# Patient Record
Sex: Male | Born: 1935
Health system: Southern US, Community
[De-identification: ages and names within clinical notes are randomized; demographics above are authoritative.]

## PROBLEM LIST (undated history)

## (undated) DIAGNOSIS — L821 Other seborrheic keratosis: Secondary | ICD-10-CM

## (undated) DIAGNOSIS — D649 Anemia, unspecified: Secondary | ICD-10-CM

## (undated) DIAGNOSIS — C801 Malignant (primary) neoplasm, unspecified: Secondary | ICD-10-CM

## (undated) DIAGNOSIS — D75839 Thrombocytosis, unspecified: Secondary | ICD-10-CM

## (undated) DIAGNOSIS — Z9289 Personal history of other medical treatment: Secondary | ICD-10-CM

## (undated) DIAGNOSIS — F341 Dysthymic disorder: Secondary | ICD-10-CM

## (undated) DIAGNOSIS — R930 Abnormal findings on diagnostic imaging of skull and head, not elsewhere classified: Secondary | ICD-10-CM

## (undated) DIAGNOSIS — D473 Essential (hemorrhagic) thrombocythemia: Secondary | ICD-10-CM

## (undated) DIAGNOSIS — I10 Essential (primary) hypertension: Secondary | ICD-10-CM

## (undated) DIAGNOSIS — T7840XA Allergy, unspecified, initial encounter: Secondary | ICD-10-CM

## (undated) DIAGNOSIS — H3530 Unspecified macular degeneration: Secondary | ICD-10-CM

## (undated) DIAGNOSIS — R519 Headache, unspecified: Secondary | ICD-10-CM

## (undated) HISTORY — PX: VARICOSE VEIN SURGERY: SHX832

## (undated) HISTORY — DX: Thrombocytosis, unspecified: D75.839

## (undated) HISTORY — DX: Anemia, unspecified: D64.9

## (undated) HISTORY — DX: Dysthymic disorder: F34.1

## (undated) HISTORY — PX: TONSILLECTOMY: SUR1361

## (undated) HISTORY — DX: Other seborrheic keratosis: L82.1

## (undated) HISTORY — DX: Abnormal findings on diagnostic imaging of skull and head, not elsewhere classified: R93.0

## (undated) HISTORY — DX: Essential (hemorrhagic) thrombocythemia: D47.3

## (undated) HISTORY — DX: Essential (primary) hypertension: I10

## (undated) HISTORY — DX: Personal history of other medical treatment: Z92.89

## (undated) HISTORY — DX: Allergy, unspecified, initial encounter: T78.40XA

## (undated) HISTORY — PX: EYE SURGERY: SHX253

## (undated) HISTORY — PX: HERNIA REPAIR: SHX51

---

## 1998-02-28 ENCOUNTER — Ambulatory Visit (HOSPITAL_BASED_OUTPATIENT_CLINIC_OR_DEPARTMENT_OTHER): Admission: RE | Admit: 1998-02-28 | Discharge: 1998-02-28 | Payer: Self-pay | Admitting: *Deleted

## 1998-03-05 ENCOUNTER — Ambulatory Visit (HOSPITAL_BASED_OUTPATIENT_CLINIC_OR_DEPARTMENT_OTHER): Admission: RE | Admit: 1998-03-05 | Discharge: 1998-03-05 | Payer: Self-pay | Admitting: *Deleted

## 2002-05-15 ENCOUNTER — Encounter (INDEPENDENT_AMBULATORY_CARE_PROVIDER_SITE_OTHER): Payer: Self-pay

## 2002-05-15 ENCOUNTER — Ambulatory Visit (HOSPITAL_COMMUNITY): Admission: RE | Admit: 2002-05-15 | Discharge: 2002-05-15 | Payer: Self-pay | Admitting: Oncology

## 2003-06-04 ENCOUNTER — Ambulatory Visit (HOSPITAL_COMMUNITY): Admission: RE | Admit: 2003-06-04 | Discharge: 2003-06-04 | Payer: Self-pay | Admitting: Plastic Surgery

## 2003-06-04 ENCOUNTER — Emergency Department (HOSPITAL_COMMUNITY): Admission: EM | Admit: 2003-06-04 | Discharge: 2003-06-04 | Payer: Self-pay | Admitting: Emergency Medicine

## 2003-06-04 ENCOUNTER — Encounter (INDEPENDENT_AMBULATORY_CARE_PROVIDER_SITE_OTHER): Payer: Self-pay | Admitting: Specialist

## 2003-06-04 ENCOUNTER — Ambulatory Visit (HOSPITAL_BASED_OUTPATIENT_CLINIC_OR_DEPARTMENT_OTHER): Admission: RE | Admit: 2003-06-04 | Discharge: 2003-06-04 | Payer: Self-pay | Admitting: Plastic Surgery

## 2004-04-27 ENCOUNTER — Ambulatory Visit: Payer: Self-pay | Admitting: Oncology

## 2004-07-27 ENCOUNTER — Ambulatory Visit: Payer: Self-pay | Admitting: Oncology

## 2004-10-26 ENCOUNTER — Ambulatory Visit: Payer: Self-pay | Admitting: Oncology

## 2005-01-25 ENCOUNTER — Ambulatory Visit: Payer: Self-pay | Admitting: Oncology

## 2005-04-26 ENCOUNTER — Ambulatory Visit: Payer: Self-pay | Admitting: Oncology

## 2005-07-26 ENCOUNTER — Ambulatory Visit: Payer: Self-pay | Admitting: Oncology

## 2005-10-22 ENCOUNTER — Ambulatory Visit: Payer: Self-pay | Admitting: Oncology

## 2005-10-26 LAB — CBC WITH DIFFERENTIAL/PLATELET
Basophils Absolute: 0 10*3/uL (ref 0.0–0.1)
Eosinophils Absolute: 0.1 10*3/uL (ref 0.0–0.5)
HGB: 11.8 g/dL — ABNORMAL LOW (ref 13.0–17.1)
MCV: 94.7 fL (ref 81.6–98.0)
MONO%: 9.3 % (ref 0.0–13.0)
NEUT#: 4.2 10*3/uL (ref 1.5–6.5)
RBC: 3.64 10*6/uL — ABNORMAL LOW (ref 4.20–5.71)
RDW: 17.5 % — ABNORMAL HIGH (ref 11.2–14.6)
WBC: 6.6 10*3/uL (ref 4.0–10.0)
lymph#: 1.7 10*3/uL (ref 0.9–3.3)

## 2006-01-17 ENCOUNTER — Ambulatory Visit: Payer: Self-pay | Admitting: Family Medicine

## 2006-01-20 ENCOUNTER — Ambulatory Visit: Payer: Self-pay | Admitting: Oncology

## 2006-01-25 LAB — CBC WITH DIFFERENTIAL/PLATELET
BASO%: 0.3 % (ref 0.0–2.0)
Basophils Absolute: 0 10*3/uL (ref 0.0–0.1)
EOS%: 1 % (ref 0.0–7.0)
HGB: 12.7 g/dL — ABNORMAL LOW (ref 13.0–17.1)
MCH: 32.7 pg (ref 28.0–33.4)
MCHC: 34.2 g/dL (ref 32.0–35.9)
MCV: 95.7 fL (ref 81.6–98.0)
MONO%: 5.4 % (ref 0.0–13.0)
RBC: 3.89 10*6/uL — ABNORMAL LOW (ref 4.20–5.71)
RDW: 17.7 % — ABNORMAL HIGH (ref 11.2–14.6)
lymph#: 1.9 10*3/uL (ref 0.9–3.3)

## 2006-02-17 ENCOUNTER — Ambulatory Visit: Payer: Self-pay | Admitting: Family Medicine

## 2006-02-26 DIAGNOSIS — Z9289 Personal history of other medical treatment: Secondary | ICD-10-CM

## 2006-02-26 HISTORY — DX: Personal history of other medical treatment: Z92.89

## 2006-04-20 ENCOUNTER — Ambulatory Visit: Payer: Self-pay | Admitting: Family Medicine

## 2006-04-22 ENCOUNTER — Ambulatory Visit: Payer: Self-pay | Admitting: Oncology

## 2006-04-26 LAB — CBC WITH DIFFERENTIAL/PLATELET
BASO%: 0.4 % (ref 0.0–2.0)
Basophils Absolute: 0 10*3/uL (ref 0.0–0.1)
EOS%: 1.5 % (ref 0.0–7.0)
HCT: 36 % — ABNORMAL LOW (ref 38.7–49.9)
HGB: 12.2 g/dL — ABNORMAL LOW (ref 13.0–17.1)
LYMPH%: 20.5 % (ref 14.0–48.0)
MCH: 32.6 pg (ref 28.0–33.4)
MCHC: 33.9 g/dL (ref 32.0–35.9)
MCV: 96.2 fL (ref 81.6–98.0)
MONO%: 8 % (ref 0.0–13.0)
NEUT%: 69.6 % (ref 40.0–75.0)
lymph#: 1.5 10*3/uL (ref 0.9–3.3)

## 2006-07-21 ENCOUNTER — Ambulatory Visit: Payer: Self-pay | Admitting: Oncology

## 2006-07-26 LAB — CBC WITH DIFFERENTIAL/PLATELET
Basophils Absolute: 0 10*3/uL (ref 0.0–0.1)
Eosinophils Absolute: 0.1 10*3/uL (ref 0.0–0.5)
HGB: 12.1 g/dL — ABNORMAL LOW (ref 13.0–17.1)
MCV: 94 fL (ref 81.6–98.0)
MONO#: 0.7 10*3/uL (ref 0.1–0.9)
NEUT#: 5.3 10*3/uL (ref 1.5–6.5)
Platelets: 210 10*3/uL (ref 145–400)
RBC: 3.66 10*6/uL — ABNORMAL LOW (ref 4.20–5.71)
RDW: 17.7 % — ABNORMAL HIGH (ref 11.2–14.6)
WBC: 7.8 10*3/uL (ref 4.0–10.0)

## 2006-07-28 ENCOUNTER — Emergency Department (HOSPITAL_COMMUNITY): Admission: EM | Admit: 2006-07-28 | Discharge: 2006-07-28 | Payer: Self-pay | Admitting: Family Medicine

## 2006-09-08 ENCOUNTER — Ambulatory Visit: Payer: Self-pay | Admitting: Family Medicine

## 2006-10-03 ENCOUNTER — Ambulatory Visit: Payer: Self-pay | Admitting: Family Medicine

## 2006-10-04 DIAGNOSIS — R930 Abnormal findings on diagnostic imaging of skull and head, not elsewhere classified: Secondary | ICD-10-CM

## 2006-10-04 HISTORY — DX: Abnormal findings on diagnostic imaging of skull and head, not elsewhere classified: R93.0

## 2006-10-21 ENCOUNTER — Ambulatory Visit: Payer: Self-pay | Admitting: Oncology

## 2006-10-25 LAB — CBC WITH DIFFERENTIAL/PLATELET
Basophils Absolute: 0 10*3/uL (ref 0.0–0.1)
Eosinophils Absolute: 0.1 10*3/uL (ref 0.0–0.5)
HCT: 33.7 % — ABNORMAL LOW (ref 38.7–49.9)
LYMPH%: 24.5 % (ref 14.0–48.0)
MCV: 95.1 fL (ref 81.6–98.0)
MONO#: 0.5 10*3/uL (ref 0.1–0.9)
MONO%: 7.3 % (ref 0.0–13.0)
NEUT#: 4.8 10*3/uL (ref 1.5–6.5)
NEUT%: 65.6 % (ref 40.0–75.0)
Platelets: 163 10*3/uL (ref 145–400)
WBC: 7.3 10*3/uL (ref 4.0–10.0)

## 2006-12-13 DIAGNOSIS — Z9289 Personal history of other medical treatment: Secondary | ICD-10-CM

## 2006-12-13 HISTORY — DX: Personal history of other medical treatment: Z92.89

## 2006-12-26 ENCOUNTER — Ambulatory Visit: Payer: Self-pay | Admitting: Family Medicine

## 2007-01-04 ENCOUNTER — Ambulatory Visit (HOSPITAL_COMMUNITY): Admission: RE | Admit: 2007-01-04 | Discharge: 2007-01-04 | Payer: Self-pay | Admitting: Family Medicine

## 2007-01-28 ENCOUNTER — Ambulatory Visit: Payer: Self-pay | Admitting: Oncology

## 2007-02-01 LAB — CBC WITH DIFFERENTIAL/PLATELET
BASO%: 0.6 % (ref 0.0–2.0)
EOS%: 2.7 % (ref 0.0–7.0)
HCT: 35.4 % — ABNORMAL LOW (ref 38.7–49.9)
LYMPH%: 24.4 % (ref 14.0–48.0)
MCH: 33.1 pg (ref 28.0–33.4)
MCHC: 35 g/dL (ref 32.0–35.9)
MCV: 94.4 fL (ref 81.6–98.0)
MONO#: 0.6 10*3/uL (ref 0.1–0.9)
MONO%: 8.7 % (ref 0.0–13.0)
NEUT%: 63.6 % (ref 40.0–75.0)
Platelets: 146 10*3/uL (ref 145–400)

## 2007-04-05 ENCOUNTER — Ambulatory Visit: Payer: Self-pay | Admitting: Family Medicine

## 2007-04-21 ENCOUNTER — Ambulatory Visit: Payer: Self-pay | Admitting: Oncology

## 2007-04-25 LAB — CBC WITH DIFFERENTIAL/PLATELET
BASO%: 0.4 % (ref 0.0–2.0)
HCT: 33.7 % — ABNORMAL LOW (ref 38.7–49.9)
MCHC: 34.8 g/dL (ref 32.0–35.9)
MONO#: 0.7 10*3/uL (ref 0.1–0.9)
NEUT%: 64.5 % (ref 40.0–75.0)
WBC: 7.3 10*3/uL (ref 4.0–10.0)
lymph#: 1.8 10*3/uL (ref 0.9–3.3)

## 2007-05-25 LAB — CBC WITH DIFFERENTIAL/PLATELET
Basophils Absolute: 0 10*3/uL (ref 0.0–0.1)
Eosinophils Absolute: 0.1 10*3/uL (ref 0.0–0.5)
HCT: 33 % — ABNORMAL LOW (ref 38.7–49.9)
HGB: 11.5 g/dL — ABNORMAL LOW (ref 13.0–17.1)
MONO#: 0.6 10*3/uL (ref 0.1–0.9)
NEUT#: 5 10*3/uL (ref 1.5–6.5)
NEUT%: 66.5 % (ref 40.0–75.0)
WBC: 7.5 10*3/uL (ref 4.0–10.0)
lymph#: 1.8 10*3/uL (ref 0.9–3.3)

## 2007-06-21 ENCOUNTER — Ambulatory Visit: Payer: Self-pay | Admitting: Oncology

## 2007-06-23 LAB — CBC WITH DIFFERENTIAL/PLATELET
Basophils Absolute: 0 10*3/uL (ref 0.0–0.1)
EOS%: 1.5 % (ref 0.0–7.0)
Eosinophils Absolute: 0.2 10*3/uL (ref 0.0–0.5)
HCT: 36.1 % — ABNORMAL LOW (ref 38.7–49.9)
HGB: 12.3 g/dL — ABNORMAL LOW (ref 13.0–17.1)
MCH: 32 pg (ref 28.0–33.4)
MCV: 94.2 fL (ref 81.6–98.0)
MONO%: 9.5 % (ref 0.0–13.0)
NEUT%: 68.5 % (ref 40.0–75.0)

## 2007-07-03 ENCOUNTER — Ambulatory Visit: Payer: Self-pay | Admitting: Family Medicine

## 2007-07-25 LAB — CBC WITH DIFFERENTIAL/PLATELET
Basophils Absolute: 0 10*3/uL (ref 0.0–0.1)
HCT: 34 % — ABNORMAL LOW (ref 38.7–49.9)
HGB: 11.7 g/dL — ABNORMAL LOW (ref 13.0–17.1)
LYMPH%: 20.5 % (ref 14.0–48.0)
MCHC: 34.5 g/dL (ref 32.0–35.9)
MONO#: 0.7 10*3/uL (ref 0.1–0.9)
NEUT%: 68.5 % (ref 40.0–75.0)
Platelets: 360 10*3/uL (ref 145–400)
WBC: 8.5 10*3/uL (ref 4.0–10.0)
lymph#: 1.7 10*3/uL (ref 0.9–3.3)

## 2007-08-23 ENCOUNTER — Ambulatory Visit: Payer: Self-pay | Admitting: Family Medicine

## 2007-10-20 ENCOUNTER — Ambulatory Visit: Payer: Self-pay | Admitting: Oncology

## 2007-10-24 LAB — CBC WITH DIFFERENTIAL/PLATELET
BASO%: 0.4 % (ref 0.0–2.0)
Eosinophils Absolute: 0.2 10*3/uL (ref 0.0–0.5)
HCT: 34.9 % — ABNORMAL LOW (ref 38.7–49.9)
LYMPH%: 13.2 % — ABNORMAL LOW (ref 14.0–48.0)
MCHC: 34.8 g/dL (ref 32.0–35.9)
MONO#: 0.9 10*3/uL (ref 0.1–0.9)
NEUT#: 9.5 10*3/uL — ABNORMAL HIGH (ref 1.5–6.5)
NEUT%: 77.3 % — ABNORMAL HIGH (ref 40.0–75.0)
Platelets: 421 10*3/uL — ABNORMAL HIGH (ref 145–400)
WBC: 12.3 10*3/uL — ABNORMAL HIGH (ref 4.0–10.0)
lymph#: 1.6 10*3/uL (ref 0.9–3.3)

## 2007-11-16 ENCOUNTER — Ambulatory Visit: Payer: Self-pay | Admitting: Family Medicine

## 2007-11-20 ENCOUNTER — Ambulatory Visit: Payer: Self-pay | Admitting: Family Medicine

## 2008-01-25 ENCOUNTER — Ambulatory Visit: Payer: Self-pay | Admitting: Oncology

## 2008-01-29 LAB — CBC WITH DIFFERENTIAL/PLATELET
Basophils Absolute: 0.1 10*3/uL (ref 0.0–0.1)
EOS%: 0.9 % (ref 0.0–7.0)
Eosinophils Absolute: 0.1 10*3/uL (ref 0.0–0.5)
HCT: 36.5 % — ABNORMAL LOW (ref 38.7–49.9)
HGB: 12.5 g/dL — ABNORMAL LOW (ref 13.0–17.1)
LYMPH%: 19.5 % (ref 14.0–48.0)
MCH: 32.5 pg (ref 28.0–33.4)
MCV: 95.3 fL (ref 81.6–98.0)
MONO%: 9.6 % (ref 0.0–13.0)
NEUT#: 5.8 10*3/uL (ref 1.5–6.5)
NEUT%: 69.4 % (ref 40.0–75.0)
Platelets: 342 10*3/uL (ref 145–400)

## 2008-01-29 LAB — MORPHOLOGY

## 2008-03-26 ENCOUNTER — Ambulatory Visit: Payer: Self-pay | Admitting: Family Medicine

## 2008-04-22 ENCOUNTER — Ambulatory Visit: Payer: Self-pay | Admitting: Oncology

## 2008-04-24 LAB — CBC WITH DIFFERENTIAL/PLATELET
BASO%: 0.7 % (ref 0.0–2.0)
EOS%: 1.8 % (ref 0.0–7.0)
MCH: 33.8 pg — ABNORMAL HIGH (ref 28.0–33.4)
MCHC: 34.8 g/dL (ref 32.0–35.9)
MCV: 97 fL (ref 81.6–98.0)
MONO%: 7.4 % (ref 0.0–13.0)
NEUT#: 5.9 10*3/uL (ref 1.5–6.5)
RBC: 3.79 10*6/uL — ABNORMAL LOW (ref 4.20–5.71)
RDW: 16.8 % — ABNORMAL HIGH (ref 11.2–14.6)

## 2008-04-24 LAB — MORPHOLOGY: PLT EST: ADEQUATE

## 2008-07-26 ENCOUNTER — Ambulatory Visit: Payer: Self-pay | Admitting: Oncology

## 2008-07-30 LAB — MORPHOLOGY: PLT EST: ADEQUATE

## 2008-07-30 LAB — CBC WITH DIFFERENTIAL/PLATELET
Basophils Absolute: 0.1 10*3/uL (ref 0.0–0.1)
EOS%: 1.6 % (ref 0.0–7.0)
HGB: 12.4 g/dL — ABNORMAL LOW (ref 13.0–17.1)
MCH: 33.5 pg — ABNORMAL HIGH (ref 28.0–33.4)
NEUT#: 6.7 10*3/uL — ABNORMAL HIGH (ref 1.5–6.5)
RDW: 17.1 % — ABNORMAL HIGH (ref 11.2–14.6)
lymph#: 1.7 10*3/uL (ref 0.9–3.3)

## 2008-10-21 ENCOUNTER — Ambulatory Visit: Payer: Self-pay | Admitting: Oncology

## 2008-10-23 LAB — CBC WITH DIFFERENTIAL/PLATELET
Basophils Absolute: 0.1 10*3/uL (ref 0.0–0.1)
Eosinophils Absolute: 0.2 10*3/uL (ref 0.0–0.5)
HCT: 36.2 % — ABNORMAL LOW (ref 38.4–49.9)
HGB: 12.2 g/dL — ABNORMAL LOW (ref 13.0–17.1)
LYMPH%: 21.4 % (ref 14.0–49.0)
MONO#: 0.7 10*3/uL (ref 0.1–0.9)
NEUT#: 5.8 10*3/uL (ref 1.5–6.5)
NEUT%: 67.7 % (ref 39.0–75.0)
Platelets: 403 10*3/uL — ABNORMAL HIGH (ref 140–400)
WBC: 8.6 10*3/uL (ref 4.0–10.3)

## 2008-10-23 LAB — MORPHOLOGY: PLT EST: INCREASED

## 2009-01-17 ENCOUNTER — Ambulatory Visit: Payer: Self-pay | Admitting: Oncology

## 2009-01-21 LAB — CBC WITH DIFFERENTIAL/PLATELET
BASO%: 0.5 % (ref 0.0–2.0)
EOS%: 1.8 % (ref 0.0–7.0)
HCT: 35.9 % — ABNORMAL LOW (ref 38.4–49.9)
LYMPH%: 19.4 % (ref 14.0–49.0)
MCH: 33.3 pg (ref 27.2–33.4)
MCHC: 34.2 g/dL (ref 32.0–36.0)
NEUT%: 71.7 % (ref 39.0–75.0)
Platelets: 408 10*3/uL — ABNORMAL HIGH (ref 140–400)

## 2009-01-21 LAB — MORPHOLOGY: PLT EST: ADEQUATE

## 2009-03-04 ENCOUNTER — Ambulatory Visit: Payer: Self-pay | Admitting: Family Medicine

## 2009-04-11 ENCOUNTER — Ambulatory Visit: Payer: Self-pay | Admitting: Oncology

## 2009-04-21 LAB — CBC WITH DIFFERENTIAL/PLATELET
BASO%: 0.6 % (ref 0.0–2.0)
EOS%: 1.1 % (ref 0.0–7.0)
HGB: 12.3 g/dL — ABNORMAL LOW (ref 13.0–17.1)
MCH: 33 pg (ref 27.2–33.4)
MCHC: 33.9 g/dL (ref 32.0–36.0)
RDW: 17.4 % — ABNORMAL HIGH (ref 11.0–14.6)
lymph#: 1.6 10*3/uL (ref 0.9–3.3)

## 2009-04-21 LAB — MORPHOLOGY

## 2009-04-24 ENCOUNTER — Ambulatory Visit: Payer: Self-pay | Admitting: Family Medicine

## 2009-07-08 ENCOUNTER — Ambulatory Visit: Payer: Self-pay | Admitting: Oncology

## 2009-07-08 LAB — MORPHOLOGY: PLT EST: ADEQUATE

## 2009-07-08 LAB — CBC WITH DIFFERENTIAL/PLATELET
Basophils Absolute: 0 10*3/uL (ref 0.0–0.1)
Eosinophils Absolute: 0.1 10*3/uL (ref 0.0–0.5)
HCT: 36.6 % — ABNORMAL LOW (ref 38.4–49.9)
HGB: 12.6 g/dL — ABNORMAL LOW (ref 13.0–17.1)
MCV: 98.6 fL — ABNORMAL HIGH (ref 79.3–98.0)
MONO%: 7.3 % (ref 0.0–14.0)
NEUT#: 6.8 10*3/uL — ABNORMAL HIGH (ref 1.5–6.5)
NEUT%: 72.8 % (ref 39.0–75.0)
Platelets: 471 10*3/uL — ABNORMAL HIGH (ref 140–400)
RDW: 17.3 % — ABNORMAL HIGH (ref 11.0–14.6)

## 2009-08-12 ENCOUNTER — Ambulatory Visit: Payer: Self-pay | Admitting: Oncology

## 2009-08-12 LAB — CBC WITH DIFFERENTIAL/PLATELET
BASO%: 0.5 % (ref 0.0–2.0)
Basophils Absolute: 0 10*3/uL (ref 0.0–0.1)
EOS%: 1 % (ref 0.0–7.0)
HCT: 36.2 % — ABNORMAL LOW (ref 38.4–49.9)
LYMPH%: 19.3 % (ref 14.0–49.0)
MCH: 33.4 pg (ref 27.2–33.4)
MCHC: 34 g/dL (ref 32.0–36.0)
MCV: 98.1 fL — ABNORMAL HIGH (ref 79.3–98.0)
NEUT%: 70.5 % (ref 39.0–75.0)
Platelets: 457 10*3/uL — ABNORMAL HIGH (ref 140–400)

## 2009-09-19 ENCOUNTER — Ambulatory Visit: Payer: Self-pay | Admitting: Oncology

## 2009-10-21 ENCOUNTER — Ambulatory Visit: Payer: Self-pay | Admitting: Family Medicine

## 2009-11-14 ENCOUNTER — Ambulatory Visit: Payer: Self-pay | Admitting: Oncology

## 2009-11-17 LAB — CBC WITH DIFFERENTIAL/PLATELET
Eosinophils Absolute: 0.1 10*3/uL (ref 0.0–0.5)
LYMPH%: 21.7 % (ref 14.0–49.0)
MONO#: 0.5 10*3/uL (ref 0.1–0.9)
NEUT#: 5.6 10*3/uL (ref 1.5–6.5)
Platelets: 448 10*3/uL — ABNORMAL HIGH (ref 140–400)
RBC: 3.55 10*6/uL — ABNORMAL LOW (ref 4.20–5.82)
RDW: 17.8 % — ABNORMAL HIGH (ref 11.0–14.6)
WBC: 7.9 10*3/uL (ref 4.0–10.3)
lymph#: 1.7 10*3/uL (ref 0.9–3.3)

## 2009-11-17 LAB — MORPHOLOGY

## 2010-03-12 ENCOUNTER — Ambulatory Visit: Payer: Self-pay | Admitting: Family Medicine

## 2010-03-12 ENCOUNTER — Ambulatory Visit (HOSPITAL_BASED_OUTPATIENT_CLINIC_OR_DEPARTMENT_OTHER): Payer: Medicare Other | Admitting: Oncology

## 2010-03-16 LAB — CBC & DIFF AND RETIC
EOS%: 1.5 % (ref 0.0–7.0)
Eosinophils Absolute: 0.2 10*3/uL (ref 0.0–0.5)
MCH: 31.4 pg (ref 27.2–33.4)
MCV: 97.7 fL (ref 79.3–98.0)
MONO%: 11.1 % (ref 0.0–14.0)
NEUT#: 7 10*3/uL — ABNORMAL HIGH (ref 1.5–6.5)
RBC: 3.5 10*6/uL — ABNORMAL LOW (ref 4.20–5.82)
RDW: 17.2 % — ABNORMAL HIGH (ref 11.0–14.6)
Retic %: 1.63 % — ABNORMAL HIGH (ref 0.50–1.60)
Retic Ct Abs: 57.05 10*3/uL (ref 24.10–77.50)

## 2010-04-24 ENCOUNTER — Ambulatory Visit: Payer: Self-pay | Admitting: Family Medicine

## 2010-07-20 ENCOUNTER — Encounter: Payer: Medicare Other | Admitting: Oncology

## 2010-07-20 DIAGNOSIS — D473 Essential (hemorrhagic) thrombocythemia: Secondary | ICD-10-CM

## 2010-07-20 LAB — MORPHOLOGY: PLT EST: INCREASED

## 2010-07-20 LAB — CBC WITH DIFFERENTIAL/PLATELET
Basophils Absolute: 0 10*3/uL (ref 0.0–0.1)
EOS%: 1.5 % (ref 0.0–7.0)
Eosinophils Absolute: 0.2 10*3/uL (ref 0.0–0.5)
HGB: 11.2 g/dL — ABNORMAL LOW (ref 13.0–17.1)
NEUT#: 7.8 10*3/uL — ABNORMAL HIGH (ref 1.5–6.5)
RDW: 18.6 % — ABNORMAL HIGH (ref 11.0–14.6)
lymph#: 2 10*3/uL (ref 0.9–3.3)

## 2010-10-21 ENCOUNTER — Ambulatory Visit (INDEPENDENT_AMBULATORY_CARE_PROVIDER_SITE_OTHER): Payer: Medicare Other | Admitting: Medical

## 2010-10-21 ENCOUNTER — Encounter: Payer: Self-pay | Admitting: Medical

## 2010-10-21 VITALS — BP 150/82 | HR 80 | Ht 76.0 in | Wt 198.0 lb

## 2010-10-21 DIAGNOSIS — F341 Dysthymic disorder: Secondary | ICD-10-CM

## 2010-10-21 DIAGNOSIS — I10 Essential (primary) hypertension: Secondary | ICD-10-CM

## 2010-10-21 DIAGNOSIS — H9190 Unspecified hearing loss, unspecified ear: Secondary | ICD-10-CM

## 2010-10-21 DIAGNOSIS — H6120 Impacted cerumen, unspecified ear: Secondary | ICD-10-CM

## 2010-10-21 DIAGNOSIS — M25559 Pain in unspecified hip: Secondary | ICD-10-CM

## 2010-10-21 DIAGNOSIS — M25551 Pain in right hip: Secondary | ICD-10-CM

## 2010-10-21 MED ORDER — FLUOXETINE HCL 40 MG PO CAPS: 40.0000 mg | ORAL_CAPSULE | Freq: Every day | ORAL | Status: DC

## 2010-10-21 NOTE — Patient Instructions (Addendum)
Hearing decreased - mild.  If this worsens, may need to consider hearing specialist.  We removed your ear wax today.  Continue your medications as you have been doing.    I started you back on Fluoxetine/Prozac today.  However, I want you to take 1/2 tablet at bedtime for 1 week, then 1 tablet at bedtime every day.  If you need to stop this medication for whatever reason, call or come in so we can taper you off this medication.    Hip pain - try taking Tylenol Arthritis 650 mg Over the Counter, twice daily or up to 3 times daily for hip pain/arthritis.  I want to see you back in 3 weeks to make sure the medication is helping and to recheck in general.

## 2010-10-21 NOTE — Progress Notes (Signed)
Subjective:    Patient ID: Nicholas [name], male    DOB: 10-17-1927, 77 y.o.   MRN: [id number]  HPI Here today, he notes, at the request of his family.   He has been having some hearing decreased, thinks its related to wax buildup.  He has had to have wax removed in the past.  Worse on the left.  He also notes that family wants him to go back on Prozac.  He has been on this for years, but stopped taking it 82mo ago.  He says he didn't really notice any difference, and at times it made him feel funny.  He had been taking it in the morning.   His daughter, [name], who works in the front office here, notes that he seemed to do well on Prozac.  He is agreeable to restarting this for mood.  He sees hematology regularly, is compliant with all his other medications.    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  He also notes chronic ongoing hip pain on both sides, worse in the evenings and with activity, thinks its arthritis.  Has been seen here before with xrays for this compliant.   He also notes ongoing chronic sinus headaches for years.  Has hx/o seeing ENT without success.  Had prior head imaging a few years back.   No other c/o.    Review of Systems  Constitutional: Negative for fever, chills, activity change, appetite change, fatigue and unexpected weight change.  HENT: Positive for hearing loss and sinus pressure. Negative for ear pain, nosebleeds, congestion, sore throat, rhinorrhea, sneezing, trouble swallowing, neck pain, dental problem, tinnitus and ear discharge.   Eyes: Negative for pain, discharge, itching and visual disturbance.  Respiratory: Negative for cough, shortness of breath and wheezing.   Cardiovascular: Negative for chest pain, palpitations and leg swelling.  Gastrointestinal: Negative for nausea, vomiting, abdominal pain, diarrhea and constipation.  Genitourinary: Negative for dysuria.  Musculoskeletal: Positive for arthralgias. Negative for myalgias, back pain,  joint swelling and gait problem.  Skin: Negative for rash.  Neurological: Positive for headaches. Negative for dizziness, seizures, weakness and numbness.  Hematological: Does not bruise/bleed easily.  Psychiatric/Behavioral: Negative for behavioral problems, confusion, dysphoric mood, decreased concentration and agitation.       Objective:   Physical Exam  Constitutional: He is oriented to person, place, and time. He appears well-developed and well-nourished. No distress.  HENT:  Head: Normocephalic.  Nose: No mucosal edema or rhinorrhea. Right sinus exhibits no maxillary sinus tenderness and no frontal sinus tenderness. Left sinus exhibits no maxillary sinus tenderness and no frontal sinus tenderness.  Mouth/Throat: Oropharynx is clear and moist and mucous membranes are normal. No posterior oropharyngeal erythema.       Mild to moderate cerumen in ear canals, worse on the left  Eyes: Conjunctivae and EOM are normal. Pupils are equal, round, and reactive to light.  Neck: Normal range of motion. No JVD present. No thyromegaly present.  Cardiovascular: Normal rate, regular rhythm, normal heart sounds and intact distal pulses.   No murmur heard. Pulmonary/Chest: Effort normal and breath sounds normal. He has no wheezes. He has no rales.  Abdominal: Soft. There is no tenderness.  Lymphadenopathy:    He has no cervical adenopathy.  Neurological: He is alert and oriented to person, place, and time.  Skin: Skin is warm and dry.  Psychiatric: He has a normal mood and affect. His behavior is normal. Judgment and thought content normal.  Assessment & Plan:   Encounter Diagnoses  Name Primary?  . Hearing decreased Yes  . Impacted cerumen   . Essential hypertension, benign   . Dysthymia   . Hip pain, bilateral     Hearing decreased - mild.  If this worsens, may need to consider hearing specialist.  We removed your ear wax today.  Continue your medications as you have been  doing.    I started you back on Fluoxetine/Prozac today.  However, I want you to take 1/2 tablet at bedtime for 1 week, then 1 tablet at bedtime every day.  If you need to stop this medication for whatever reason, call or come in so we can taper you off this medication.    Hip pain - try taking Tylenol Arthritis 650 mg Over the Counter, twice daily or up to 3 times daily for hip pain/arthritis.  I want to see you back in 3 weeks to make sure the medication is helping and to recheck in general.

## 2010-10-30 NOTE — Op Note (Signed)
Nicholas [name], Nicholas [name]                          ACCOUNT NO.:  [id number]   MEDICAL RECORD NO.:  [id number]                   PATIENT TYPE:  AMB   LOCATION:  [facility]                                  FACILITY:  [facility]   PHYSICIAN:  Aretha Parrot., M.D.         DATE OF BIRTH:  31-May-1928   DATE OF PROCEDURE:  06/04/2003  DATE OF DISCHARGE:                                 OPERATIVE REPORT   PREOPERATIVE DIAGNOSIS:  Recurrent squamous cell carcinoma, left forearm  dorsal middle aspect.   POSTOPERATIVE DIAGNOSIS:  Recurrent squamous cell carcinoma, left forearm  dorsal middle aspect.   OPERATION:  Closure of 5 cm in length defect following wide local excision  of 2 cm lesion.   SURGEON:  Erline Hau, M.D.   ANESTHESIA:  Xylocaine 2% with 1:100,000 epinephrine.   FINDINGS:  The patient had approximately 2 cm in diameter lesion of his left  forearm which had been biopsied on two occasions and it was recurrent  clinically.  The above surgical procedure was carried out and the wound  closed in layers.   DESCRIPTION OF PROCEDURE:  The patient was brought to the operating room,  marked off the planned elliptical excision of the lesion trying to obtain  adequate margins.  He was prepped with Betadine and draped sterilely.  He  was anesthetized with Xylocaine 2% with 1:100,000 epinephrine.  Following  the onset of anesthesia, the excisional full thickness biopsy was performed.  The lesion was removed with underlying subcutaneous tissues.  Bleeding was  controlled with the eye cautery unit.  At this point, the wound was closed  with interrupted subcutaneous 4-0 Monocryl followed by running subcuticular  5-0 Monocryl.  Steri-Strips, 4 by 8s, and Ace bandage were then applied.  The patient tolerated the procedure well and was able to be discharged from  the operating room to be followed by me for outpatient suture removal in  approximately one week or before if any problems.                                          Aretha Parrot., M.D.    HH/MEDQ  D:  06/04/2003  T:  06/05/2003  Job:  [id number]

## 2010-11-06 ENCOUNTER — Encounter: Payer: Self-pay | Admitting: Medical

## 2010-11-10 ENCOUNTER — Ambulatory Visit (INDEPENDENT_AMBULATORY_CARE_PROVIDER_SITE_OTHER): Payer: Medicare Other | Admitting: Medical

## 2010-11-10 ENCOUNTER — Encounter: Payer: Self-pay | Admitting: Medical

## 2010-11-10 VITALS — BP 152/74 | HR 88 | Temp 97.7°F | Ht 75.0 in | Wt 197.0 lb

## 2010-11-10 DIAGNOSIS — F329 Major depressive disorder, single episode, unspecified: Secondary | ICD-10-CM

## 2010-11-10 DIAGNOSIS — J309 Allergic rhinitis, unspecified: Secondary | ICD-10-CM

## 2010-11-10 DIAGNOSIS — R5383 Other fatigue: Secondary | ICD-10-CM

## 2010-11-10 DIAGNOSIS — M25551 Pain in right hip: Secondary | ICD-10-CM

## 2010-11-10 DIAGNOSIS — M25559 Pain in unspecified hip: Secondary | ICD-10-CM

## 2010-11-10 DIAGNOSIS — M25552 Pain in left hip: Secondary | ICD-10-CM

## 2010-11-10 DIAGNOSIS — R5381 Other malaise: Secondary | ICD-10-CM

## 2010-11-10 NOTE — Patient Instructions (Signed)
For sinus and allergies - either take plain Mucinex (not Mucinex D) or OTC Zyrtec.    You go tomorrow for labs through Dr. Jana Hakim.  Please ask them to send a copy of their notes and labs to [place].  Otherwise, continue your regular medications as usual.

## 2010-11-10 NOTE — Progress Notes (Signed)
Subjective:    Patient ID: Nicholas [name], male    DOB: [birth year], 77 y.o.   MRN: [id number]  HPI Here today for recheck from visit on 10/21/2010.  At that time we started him back on Prozac which he is taking. He didn't really notice any change good or bad, but is not having any side effects. He denies depressed mood, irritability.  He does however complain of more fatigue in the last month, worse in the afternoon or late evening. Of note, his wife's niece is in the hospital currently for detox. She is 75 years old and apparently having some major complications, possibly close to death per patient. This is creating some excess stress for him and his family.   He has been using Tylenol arthritis for hip pain. He doesn't have to use this more than twice a day, mostly just once a day. This has helped. He notes that usually he doesn't have hip pain unless he's been doing something active for several hours, and usually the pain is worse in the evenings. He denies pain in his hip daily. He does back tomorrow to his oncologist for labs with follow up visit the first week in June. He is using over-the-counter Mucinex D for sinus issues.  Past Medical History  Diagnosis Date  . Anxiety   . Dysthymia   . Hypertension   . Allergy   . Chronic sinusitis   . Essential thrombocytosis     sees hematology    Review of Systems  Constitutional: Negative for fever, chills, activity change, appetite change, fatigue and unexpected weight change.  HENT: Positive for hearing loss and sinus pressure. Negative for ear pain, nosebleeds, congestion, sore throat, rhinorrhea, sneezing, trouble swallowing, neck pain, dental problem, tinnitus and ear discharge.   Eyes: Negative for pain, discharge, itching and visual disturbance.  Respiratory: Negative for cough, shortness of breath and wheezing.   Cardiovascular: Negative for chest pain, palpitations and leg swelling.  Gastrointestinal: Negative for nausea, vomiting,  abdominal pain, diarrhea and constipation.  Genitourinary: Negative for dysuria.  Musculoskeletal: Positive for arthralgias. Negative for myalgias, back pain, joint swelling and gait problem.  Skin: Negative for rash.  Neurological: Positive for headaches. Negative for dizziness, seizures, weakness and numbness.  Hematological: Does not bruise/bleed easily.  Psychiatric/Behavioral: Negative for behavioral problems, confusion, dysphoric mood, decreased concentration and agitation.       Objective:   Physical Exam  Constitutional: He is oriented to person, place, and time. He appears well-developed and well-nourished. No distress.  HENT:  Head: Normocephalic.  Nose: No mucosal edema or rhinorrhea. No sinus tenderness.  Mouth/Throat: Oropharynx is clear and moist and mucous membranes are normal. No posterior oropharyngeal erythema   Neck: Supple non tender no lymphadenopathy  Cardiovascular: Normal rate, regular rhythm, normal heart sounds and intact distal pulses.   No murmur heard. Pulmonary/Chest: Effort normal and breath sounds normal. He has no wheezes. He has no rales.  Musculoskeletal: Mild tenderness with bilateral hip range of motion which is mildly decreased, otherwise non tender lower extremities. Neurological: He is alert and oriented to person, place, and time.  Skin: Skin is warm and dry.  Psychiatric: He has a normal mood and affect. His behavior is normal. Judgment and thought content normal.      Assessment & Plan:   Encounter Diagnoses  Name Primary?  . Depression Yes  . Allergic rhinitis   . Hip pain, bilateral   . Fatigue    Depression-Continue Prozac, 40 mg  daily, call if any changes in mood or irritability.  Allergic rhinitis-advise he stop Mucinex D as this is likely raising his blood pressure. Instead he can use plain Mucinex or over-the-counter Zyrtec.  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 Using, arthritis as needed. No worse since  last visit.  Fatigue-advise he followup this week as planned for lab work with his oncologist. Asked him to get copies of labs and office note to us.-looking back with his last labs, he was a little bit anemic. If he continues to feel worse fatigue on Prozac we may need to change this.

## 2010-11-11 ENCOUNTER — Encounter (HOSPITAL_BASED_OUTPATIENT_CLINIC_OR_DEPARTMENT_OTHER): Payer: Medicare Other | Admitting: Oncology

## 2010-11-11 ENCOUNTER — Other Ambulatory Visit: Payer: Self-pay | Admitting: Oncology

## 2010-11-11 DIAGNOSIS — D473 Essential (hemorrhagic) thrombocythemia: Secondary | ICD-10-CM

## 2010-11-11 LAB — CBC & DIFF AND RETIC
Basophils Absolute: 0.2 10*3/uL — ABNORMAL HIGH (ref 0.0–0.1)
Eosinophils Absolute: 0.1 10*3/uL (ref 0.0–0.5)
HGB: 11.2 g/dL — ABNORMAL LOW (ref 13.0–17.1)
LYMPH%: 18.5 % (ref 14.0–49.0)
MCH: 30.7 pg (ref 27.2–33.4)
MCV: 95.9 fL (ref 79.3–98.0)
MONO%: 8.9 % (ref 0.0–14.0)
NEUT#: 8.5 10*3/uL — ABNORMAL HIGH (ref 1.5–6.5)
Platelets: 429 10*3/uL — ABNORMAL HIGH (ref 140–400)
RBC: 3.65 10*6/uL — ABNORMAL LOW (ref 4.20–5.82)

## 2010-11-24 ENCOUNTER — Encounter (HOSPITAL_BASED_OUTPATIENT_CLINIC_OR_DEPARTMENT_OTHER): Payer: Medicare Other | Admitting: Oncology

## 2010-11-24 DIAGNOSIS — D473 Essential (hemorrhagic) thrombocythemia: Secondary | ICD-10-CM

## 2010-12-17 ENCOUNTER — Ambulatory Visit: Payer: Medicare Other | Admitting: Medical

## 2011-02-19 ENCOUNTER — Ambulatory Visit (INDEPENDENT_AMBULATORY_CARE_PROVIDER_SITE_OTHER): Payer: Medicare Other | Admitting: Medical

## 2011-02-19 ENCOUNTER — Encounter: Payer: Self-pay | Admitting: Medical

## 2011-02-19 VITALS — BP 120/72 | HR 72 | Ht 75.0 in | Wt 197.0 lb

## 2011-02-19 DIAGNOSIS — S0591XA Unspecified injury of right eye and orbit, initial encounter: Secondary | ICD-10-CM

## 2011-02-19 DIAGNOSIS — S0510XA Contusion of eyeball and orbital tissues, unspecified eye, initial encounter: Secondary | ICD-10-CM

## 2011-02-19 DIAGNOSIS — H1130 Conjunctival hemorrhage, unspecified eye: Secondary | ICD-10-CM

## 2011-02-19 MED ORDER — OFLOXACIN 0.3 % OP SOLN: 1.0000 [drp] | Freq: Four times a day (QID) | OPHTHALMIC | Status: AC

## 2011-02-19 NOTE — Patient Instructions (Signed)
You can use a cool moist rag on your eye for comfort and drainage.  Stop the OTC drops you are using.  If needed, you can use OTC wetting drops for lubrication.  The redness/blood in your eye will resolve on its own over the next week or so.  If you start to have goupy, pus drainage, then begin eye drop antibiotic.  If not, then don't get the antibiotic eye drop filled.

## 2011-02-19 NOTE — Progress Notes (Signed)
Subjective:    Nicholas [name] is a 77 y.o. male who presents for evaluation of discharge, erythema and tearing in the right eye. He has noticed the above symptoms for 1 day. 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 Patient denies pain, photophobia, visual field deficit and purulent drainage.  The following portions of the patient's history were reviewed and updated as appropriate: allergies, current medications, past family history, past medical history, past social history, past surgical history and problem list.  Review of Systems Gen: no fever, chills, sweats HEENT: no st, ear pain, headache, sinus pressure GI: no nv Neuro: no numbness, tingling   Objective:    BP 120/72  Pulse 72  Ht 6\' 3"  (1.905 m)  Wt 197 lb (89.359 kg)  BMI 24.62 kg/m2      General: alert, cooperative and no distress  Eyes:  positive findings: conjunctiva: 2+ subconjunctival hemorrhage on the right, clear discharge, bilat floaters+, otherwise eyes normal exam, PERRLA, EOMi, fundi WNL  Vision: Uncorrected:            L  20/30            R 20/30  Fluorescein:  no uptake seen     Assessment:     Encounter Diagnoses  Name Primary?  . Subconjunctival hemorrhage Yes  . Trauma to eye, right      Plan:   Eye visual acuity screen 20/30 bilat, fluorescein stain without ulceration or abrasion.   Discussed nature of subconjunctival hemorrhage.  Advised the following:  Patient Instructions  You can use a cool moist rag on your eye for comfort and drainage.  Stop the OTC drops you are using.  If needed, you can use OTC wetting drops for lubrication.  The redness/blood in your eye will resolve on its own over the next week or so.  If you start to have goupy, pus drainage, then begin eye drop antibiotic.  If not, then don't get the antibiotic eye drop filled.

## 2011-03-15 ENCOUNTER — Other Ambulatory Visit: Payer: Self-pay | Admitting: Family Medicine

## 2011-03-15 NOTE — Telephone Encounter (Signed)
Left message for pt Dr.lalonde would like for him to make a med check appt

## 2011-03-15 NOTE — Telephone Encounter (Signed)
Is this ok?

## 2011-03-15 NOTE — Telephone Encounter (Signed)
He needs a med check appointment. 

## 2011-03-29 ENCOUNTER — Encounter: Payer: Self-pay | Admitting: Medical

## 2011-03-29 ENCOUNTER — Ambulatory Visit (INDEPENDENT_AMBULATORY_CARE_PROVIDER_SITE_OTHER): Payer: Medicare Other | Admitting: Medical

## 2011-03-29 VITALS — BP 142/80 | HR 62 | Temp 97.9°F | Resp 18 | Ht 74.0 in | Wt 195.0 lb

## 2011-03-29 DIAGNOSIS — Z021 Encounter for pre-employment examination: Secondary | ICD-10-CM

## 2011-03-29 DIAGNOSIS — E781 Pure hyperglyceridemia: Secondary | ICD-10-CM

## 2011-03-29 DIAGNOSIS — D473 Essential (hemorrhagic) thrombocythemia: Secondary | ICD-10-CM

## 2011-03-29 DIAGNOSIS — I10 Essential (primary) hypertension: Secondary | ICD-10-CM

## 2011-03-29 DIAGNOSIS — Z23 Encounter for immunization: Secondary | ICD-10-CM

## 2011-03-29 LAB — POCT URINALYSIS DIPSTICK
Bilirubin, UA: NEGATIVE
Glucose, UA: NEGATIVE
Nitrite, UA: NEGATIVE

## 2011-03-29 NOTE — Progress Notes (Addendum)
Subjective:   HPI  Nicholas [name] is a 77 y.o. 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    In general he is feeling fine, no particularly c/o.  He sees dermatology in December for routine f/u.  He has hx/o skin biopsies including hx/o melanoma of upper back.    He checks BP at home and is normally in the 120/70 range.  He denies any recent chest pain, DOE, palpitations.  He notes no prior heart problems.   He notes that his mood has been good.  Does well on Prozac.  No recent changes in mood or depression symptoms.   He goes for repeat labs for hematology in November.  Sees hematology for hx/o thrombocytosis.    Reviewed their medical, surgical, family, social, medication, and allergy history and updated chart as appropriate.  Past Medical History  Diagnosis Date  . Anxiety   . Dysthymia   . Hypertension   . Allergy   . Chronic sinusitis   . Essential thrombocytosis     sees hematology    Past Surgical History  Procedure Date  . Hernia repair     right inguinal repair  . Varicose vein surgery     right lower leg  . Tonsillectomy     childhood    Family History  Problem Relation Age of Onset  . Heart disease Father   . Stroke Father   . Cancer Neg Hx   . COPD Neg Hx   . Diabetes Neg Hx     History   Social History  . Marital Status: Married    Spouse Name: N/A    Number of Children: N/A  . Years of Education: N/A   Occupational History  .  [employer]    mowing yards, drives activity school bus   Social History Main Topics  . Smoking status: Former Research scientist (life sciences)  . Smokeless tobacco: Never Used  . Alcohol Use: No  . Drug Use: No  . Sexually Active: Not on file   Other Topics Concern  . Not on file   Social History Narrative   Lives at home with wife and dog.  Wife is [name].  Does some gardening.   Exercise with walking.    Current  Outpatient Prescriptions on File Prior to Visit  Medication Sig Dispense Refill  . anagrelide (AGRYLIN) 0.5 MG capsule Take 0.5 mg by mouth daily.        [name] aspirin 81 MG tablet Take 81 mg by mouth daily.        [name] FLUoxetine (PROZAC) 40 MG capsule Take 1 capsule (40 mg total) by mouth daily. Take 1/2 tablet QHS x 1wk, then 1 tablet QHS  30 capsule  11  . terazosin (HYTRIN) 2 MG capsule TAKE ONE CAPSULE BY MOUTH EVERY DAY  30 capsule  1    Allergies  Allergen Reactions  . Erythromycin      Review of Systems Constitutional: denies fever, chills, sweats, unexpected weight change, anorexia, fatigue Allergy: negative; denies recent sneezing, itching, congestion Dermatology: denies changing moles, rash, lumps, new worrisome lesions ENT: +occasional left ear tinnitus, gradual hearing loss over time ; no runny nose, ear pain, sore throat, hoarseness, sinus pain, teeth pain, epistaxis Cardiology: denies chest pain, palpitations, edema, orthopnea, paroxysmal nocturnal dyspnea Respiratory: denies cough, shortness of breath, dyspnea on exertion, wheezing, hemoptysis Gastroenterology: denies  abdominal pain, nausea, vomiting, diarrhea, constipation, blood in stool, changes in bowel movement, dysphagia Hematology: denies bleeding or bruising problems Musculoskeletal: denies arthralgias, myalgias, joint swelling, back pain, neck pain, cramping, gait changes Ophthalmology: denies vision changes, eye redness, itching, discharge Urology: denies dysuria, difficulty urinating, hematuria, urinary frequency, urgency, incontinence Neurology: no headache, weakness, tingling, numbness, speech abnormality, memory loss, falls, dizziness Psychology: denies depressed mood, agitation, sleep problems     Objective:   Physical Exam  Filed Vitals:   03/29/11 0821  BP: 142/80  Pulse: 62  Temp: 97.9 F (36.6 C)  Resp: 18    General appearance: alert, no distress, WD/WN, white male Skin: upper back with round  large surgical scar, scattered stuck on appearing brown, raised lesions throughout back, c/w seborrheic keratosis.  Bilat forearms with several crusted raised, papular lesions c/w actinic keratosis.   HEENT: normocephalic, conjunctiva/corneas normal, sclerae anicteric, PERRLA, EOMi, nares patent, no discharge or erythema, pharynx normal Oral cavity: MMM, tongue normal, teeth in good repair Neck: supple, no lymphadenopathy, no thyromegaly, no masses, normal ROM, no bruits Chest: non tender, normal shape and expansion Heart: RRR, normal S1, S2, no murmurs Lungs: CTA bilaterally, no wheezes, rhonchi, or rales Abdomen: +bs, left inguinal surgical scar, soft, non tender, non distended, no masses, no hepatomegaly, no splenomegaly, no bruits Back: non tender, normal ROM, no scoliosis Musculoskeletal: upper extremities non tender, no obvious deformity, normal ROM throughout, lower extremities non tender, no obvious deformity, normal ROM throughout Extremities: no edema, no cyanosis, no clubbing Pulses: 2+ symmetric, upper and lower extremities, normal cap refill Neurological: alert, oriented x 3, CN2-12 intact, strength normal upper extremities and lower extremities, sensation normal throughout, DTRs 2+ throughout, no cerebellar signs, gait normal Psychiatric: normal affect, behavior normal, pleasant  GU: normal male external genitalia, no mass, no hernia Rectal: anus normal, prostate somewhat enlarged, typical for age, no nodules, occult negative stool   Assessment :    Encounter Diagnoses  Name Primary?  . Essential hypertension, benign Yes  . High triglycerides   . Need for prophylactic vaccination and inoculation against influenza   . Pre-employment examination   . Thrombocytosis      Plan:    Physical exam - discussed healthy lifestyle, diet, exercise, preventative care, vaccinations, and addressed their concerns.  Labs today.   Advised he see dentist soon for routine care.  He is not  sure if he has ever had colonoscopy.  I spoke to him and daughter [name] about pros /cons of testing at this age.  He is asymptomatic.  They will discuss and let me know if they want to pursue this.    Hypertension - controlled on current medication.   Repeat BP today looks fine.  EKG today with axis 0, otherwise unremarkable EKG.  No prior EKG to compare.  High triglycerides - labs today.  Flu vaccine and VIS given today.  He will check insurer about shingles vaccine.  Per prior chart notes from 03/2003, he had pneumococcal vaccine.    Pre-employment exam - certificate granted for 1 year.  Forms completed.  Vision and hearing screen today.  Offered recheck with ENT for hearing loss/changes.  He does pass though on current guidelines.   Thrombocytosis - reviewed last hematology note.  He is due for labs in November, f/u next year in office.

## 2011-03-29 NOTE — Patient Instructions (Signed)
Call your insurance company to check on coverage/copay for shingles vaccine.  See if you can find out where your last colonoscopy was, so we can get a copy of it.  See your dentist soon for routine care.    See your dermatologist as planned.   Follow up for labs per your hematologist in late November.  Preventative Care for Adults, Male       REGULAR HEALTH EXAMS:  A routine yearly physical is a good way to check in with your primary care provider about your health and preventive screening. It is also an opportunity to share updates about your health and any concerns you have, and receive a thorough all-over exam.   Most health insurance companies pay for at least some preventative services.  Check with your health plan for specific coverages.  WHAT PREVENTATIVE SERVICES DO MEN NEED?  Adult men should have their weight and blood pressure checked regularly.   Men age 22 and older should have their cholesterol levels checked regularly.  Beginning at age 28 and continuing to age 48, men should be screened for colorectal cancer.  Certain people should may need continued testing until age 68.  Other cancer screening may include exams for testicular and prostate cancer.  Updating vaccinations is part of preventative care.  Vaccinations help protect against diseases such as the flu.  Lab tests are generally done as part of preventative care to screen for anemia and blood disorders, to screen for problems with the kidneys and liver, to screen for bladder problems, to check blood sugar, and to check your cholesterol level.  Preventative services generally include counseling about diet, exercise, avoiding tobacco, drugs, excessive alcohol consumption, and sexually transmitted infections.    GENERAL RECOMMENDATIONS FOR GOOD HEALTH:  Healthy diet:  Eat a variety of foods, including fruit, vegetables, animal or vegetable protein, such as meat, fish, chicken, and eggs, or beans, lentils,  tofu, and grains, such as rice.  Drink plenty of water daily.  Decrease saturated fat in the diet, avoid lots of red meat, processed foods, sweets, fast foods, and fried foods.  Exercise:  Aerobic exercise helps maintain good heart health. At least 30-40 minutes of moderate-intensity exercise is recommended. For example, a brisk walk that increases your heart rate and breathing. This should be done on most days of the week.   Find a type of exercise or a variety of exercises that you enjoy so that it becomes a part of your daily life.  Examples are running, walking, swimming, water aerobics, and biking.  For motivation and support, explore group exercise such as aerobic class, spin class, Zumba, Yoga,or  martial arts, etc.    Set exercise goals for yourself, such as a certain weight goal, walk or run in a race such as a 5k walk/run.  Speak to your primary care provider about exercise goals.  Disease prevention:  If you smoke or chew tobacco, find out from your caregiver how to quit. It can literally save your life, no matter how long you have been a tobacco user. If you do not use tobacco, never begin.   Maintain a healthy diet and normal weight. Increased weight leads to problems with blood pressure and diabetes.   The Body Mass Index or BMI is a way of measuring how much of your body is fat. Having a BMI above 27 increases the risk of heart disease, diabetes, hypertension, stroke and other problems related to obesity. Your caregiver can help determine your BMI and  based on it develop an exercise and dietary program to help you achieve or maintain this important measurement at a healthful level.  High blood pressure causes heart and blood vessel problems.  Persistent high blood pressure should be treated with medicine if weight loss and exercise do not work.   Fat and cholesterol leaves deposits in your arteries that can block them. This causes heart disease and vessel disease elsewhere in  your body.  If your cholesterol is found to be high, or if you have heart disease or certain other medical conditions, then you may need to have your cholesterol monitored frequently and be treated with medication.   Ask if you should have a stress test if your history suggests this. A stress test is a test done on a treadmill that looks for heart disease. This test can find disease prior to there being a problem.  Avoid drinking alcohol in excess (more than two drinks per day).  Avoid use of street drugs. Do not share needles with anyone. Ask for professional help if you need assistance or instructions on stopping the use of alcohol, cigarettes, and/or drugs.  Brush your teeth twice a day with fluoride toothpaste, and floss once a day. Good oral hygiene prevents tooth decay and gum disease. The problems can be painful, unattractive, and can cause other health problems. Visit your dentist for a routine oral and dental check up and preventive care every 6-12 months.   Look at your skin regularly.  Use a mirror to look at your back. Notify your caregivers of changes in moles, especially if there are changes in shapes, colors, a size larger than a pencil eraser, an irregular border, or development of new moles.  Safety:  Use seatbelts 100% of the time, whether driving or as a passenger.  Use safety devices such as hearing protection if you work in environments with loud noise or significant background noise.  Use safety glasses when doing any work that could send debris in to the eyes.  Use a helmet if you ride a bike or motorcycle.  Use appropriate safety gear for contact sports.  Talk to your caregiver about gun safety.  Use sunscreen with a SPF (or skin protection factor) of 15 or greater.  Lighter skinned people are at a greater risk of skin cancer. Don't forget to also wear sunglasses in order to protect your eyes from too much damaging sunlight. Damaging sunlight can accelerate cataract formation.     Practice safe sex. Use condoms. Condoms are used for birth control and to help reduce the spread of sexually transmitted infections (or STIs).  Some of the STIs are gonorrhea (the clap), chlamydia, syphilis, trichomonas, herpes, HPV (human papilloma virus) and HIV (human immunodeficiency virus) which causes AIDS. The herpes, HIV and HPV are viral illnesses that have no cure. These can result in disability, cancer and death.   Keep carbon monoxide and smoke detectors in your home functioning at all times. Change the batteries every 6 months or use a model that plugs into the wall.   Vaccinations:  Stay up to date with your tetanus shots and other required immunizations. You should have a booster for tetanus every 10 years. Be sure to get your flu shot every year, since 5%-20% of the [place] population comes down with the flu. The flu vaccine changes each year, so being vaccinated once is not enough. Get your shot in the fall, before the flu season peaks.   Other vaccines to consider:  Pneumococcal vaccine to protect against certain types of pneumonia.  This is normally recommended for adults age 12 or older.  However, adults younger than 75 years old with certain underlying conditions such as diabetes, heart or lung disease should also receive the vaccine.  Shingles vaccine to protect against Varicella Zoster if you are older than age 56, or younger than 75 years old with certain underlying illness.  Hepatitis A vaccine to protect against a form of infection of the liver by a virus acquired from food.  Hepatitis B vaccine to protect against a form of infection of the liver by a virus acquired from blood or body fluids, particularly if you work in health care.  If you plan to travel internationally, check with your local health department for specific vaccination recommendations.  Cancer Screening:  Most routine colon cancer screening begins at the age of 61. On a yearly basis, doctors may  provide special easy to use take-home tests to check for hidden blood in the stool. Sigmoidoscopy or colonoscopy can detect the earliest forms of colon cancer and is life saving. These tests use a small camera at the end of a tube to directly examine the colon. Speak to your caregiver about this at age 8, when routine screening begins (and is repeated every 5 years unless early forms of pre-cancerous polyps or small growths are found).   At the age of 67 men usually start screening for prostate cancer every year. Screening may begin at a younger age for those with higher risk. Those at higher risk include African-Americans or having a family history of prostate cancer. There are two types of tests for prostate cancer:   Prostate-specific antigen (PSA) testing. Recent studies raise questions about prostate cancer using PSA and you should discuss this with your caregiver.   Digital rectal exam (in which your doctor's lubricated and gloved finger feels for enlargement of the prostate through the anus).   Screening for testicular cancer.  Do a monthly exam of your testicles. Gently roll each testicle between your thumb and fingers, feeling for any abnormal lumps. The best time to do this is after a hot shower or bath when the tissues are looser. Notify your caregivers of any lumps, tenderness or changes in size or shape immediately.

## 2011-03-30 ENCOUNTER — Encounter: Payer: Self-pay | Admitting: Medical

## 2011-03-30 DIAGNOSIS — D473 Essential (hemorrhagic) thrombocythemia: Secondary | ICD-10-CM | POA: Insufficient documentation

## 2011-03-30 LAB — COMPREHENSIVE METABOLIC PANEL
AST: 18 U/L (ref 0–37)
Alkaline Phosphatase: 63 U/L (ref 39–117)
BUN: 19 mg/dL (ref 6–23)
Calcium: 9.4 mg/dL (ref 8.4–10.5)
Chloride: 107 mEq/L (ref 96–112)
Creat: 1.4 mg/dL — ABNORMAL HIGH (ref 0.50–1.35)
Glucose, Bld: 83 mg/dL (ref 70–99)

## 2011-03-30 LAB — LIPID PANEL
HDL: 30 mg/dL — ABNORMAL LOW (ref >39)
Total CHOL/HDL Ratio: 4.7 Ratio

## 2011-03-30 LAB — TSH: TSH: 2.929 u[IU]/mL (ref 0.350–4.500)

## 2011-05-15 ENCOUNTER — Other Ambulatory Visit: Payer: Self-pay | Admitting: Family Medicine

## 2011-05-26 ENCOUNTER — Other Ambulatory Visit: Payer: Self-pay | Admitting: Oncology

## 2011-05-26 ENCOUNTER — Other Ambulatory Visit (HOSPITAL_BASED_OUTPATIENT_CLINIC_OR_DEPARTMENT_OTHER): Payer: Medicare Other | Admitting: Lab

## 2011-05-26 DIAGNOSIS — D473 Essential (hemorrhagic) thrombocythemia: Secondary | ICD-10-CM

## 2011-05-26 LAB — CBC WITH DIFFERENTIAL/PLATELET
BASO%: 0 % (ref 0.0–2.0)
EOS%: 1.1 % (ref 0.0–7.0)
HCT: 31.9 % — ABNORMAL LOW (ref 38.4–49.9)
LYMPH%: 15.5 % (ref 14.0–49.0)
MCH: 32.2 pg (ref 27.2–33.4)
MCHC: 33.3 g/dL (ref 32.0–36.0)
NEUT%: 73.1 % (ref 39.0–75.0)
Platelets: 515 10*3/uL — ABNORMAL HIGH (ref 140–400)
lymph#: 1.9 10*3/uL (ref 0.9–3.3)

## 2011-07-13 ENCOUNTER — Other Ambulatory Visit: Payer: Self-pay | Admitting: Family Medicine

## 2011-07-27 ENCOUNTER — Other Ambulatory Visit: Payer: Self-pay | Admitting: *Deleted

## 2011-07-27 ENCOUNTER — Other Ambulatory Visit: Payer: Self-pay | Admitting: Oncology

## 2011-07-27 ENCOUNTER — Other Ambulatory Visit: Payer: Medicare Other | Admitting: Lab

## 2011-07-27 DIAGNOSIS — D473 Essential (hemorrhagic) thrombocythemia: Secondary | ICD-10-CM

## 2011-08-03 ENCOUNTER — Ambulatory Visit: Payer: Medicare Other | Admitting: Oncology

## 2011-08-13 ENCOUNTER — Telehealth: Payer: Self-pay | Admitting: Oncology

## 2011-08-13 NOTE — Telephone Encounter (Signed)
S/w the pt' wife and she is aware of the appt time change on 09/06/2011 from 12:noon to 9:45am

## 2011-08-23 ENCOUNTER — Ambulatory Visit (INDEPENDENT_AMBULATORY_CARE_PROVIDER_SITE_OTHER): Payer: Medicare Other | Admitting: Family Medicine

## 2011-08-23 ENCOUNTER — Ambulatory Visit: Payer: Medicare Other | Admitting: Family Medicine

## 2011-08-23 DIAGNOSIS — IMO0002 Reserved for concepts with insufficient information to code with codable children: Secondary | ICD-10-CM | POA: Diagnosis not present

## 2011-08-23 DIAGNOSIS — Z23 Encounter for immunization: Secondary | ICD-10-CM | POA: Diagnosis not present

## 2011-08-23 DIAGNOSIS — L02419 Cutaneous abscess of limb, unspecified: Secondary | ICD-10-CM | POA: Diagnosis not present

## 2011-08-23 DIAGNOSIS — L03115 Cellulitis of right lower limb: Secondary | ICD-10-CM

## 2011-08-23 DIAGNOSIS — L03119 Cellulitis of unspecified part of limb: Secondary | ICD-10-CM

## 2011-08-23 DIAGNOSIS — Z0000 Encounter for general adult medical examination without abnormal findings: Secondary | ICD-10-CM

## 2011-08-23 MED ORDER — DOXYCYCLINE HYCLATE 100 MG PO TABS: 100.0000 mg | ORAL_TABLET | Freq: Two times a day (BID) | ORAL | Status: AC

## 2011-08-23 NOTE — Progress Notes (Signed)
  Subjective:    Patient ID: Nicholas [name], male    DOB: 11-01-1927, 78 y.o.   MRN: [id number]  HPI He injured his right leg 4 days ago while at the [facility]. Apparently he tripped over a chair sustaining an abrasion to the right shin area. His been slowly getting more red and tender.   Review of Systems     Objective:   Physical Exam  Exam of the right shin shows a healing abrasion with surrounding erythema and swelling. It is warm and slightly tender to touch.      Assessment & Plan:   1. Cellulitis of right leg    2. Abrasion of right hip or leg    3. Preventative health care  Tdap vaccine greater than or equal to 7yo IM   I will place him on doxycycline. He is to keep his leg elevated as much as possible. 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 He is to return here on Friday for a recheck.

## 2011-08-23 NOTE — Patient Instructions (Signed)
Keep your leg elevated. You can get up and go to the bathroom or eat otherwise I want it up. You can take Tylenol or Advil for the pain

## 2011-08-26 ENCOUNTER — Encounter: Payer: Self-pay | Admitting: Medical

## 2011-08-26 ENCOUNTER — Ambulatory Visit (INDEPENDENT_AMBULATORY_CARE_PROVIDER_SITE_OTHER): Payer: Medicare Other | Admitting: Medical

## 2011-08-26 VITALS — BP 132/70 | HR 90 | Temp 98.0°F | Wt 187.0 lb

## 2011-08-26 DIAGNOSIS — L02419 Cutaneous abscess of limb, unspecified: Secondary | ICD-10-CM

## 2011-08-26 DIAGNOSIS — L03119 Cellulitis of unspecified part of limb: Secondary | ICD-10-CM | POA: Diagnosis not present

## 2011-08-26 DIAGNOSIS — S81009A Unspecified open wound, unspecified knee, initial encounter: Secondary | ICD-10-CM

## 2011-08-26 DIAGNOSIS — S81801A Unspecified open wound, right lower leg, initial encounter: Secondary | ICD-10-CM

## 2011-08-26 DIAGNOSIS — S91009A Unspecified open wound, unspecified ankle, initial encounter: Secondary | ICD-10-CM | POA: Diagnosis not present

## 2011-08-26 NOTE — Patient Instructions (Signed)
1) wet to dry dressings - put on a gauze over the wound that is wet with saline.   Then wrap dressing around the gauze to hold in on the leg.   2) leave this on wet until it dries.  Then you can put on another wet dressing.  Continue this process of wet to dry dressing 3-4 times daily.  3) you can use a warm compresses on the leg 3 times daily as well.  4) at night time, continue Neosporin and dressing/bandage as you have been doing.  5) continue the doxycycline antibiotic  6) continue elevating the leg throughout the day.   Get up occasionally to move the circulation in the leg.    7) clean the area with soap and water when you bathe.  8) lets see you back on Monday.  If worse swelling, redness, pain or fever, call or return right away.  My cell number is [phone].

## 2011-08-26 NOTE — Progress Notes (Signed)
  Subjective:    Patient ID: Nicholas [name], male    DOB: 04-12-1928, 78 y.o.   MRN: [id number]  HPI Here today for recheck on right leg injury.  Saw Dr. Redmond School for this earlier in the week, put on Doxycycline antibiotic, complete rest with leg elevation.   He is compliant with treatment plan, using some neosporin topically as well.  No other aggravating or relieving factors.    Review of Systems Gen: no fever, chills Neuro: no numbness or tingling    Objective:   Physical Exam  Gen: wd, wn, nad Exam of the right shin shows a healing abrasion with surrounding erythema and swelling. It is warm and slightly tender to touch.  No significant change worse or better than 2 days ago. Neuro: normal sensation Pulses: 1+ pedal pulses      Assessment & Plan:   Encounter Diagnoses  Name Primary?  . Cellulitis of leg Yes  . Wound of right leg    Advised continued rest, elevation, Doxycycline, begin wet to dry dressings during the day, neosporin and bandage at night.  Discussed signs of worsening infection that would prompt recheck.  Otherwise, return for recheck Monday.

## 2011-08-30 ENCOUNTER — Other Ambulatory Visit (HOSPITAL_BASED_OUTPATIENT_CLINIC_OR_DEPARTMENT_OTHER): Payer: Medicare Other

## 2011-08-30 ENCOUNTER — Encounter: Payer: Self-pay | Admitting: Medical

## 2011-08-30 ENCOUNTER — Ambulatory Visit (INDEPENDENT_AMBULATORY_CARE_PROVIDER_SITE_OTHER): Payer: Medicare Other | Admitting: Medical

## 2011-08-30 VITALS — BP 148/70 | HR 96 | Temp 98.3°F | Resp 16 | Wt 188.0 lb

## 2011-08-30 DIAGNOSIS — D473 Essential (hemorrhagic) thrombocythemia: Secondary | ICD-10-CM | POA: Diagnosis not present

## 2011-08-30 DIAGNOSIS — L03119 Cellulitis of unspecified part of limb: Secondary | ICD-10-CM

## 2011-08-30 DIAGNOSIS — S81801A Unspecified open wound, right lower leg, initial encounter: Secondary | ICD-10-CM

## 2011-08-30 DIAGNOSIS — S81009A Unspecified open wound, unspecified knee, initial encounter: Secondary | ICD-10-CM | POA: Diagnosis not present

## 2011-08-30 DIAGNOSIS — S91009A Unspecified open wound, unspecified ankle, initial encounter: Secondary | ICD-10-CM | POA: Diagnosis not present

## 2011-08-30 DIAGNOSIS — L02419 Cutaneous abscess of limb, unspecified: Secondary | ICD-10-CM

## 2011-08-30 LAB — CBC WITH DIFFERENTIAL/PLATELET
BASO%: 0.8 % (ref 0.0–2.0)
HCT: 31.8 % — ABNORMAL LOW (ref 38.4–49.9)
MCHC: 33.5 g/dL (ref 32.0–36.0)
MONO#: 1 10*3/uL — ABNORMAL HIGH (ref 0.1–0.9)
NEUT%: 74.1 % (ref 39.0–75.0)
WBC: 14.2 10*3/uL — ABNORMAL HIGH (ref 4.0–10.3)
lymph#: 2.4 10*3/uL (ref 0.9–3.3)

## 2011-08-30 NOTE — Progress Notes (Signed)
  Subjective:    Patient ID: Nicholas [name], male    DOB: 1928/03/03, 77 y.o.   MRN: [id number]  HPI Here today for recheck on right leg injury.  Over the weekend he continued to elevated the leg, take the antibiotic, and using the wet to dry dressings.   Feels like the leg is improving.  No other aggravating or relieving factors.    Review of Systems Gen: no fever, chills Neuro: no numbness or tingling    Objective:   Physical Exam  Gen: wd, wn, NAD Exam of the right shin shows a healing abrasion with surrounding erythema and swelling. There is no warmth today, nontender.  The swelling is much improved today.   Neuro: normal sensation Pulses: 1+ pedal pulses      Assessment & Plan:   Encounter Diagnoses  Name Primary?  . Cellulitis of leg Yes  . Wound of right leg    Advised continued rest and elevation another 2 days, out of work 2 days.  Continue Doxycycline, neosporin and bandage 3 times daily.  Discussed signs of worsening infection that would prompt recheck.  Otherwise, as long as he continues to improve, can go back to work Wednesday.  He is improving. Discussed plan with patient and daughter.

## 2011-08-30 NOTE — Patient Instructions (Signed)
1) continue elevating the leg most of the day for the next 2 days.   Out of work Monday and Tuesday.  You can go back Wednesday provided the leg isn't worse, but I would still have him elevated the leg when he is home until it is completley resolved.  2) finish the Doxycycline antibiotic twice daily  3) continue using the Neosporin, nonstick bandage and keeping it clean and covered.  It will take another 5-7 days for the scab to completley heal, but its moving in the right direction.  4) have [name] look at the wound Thursday to let me know how it is doing.   Call or return if any concerns or questions.

## 2011-09-06 ENCOUNTER — Telehealth: Payer: Self-pay | Admitting: Oncology

## 2011-09-06 ENCOUNTER — Encounter: Payer: Self-pay | Admitting: Physician Assistant

## 2011-09-06 ENCOUNTER — Ambulatory Visit (HOSPITAL_BASED_OUTPATIENT_CLINIC_OR_DEPARTMENT_OTHER): Payer: Medicare Other | Admitting: Physician Assistant

## 2011-09-06 VITALS — BP 149/56 | HR 94 | Temp 98.3°F | Ht 74.0 in | Wt 187.1 lb

## 2011-09-06 DIAGNOSIS — D473 Essential (hemorrhagic) thrombocythemia: Secondary | ICD-10-CM | POA: Diagnosis not present

## 2011-09-06 NOTE — Progress Notes (Signed)
ID: Nicholas [name]   DOB: Jan 09, 1928  MR#: [id number]  [id number]  HISTORY OF PRESENT ILLNESS: Patient was initially diagnosed with essential thrombocytosis in December of 2003. He has been followed regularly with labs since that time, to disease process controlled with anagrelide 0.5 mg Nicholas aspirin 81 mg daily.   INTERVAL HISTORY: Nicholas [name] returns today for followup of his essential thrombocytosis. Interval history is remarkable only for recent leg injury leading to cellulitis which was treated with doxycycline. He tells me the wound "bled pretty good" but he has had no additional [name], Nicholas had no evidence of blood clots. The wound is now healing well, almost resolved. He continues to drive an activity bus for the [employer] Nicholas prefers that his appointments be in the summer.  REVIEW OF SYSTEMS: Otherwise, Nicholas [name] is feeling well. He has had no recent illnesses Nicholas denies fevers, chills, or night sweats. He tells me his energy level is good "for a 78 year old" . He's had no skin changes Nicholas no signs of abnormal [name]. No increased shortness of breath Nicholas no chest pain. He has some occasional knee pains associated with arthritis. He feels a little forgetful at times.  A detailed review of systems is otherwise noncontributory.   PAST MEDICAL HISTORY: Past Medical History  Diagnosis Date  . Anxiety   . Dysthymia   . Hypertension   . Allergy   . Chronic sinusitis   . Essential thrombocytosis     sees hematology    PAST SURGICAL HISTORY: Past Surgical History  Procedure Date  . Hernia repair     right inguinal repair  . Varicose vein surgery     right lower leg  . Tonsillectomy     childhood    FAMILY HISTORY Family History  Problem Relation Age of Onset  . Heart disease Father   . Stroke Father   . Cancer Neg Hx   . COPD Neg Hx   . Diabetes Neg Hx     SOCIAL HISTORY:    ADVANCED DIRECTIVES:  HEALTH MAINTENANCE: History  Substance Use  Topics  . Smoking status: Former Smoker    Quit date: 06/14/1958  . Smokeless tobacco: Never Used  . Alcohol Use: No     Colonoscopy:  PAP:  Bone density:  Lipid panel:  Allergies  Allergen Reactions  . Erythromycin     Current Outpatient Prescriptions  Medication Sig Dispense Refill  . anagrelide (AGRYLIN) 0.5 MG capsule Take 0.5 mg by mouth daily.        [name] aspirin 81 MG tablet Take 81 mg by mouth daily.        [name] FLUoxetine (PROZAC) 40 MG capsule Take 1 capsule (40 mg total) by mouth daily. Take 1/2 tablet QHS x 1wk, then 1 tablet QHS  30 capsule  11  . terazosin (HYTRIN) 2 MG capsule TAKE ONE CAPSULE BY MOUTH EVERY DAY  30 capsule  1    OBJECTIVE: Filed Vitals:   09/06/11 0950  BP: 149/56  Pulse: 94  Temp: 98.3 F (36.8 C)     Body mass index is 24.02 kg/(m^2).    ECOG FS: 0 Physical Exam: HEENT:  Sclerae anicteric, conjunctivae pink.  Oropharynx clear.  No mucositis or candidiasis.   Nodes:  No cervical, supraclavicular, or axillary lymphadenopathy palpated.  Lungs:  Clear to auscultation bilaterally.  No crackles, rhonchi, or wheezes.   Heart:  Regular rate Nicholas rhythm.   Abdomen:  Soft, nontender.  Positive bowel sounds.  No organomegaly or masses palpated.   Musculoskeletal:  No focal spinal tenderness to palpation.  Extremities:  Benign.  No peripheral edema or cyanosis.   Skin:  Benign.   Neuro:  Nonfocal.    LAB RESULTS: Lab Results  Component Value Date   WBC 14.2* 08/30/2011   NEUTROABS 10.5* 08/30/2011   HGB 10.7* 08/30/2011   HCT 31.8* 08/30/2011   MCV 97.4 08/30/2011   PLT 574* 08/30/2011      Chemistry      Component Value Date/Time   NA 141 03/29/2011 0907   K 4.6 03/29/2011 0907   CL 107 03/29/2011 0907   CO2 24 03/29/2011 0907   BUN 19 03/29/2011 0907   CREATININE 1.40* 03/29/2011 0907      Component Value Date/Time   CALCIUM 9.4 03/29/2011 0907   ALKPHOS 63 03/29/2011 0907   AST 18 03/29/2011 0907   ALT 14 03/29/2011 0907   BILITOT  0.5 03/29/2011 0907       STUDIES: No results found.  ASSESSMENT: 78 year old [place] man with a history of essential thrombocytosis initially diagnosed December 2003, controlled on anagrelide 0.5 mg daily Nicholas aspirin 81 mg daily.   PLAN: Clinically, Nicholas [name] continues to do well, Nicholas will continue on his current dosage of anagrelide Nicholas baby aspirin. We will continue to check his counts on a every 3 month basis. He'll return to see Dr. Jana Hakim in one year (he has requested a 78 June appointment).  Of course if there is ever a significant change in his circulating white cells or drop in hemoglobin, we will likely proceed to bone marrow biopsy to rule out progression of his myeloproliferative syndrome. Nicholas [name] voices understanding Nicholas agreement with this plan Nicholas will call any changes or problems.   Daniqua Campoy    09/06/2011

## 2011-09-06 NOTE — Telephone Encounter (Signed)
gve the pt his June-feb 2014 appt calendar . Could not schedule any more appts further than feb 2014 due to the templates were not built in the system yet. The pt is aware to stop by the scheduling desk in feb 2014 to pick up the rest of his 2014 appts for labs and the md appt.

## 2011-09-07 ENCOUNTER — Telehealth: Payer: Self-pay | Admitting: Internal Medicine

## 2011-09-07 MED ORDER — TERAZOSIN HCL 2 MG PO CAPS: 2.0000 mg | ORAL_CAPSULE | Freq: Every day | ORAL | Status: DC

## 2011-09-07 NOTE — Telephone Encounter (Signed)
90 days sent in per pt request

## 2011-10-21 ENCOUNTER — Telehealth: Payer: Self-pay | Admitting: Medical

## 2011-10-22 ENCOUNTER — Other Ambulatory Visit: Payer: Self-pay | Admitting: Medical

## 2011-10-22 DIAGNOSIS — F341 Dysthymic disorder: Secondary | ICD-10-CM

## 2011-10-22 MED ORDER — FLUOXETINE HCL 40 MG PO CAPS: 40.0000 mg | ORAL_CAPSULE | Freq: Every day | ORAL | Status: DC

## 2011-10-22 NOTE — Telephone Encounter (Signed)
rx sent

## 2011-11-29 ENCOUNTER — Other Ambulatory Visit: Payer: Medicare Other | Admitting: Lab

## 2011-12-06 ENCOUNTER — Other Ambulatory Visit: Payer: Self-pay | Admitting: Oncology

## 2011-12-06 ENCOUNTER — Other Ambulatory Visit: Payer: Medicare Other | Admitting: Lab

## 2011-12-06 DIAGNOSIS — D473 Essential (hemorrhagic) thrombocythemia: Secondary | ICD-10-CM

## 2011-12-06 LAB — CBC WITH DIFFERENTIAL/PLATELET
Basophils Absolute: 0.1 10*3/uL (ref 0.0–0.1)
EOS%: 0.8 % (ref 0.0–7.0)
Eosinophils Absolute: 0.1 10*3/uL (ref 0.0–0.5)
HGB: 10.4 g/dL — ABNORMAL LOW (ref 13.0–17.1)
LYMPH%: 15.1 % (ref 14.0–49.0)
MCH: 31.1 pg (ref 27.2–33.4)
MCV: 95.8 fL (ref 79.3–98.0)
MONO%: 11.1 % (ref 0.0–14.0)
NEUT#: 10 10*3/uL — ABNORMAL HIGH (ref 1.5–6.5)
NEUT%: 72 % (ref 39.0–75.0)
Platelets: 442 10*3/uL — ABNORMAL HIGH (ref 140–400)
RDW: 19.8 % — ABNORMAL HIGH (ref 11.0–14.6)

## 2011-12-21 ENCOUNTER — Other Ambulatory Visit: Payer: Self-pay | Admitting: *Deleted

## 2011-12-21 MED ORDER — ANAGRELIDE HCL 0.5 MG PO CAPS: 0.5000 mg | ORAL_CAPSULE | Freq: Every day | ORAL | Status: DC

## 2012-02-17 ENCOUNTER — Other Ambulatory Visit: Payer: Self-pay | Admitting: Medical

## 2012-02-17 ENCOUNTER — Telehealth: Payer: Self-pay | Admitting: Medical

## 2012-02-17 DIAGNOSIS — F341 Dysthymic disorder: Secondary | ICD-10-CM

## 2012-02-17 MED ORDER — FLUOXETINE HCL 40 MG PO CAPS: 40.0000 mg | ORAL_CAPSULE | Freq: Every day | ORAL | Status: DC

## 2012-02-17 NOTE — Telephone Encounter (Signed)
RX refill for Prozac

## 2012-02-21 ENCOUNTER — Other Ambulatory Visit (HOSPITAL_BASED_OUTPATIENT_CLINIC_OR_DEPARTMENT_OTHER): Payer: Medicare Other

## 2012-02-21 DIAGNOSIS — D473 Essential (hemorrhagic) thrombocythemia: Secondary | ICD-10-CM | POA: Diagnosis not present

## 2012-02-21 LAB — CBC WITH DIFFERENTIAL/PLATELET
EOS%: 0 % (ref 0.0–7.0)
Eosinophils Absolute: 0 10*3/uL (ref 0.0–0.5)
LYMPH%: 19.2 % (ref 14.0–49.0)
MCH: 31.6 pg (ref 27.2–33.4)
MCHC: 32.4 g/dL (ref 32.0–36.0)
MCV: 97.5 fL (ref 79.3–98.0)
MONO%: 12.7 % (ref 0.0–14.0)
NEUT#: 7.9 10*3/uL — ABNORMAL HIGH (ref 1.5–6.5)
Platelets: 408 10*3/uL — ABNORMAL HIGH (ref 140–400)
RBC: 3.23 10*6/uL — ABNORMAL LOW (ref 4.20–5.82)
RDW: 19 % — ABNORMAL HIGH (ref 11.0–14.6)
nRBC: 1 % — ABNORMAL HIGH (ref 0–0)

## 2012-02-21 LAB — TECHNOLOGIST REVIEW

## 2012-02-22 NOTE — Telephone Encounter (Signed)
02/22/2012 °

## 2012-03-14 ENCOUNTER — Other Ambulatory Visit: Payer: Medicare Other

## 2012-03-14 DIAGNOSIS — Z23 Encounter for immunization: Secondary | ICD-10-CM

## 2012-03-14 MED ORDER — INFLUENZA VIRUS VACC SPLIT PF IM SUSP: 0.5000 mL | Freq: Once | INTRAMUSCULAR | Status: DC

## 2012-03-17 ENCOUNTER — Telehealth: Payer: Self-pay | Admitting: Medical

## 2012-03-17 ENCOUNTER — Other Ambulatory Visit: Payer: Self-pay | Admitting: Medical

## 2012-03-17 MED ORDER — TERAZOSIN HCL 2 MG PO CAPS: 2.0000 mg | ORAL_CAPSULE | Freq: Every day | ORAL | Status: DC

## 2012-03-17 NOTE — Telephone Encounter (Signed)
PT WAS CALLED AND MESSAGE WAS LEFT THAT MED CHECK APPT IS NEEDED.

## 2012-03-17 NOTE — Telephone Encounter (Signed)
Consider med check/routine f/u appt

## 2012-03-24 ENCOUNTER — Encounter: Payer: Self-pay | Admitting: Medical

## 2012-03-24 ENCOUNTER — Ambulatory Visit (INDEPENDENT_AMBULATORY_CARE_PROVIDER_SITE_OTHER): Payer: Medicare Other | Admitting: Medical

## 2012-03-24 VITALS — BP 138/60 | HR 80 | Temp 98.2°F | Resp 16 | Wt 194.0 lb

## 2012-03-24 DIAGNOSIS — I10 Essential (primary) hypertension: Secondary | ICD-10-CM | POA: Diagnosis not present

## 2012-03-24 DIAGNOSIS — J309 Allergic rhinitis, unspecified: Secondary | ICD-10-CM

## 2012-03-24 DIAGNOSIS — F419 Anxiety disorder, unspecified: Secondary | ICD-10-CM

## 2012-03-24 DIAGNOSIS — D473 Essential (hemorrhagic) thrombocythemia: Secondary | ICD-10-CM

## 2012-03-24 DIAGNOSIS — F411 Generalized anxiety disorder: Secondary | ICD-10-CM | POA: Diagnosis not present

## 2012-03-24 DIAGNOSIS — Z23 Encounter for immunization: Secondary | ICD-10-CM | POA: Diagnosis not present

## 2012-03-24 NOTE — Patient Instructions (Signed)
Continue your routine medications for blood cells, depression and blood pressure.    I would rather you switch to Zyrtec 10mg , 1 tablet nightly to help with congestion and allergies.  Lets try this instead of mucinex every day.   Reserve the [employer] for worse congestion as needed.  We will check labs today.  We gave you the pneumonia vaccine today.

## 2012-03-24 NOTE — Progress Notes (Signed)
  Subjective:   HPI  Nicholas [name]. is a 78 y.o. male who presents for routine f/u.  Compliant with medications for blood pressure and anxiety.  Doing well.  No real c/o.  He does use Mucinex daily for head congestion.  Mood has been good, no new problems.  Skin has been good and he finally got over the leg cellulitis he had a few months ago.  He just recently got his flu shot.  No other c/o.  The following portions of the patient's history were reviewed and updated as appropriate: allergies, current medications, past family history, past medical history, past social history, past surgical history and problem list.  Past Medical History  Diagnosis Date  . Anxiety   . Dysthymia   . Hypertension   . Allergy   . Chronic sinusitis   . Essential thrombocytosis     sees hematology    Allergies  Allergen Reactions  . Erythromycin      Review of Systems ROS reviewed and was negative other than noted in HPI or above.    Objective:   Physical Exam  General appearance: alert, no distress, WD/WN HEENT: normocephalic, sclerae anicteric, TMs pearly, nares patent, no discharge or erythema, pharynx normal Oral cavity: MMM, no lesions Neck: supple, no lymphadenopathy, no thyromegaly, no masses Heart: RRR, normal S1, S2, no murmurs Lungs: CTA bilaterally, no wheezes, rhonchi, or rales Pulses: 2+ symmetric, upper and lower extremities, normal cap refill   Assessment and Plan :     Encounter Diagnoses  Name Primary?  . Essential hypertension, benign Yes  . Need for pneumococcal vaccination   . Allergic rhinitis   . Anxiety   . Essential thrombocytosis    HTN - c/t same medication, labs today  Pneumococcal vaccine, VIS and counseling given  Allergic rhinitis- advise he begin Zyrtec daily, use Mucinex prn, not daily  Anxiety - doing well on Prozac, c/t same medication  Essential thrombocytosis - followed by Hematology.  F/u pending labs

## 2012-03-25 LAB — COMPREHENSIVE METABOLIC PANEL
AST: 17 U/L (ref 0–37)
Albumin: 4.2 g/dL (ref 3.5–5.2)
Alkaline Phosphatase: 57 U/L (ref 39–117)
Chloride: 107 mEq/L (ref 96–112)
Glucose, Bld: 109 mg/dL — ABNORMAL HIGH (ref 70–99)
Potassium: 4.5 mEq/L (ref 3.5–5.3)
Sodium: 143 mEq/L (ref 135–145)
Total Protein: 6.7 g/dL (ref 6.0–8.3)

## 2012-03-25 LAB — LIPID PANEL: LDL Cholesterol: 78 mg/dL (ref 0–99)

## 2012-03-27 NOTE — Progress Notes (Signed)
Pt was informed of lab results. I was informed that pt was NOT fasting for these labs. Pt will recheck in 3 months and will be fasting at that time.

## 2012-05-15 ENCOUNTER — Other Ambulatory Visit (HOSPITAL_BASED_OUTPATIENT_CLINIC_OR_DEPARTMENT_OTHER): Payer: Medicare Other | Admitting: Lab

## 2012-05-15 DIAGNOSIS — D473 Essential (hemorrhagic) thrombocythemia: Secondary | ICD-10-CM

## 2012-05-15 LAB — CBC WITH DIFFERENTIAL/PLATELET
Basophils Absolute: 0.3 10*3/uL — ABNORMAL HIGH (ref 0.0–0.1)
Eosinophils Absolute: 0.3 10*3/uL (ref 0.0–0.5)
HCT: 31.4 % — ABNORMAL LOW (ref 38.4–49.9)
HGB: 9.9 g/dL — ABNORMAL LOW (ref 13.0–17.1)
MONO#: 2.1 10*3/uL — ABNORMAL HIGH (ref 0.1–0.9)
NEUT%: 60.1 % (ref 39.0–75.0)
WBC: 13.3 10*3/uL — ABNORMAL HIGH (ref 4.0–10.3)
lymph#: 2.6 10*3/uL (ref 0.9–3.3)

## 2012-05-19 ENCOUNTER — Telehealth: Payer: Self-pay | Admitting: Family Medicine

## 2012-05-19 ENCOUNTER — Other Ambulatory Visit: Payer: Self-pay | Admitting: Medical

## 2012-05-19 MED ORDER — FLUOXETINE HCL 40 MG PO CAPS: 40.0000 mg | ORAL_CAPSULE | Freq: Every day | ORAL | Status: DC

## 2012-05-25 DIAGNOSIS — Z85820 Personal history of malignant melanoma of skin: Secondary | ICD-10-CM | POA: Diagnosis not present

## 2012-05-25 DIAGNOSIS — Z85828 Personal history of other malignant neoplasm of skin: Secondary | ICD-10-CM | POA: Diagnosis not present

## 2012-05-25 DIAGNOSIS — D485 Neoplasm of uncertain behavior of skin: Secondary | ICD-10-CM | POA: Diagnosis not present

## 2012-05-25 DIAGNOSIS — L821 Other seborrheic keratosis: Secondary | ICD-10-CM | POA: Diagnosis not present

## 2012-05-25 NOTE — Telephone Encounter (Signed)
Done

## 2012-06-19 ENCOUNTER — Telehealth: Payer: Self-pay | Admitting: Medical

## 2012-06-20 ENCOUNTER — Other Ambulatory Visit: Payer: Self-pay | Admitting: Medical

## 2012-06-20 MED ORDER — FLUOXETINE HCL 40 MG PO CAPS: 40.0000 mg | ORAL_CAPSULE | Freq: Every day | ORAL | Status: DC

## 2012-06-21 NOTE — Telephone Encounter (Signed)
Done

## 2012-08-02 ENCOUNTER — Ambulatory Visit (INDEPENDENT_AMBULATORY_CARE_PROVIDER_SITE_OTHER): Payer: Medicare Other | Admitting: Medical

## 2012-08-02 ENCOUNTER — Ambulatory Visit
Admission: RE | Admit: 2012-08-02 | Discharge: 2012-08-02 | Disposition: A | Discharge status: Home / Self Care | Payer: Medicare Other | Source: Ambulatory Visit | Attending: Medical | Admitting: Medical

## 2012-08-02 ENCOUNTER — Encounter: Payer: Self-pay | Admitting: Medical

## 2012-08-02 VITALS — BP 128/74 | HR 80 | Temp 98.3°F | Resp 18 | Wt 189.0 lb

## 2012-08-02 DIAGNOSIS — L609 Nail disorder, unspecified: Secondary | ICD-10-CM | POA: Diagnosis not present

## 2012-08-02 DIAGNOSIS — M79672 Pain in left foot: Secondary | ICD-10-CM

## 2012-08-02 DIAGNOSIS — M79609 Pain in unspecified limb: Secondary | ICD-10-CM

## 2012-08-02 DIAGNOSIS — B353 Tinea pedis: Secondary | ICD-10-CM

## 2012-08-02 DIAGNOSIS — L0291 Cutaneous abscess, unspecified: Secondary | ICD-10-CM | POA: Diagnosis not present

## 2012-08-02 DIAGNOSIS — L0390 Cellulitis, unspecified: Secondary | ICD-10-CM

## 2012-08-02 DIAGNOSIS — M19079 Primary osteoarthritis, unspecified ankle and foot: Secondary | ICD-10-CM | POA: Diagnosis not present

## 2012-08-02 DIAGNOSIS — L608 Other nail disorders: Secondary | ICD-10-CM

## 2012-08-02 MED ORDER — DOXYCYCLINE HYCLATE 100 MG PO TABS: 100.0000 mg | ORAL_TABLET | Freq: Two times a day (BID) | ORAL | Status: DC

## 2012-08-02 MED ORDER — TERBINAFINE HCL 1 % EX CREA: TOPICAL_CREAM | Freq: Two times a day (BID) | CUTANEOUS | Status: DC

## 2012-08-02 NOTE — Patient Instructions (Signed)
Begin Doxycycline antibiotic, twice daily for 10 days.    Begin Lamisil OTC cream on feet and in between toes daily at bedtime for 2-3 weeks  Elevate the leg.    Use Aleve once to twice daily for a few days for pain and inflammation  Go for xray of foot  Call or return if worse or not improving.  Otherwise recheck Monday.

## 2012-08-02 NOTE — Progress Notes (Signed)
Subjective Here for foot pain, left foot around ankle and heel and side of foot last several days.  Feels very sore to touch.  Denies fall, trauma, injury, wound.  He does have hx/o "poor circulation" , and uses cream prescribed by dermatology daily on the legs.   Has hx/o cellulitis right leg last years, took weeks to resolve.  Has hx/o venous stasis disease.  Currently using foot powder on the leg.  Leg feels a little warm.  Wonders if this is gout.  No hx/o gout.   Past Medical History  Diagnosis Date  . Anxiety   . Dysthymia   . Hypertension   . Allergy   . Chronic sinusitis   . Essential thrombocytosis     sees hematology   ROS Gen; no fever, chills, sweats, weight loss GI: no NVD Neuro no numbness, tingling, weakness  Objective: Gen: wd, wn, nad, somewhat of foul odor to leg in general, mild Ext: no edema, clubbing or cyanosis, moderate LE varicosities present bilat 1+ pedal pulses, normal cap refill MSK: tenderness over left lateral foot around calcaneous, inferior to ankle, along 5th metacarpal base, and along lateral malleolus, mild pain with ankle ROM, ROM slightly reduced Skin: flaky, somewhat odorous skin of bilat feet and between toes concerning for tinea pedis, mild pink/red coloration left lateral foot, warmth along foot and ankle left compared to normal temp right foot.  Flaky dry skin of both lower legs.  Thickened toenails throughout.   Assessment: Encounter Diagnoses  Name Primary?  [name] Foot pain, left Yes  . Cellulitis   . Tinea pedis   . Toenail deformity     Plan:  This may represent early cellulitis from break in skin or tinea, less likely gout.  No hx/o gout either.   Go for xray today.   Begin Lamisil OTC, discussed proper use, begin doxycycline, elevate leg, use lubriderm on leg in general for moisturizing daily, recheck Monday in 4 days, sooner prn, particularly if swelling, worse warmth /redness, fever.  Consider podiatry for toenail care/foot care once  this is resolving.

## 2012-08-07 ENCOUNTER — Ambulatory Visit (INDEPENDENT_AMBULATORY_CARE_PROVIDER_SITE_OTHER): Payer: Medicare Other | Admitting: Medical

## 2012-08-07 ENCOUNTER — Encounter: Payer: Self-pay | Admitting: Medical

## 2012-08-07 ENCOUNTER — Ambulatory Visit: Payer: Medicare Other | Admitting: Medical

## 2012-08-07 ENCOUNTER — Other Ambulatory Visit: Payer: Medicare Other | Admitting: Lab

## 2012-08-07 VITALS — BP 112/70 | HR 80 | Temp 98.1°F | Resp 14 | Wt 192.0 lb

## 2012-08-07 DIAGNOSIS — L0889 Other specified local infections of the skin and subcutaneous tissue: Secondary | ICD-10-CM | POA: Diagnosis not present

## 2012-08-07 DIAGNOSIS — L080 Pyoderma: Secondary | ICD-10-CM

## 2012-08-07 DIAGNOSIS — L0291 Cutaneous abscess, unspecified: Secondary | ICD-10-CM | POA: Diagnosis not present

## 2012-08-07 DIAGNOSIS — L0390 Cellulitis, unspecified: Secondary | ICD-10-CM

## 2012-08-07 MED ORDER — MUPIROCIN 2 % EX OINT: TOPICAL_OINTMENT | Freq: Three times a day (TID) | CUTANEOUS | Status: DC

## 2012-08-07 NOTE — Patient Instructions (Signed)
You have a skin infection of the left lateral foot.  This is ecthyma vs cellulitis.   Finish oral antibiotic Doxycycline twice daily.  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  Scrub the wound gently twice daily  Then apply the Mupirocin ointment to the ulcer 3 times daily once you have blotted it dry

## 2012-08-07 NOTE — Progress Notes (Signed)
Subjective Here for recheck on left foot pain.  I saw him 08/02/12 for left foot pain, left foot around ankle and heel and side of foot.  Redness and swelling better, but still painful.  At last visit he was noting soreness to touch.  Denies fall, trauma, injury, wound.  He does have hx/o "poor circulation" , and uses cream prescribed by dermatology daily on the legs.   Has hx/o cellulitis right leg last years, took weeks to resolve.  Has hx/o venous stasis disease.    Past Medical History  Diagnosis Date  . Anxiety   . Dysthymia   . Hypertension   . Allergy   . Chronic sinusitis   . Essential thrombocytosis     sees hematology   ROS Gen; no fever, chills, sweats, weight loss GI: no NVD Neuro no numbness, tingling, weakness  Objective: Gen: wd, wn, nad, somewhat of foul odor to leg in general, mild Ext: no edema, clubbing or cyanosis, moderate LE varicosities present bilat 1+ pedal pulses, normal cap refill MSK: tenderness over left lateral foot around calcaneous, inferior to ankle, along 5th metacarpal base, and along lateral malleolus, mild pain with ankle ROM, ROM slightly reduced Skin: flaky, somewhat odorous skin of bilat feet and between toes concerning for tinea pedis, less erythema of left lateral foot today, but there is a 1 cm diameter slightly depressed ulceration now at the most tender area inferior to malleolous suggestive of ecthyma, flaky dry skin of both lower legs.  Thickened toenails throughout.   Assessment: Encounter Diagnoses  Name Primary?  . Ecthyma Yes  . Cellulitis     Plan:  C/t doxycycline, begin TID soaps and soap and water scrubs, begin TID mupirocin ointment to wound, c/t Lamisil OTC, c/t leg elevation, use lubriderm on leg in general for moisturizing daily, recheck 1wk.

## 2012-08-16 ENCOUNTER — Ambulatory Visit (INDEPENDENT_AMBULATORY_CARE_PROVIDER_SITE_OTHER): Payer: Medicare Other | Admitting: Medical

## 2012-08-16 ENCOUNTER — Encounter: Payer: Self-pay | Admitting: Medical

## 2012-08-16 VITALS — BP 110/60 | HR 100 | Temp 98.1°F | Resp 16 | Wt 194.0 lb

## 2012-08-16 DIAGNOSIS — Z5189 Encounter for other specified aftercare: Secondary | ICD-10-CM

## 2012-08-16 DIAGNOSIS — L0889 Other specified local infections of the skin and subcutaneous tissue: Secondary | ICD-10-CM

## 2012-08-16 DIAGNOSIS — L0390 Cellulitis, unspecified: Secondary | ICD-10-CM

## 2012-08-16 DIAGNOSIS — L080 Pyoderma: Secondary | ICD-10-CM

## 2012-08-16 DIAGNOSIS — L0291 Cutaneous abscess, unspecified: Secondary | ICD-10-CM

## 2012-08-16 NOTE — Patient Instructions (Signed)
Soak with salt water for 20 minutes 3 time daily.  Elevated the leg for 20 minutes 2-3 times daily.  Continue cleaning the wound with wash cloth to remove superficial debris  You can continue the ointment and gauze when you are wearing shoes. Consider a donut pad OTC for padding/protection.  Swing by and let me see it again in 1 week.

## 2012-08-16 NOTE — Progress Notes (Signed)
Subjective Here for recheck on left foot pain.  He has finished Doxycyline, using elevation, soaks, topical antibiotic.  Redness and swelling better,slightly painful now.    Past Medical History  Diagnosis Date  . Anxiety   . Dysthymia   . Hypertension   . Allergy   . Chronic sinusitis   . Essential thrombocytosis     sees hematology   ROS Gen; no fever, chills, sweats, weight loss GI: no NVD Neuro no numbness, tingling, weakness  Objective: Gen: wd, wn, nad Ext: no edema, clubbing or cyanosis, moderate LE varicosities present bilat 1+ pedal pulses, normal cap refill MSK: slight tenderness over left lateral foot around calcaneous, inferior to ankle, along 5th metacarpal base, and along lateral malleolus, mild pain with ankle ROM, ROM slightly reduced Skin:no erythema of left lateral foot today, 1 cm diameter slightly depressed ulceration, flaky dry skin of both lower legs.  Thickened toenails throughout.   Assessment: Encounter Diagnoses  Name Primary?  . Ecthyma Yes  . Cellulitis   . Visit for wound check     Plan:  Much improved, still some ulceration and eschar present.   Patient Instructions  Soak with salt water for 20 minutes 3 time daily.  Elevated the leg for 20 minutes 2-3 times daily.  Continue cleaning the wound with wash cloth to remove superficial debris  You can continue the ointment and gauze when you are wearing shoes. Consider a donut pad OTC for padding/protection.  Swing by and let me see it again in 1 week.

## 2012-08-22 ENCOUNTER — Encounter: Payer: Self-pay | Admitting: Medical

## 2012-08-22 ENCOUNTER — Telehealth: Payer: Self-pay | Admitting: Internal Medicine

## 2012-08-22 ENCOUNTER — Ambulatory Visit (INDEPENDENT_AMBULATORY_CARE_PROVIDER_SITE_OTHER): Payer: Medicare Other | Admitting: Medical

## 2012-08-22 ENCOUNTER — Ambulatory Visit: Payer: Medicare Other | Admitting: Medical

## 2012-08-22 VITALS — BP 126/78 | HR 92 | Temp 98.1°F | Resp 16 | Wt 186.0 lb

## 2012-08-22 DIAGNOSIS — Z5189 Encounter for other specified aftercare: Secondary | ICD-10-CM | POA: Diagnosis not present

## 2012-08-22 DIAGNOSIS — L608 Other nail disorders: Secondary | ICD-10-CM

## 2012-08-22 DIAGNOSIS — L609 Nail disorder, unspecified: Secondary | ICD-10-CM

## 2012-08-22 DIAGNOSIS — B353 Tinea pedis: Secondary | ICD-10-CM | POA: Diagnosis not present

## 2012-08-22 DIAGNOSIS — R0989 Other specified symptoms and signs involving the circulatory and respiratory systems: Secondary | ICD-10-CM

## 2012-08-22 DIAGNOSIS — L97529 Non-pressure chronic ulcer of other part of left foot with unspecified severity: Secondary | ICD-10-CM

## 2012-08-22 NOTE — Telephone Encounter (Signed)
Pt is scheduled for ABI test on Tuesday March 18th,2014 at 10am at admitting at [place] [facility]. Phone # is [phone]. No pre-cert is needed with him having medicare

## 2012-08-22 NOTE — Telephone Encounter (Signed)
Message copied by Florestine Avers on Tue Aug 22, 2012 11:41 AM ------      Message from: Carlena Hurl      Created: Tue Aug 22, 2012  8:23 AM       pls set him up for ABIs with vascular or the hospital.  I want to screen for peripheral vascular disease in the legs.  ABI - ankle brachial index.            Dx: foot wound, decreased lower extremity pulses ------
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
## 2012-08-22 NOTE — Telephone Encounter (Signed)
[phone] 

## 2012-08-23 ENCOUNTER — Ambulatory Visit: Payer: Medicare Other | Admitting: Medical

## 2012-08-29 ENCOUNTER — Ambulatory Visit (HOSPITAL_COMMUNITY)
Admission: RE | Admit: 2012-08-29 | Discharge: 2012-08-29 | Disposition: A | Discharge status: Home / Self Care | Payer: Medicare Other | Source: Ambulatory Visit | Attending: Medical | Admitting: Medical

## 2012-08-29 DIAGNOSIS — L97309 Non-pressure chronic ulcer of unspecified ankle with unspecified severity: Secondary | ICD-10-CM | POA: Diagnosis not present

## 2012-08-29 DIAGNOSIS — L97509 Non-pressure chronic ulcer of other part of unspecified foot with unspecified severity: Secondary | ICD-10-CM | POA: Diagnosis not present

## 2012-08-29 DIAGNOSIS — R0989 Other specified symptoms and signs involving the circulatory and respiratory systems: Secondary | ICD-10-CM | POA: Diagnosis not present

## 2012-08-29 DIAGNOSIS — L97529 Non-pressure chronic ulcer of other part of left foot with unspecified severity: Secondary | ICD-10-CM

## 2012-08-29 NOTE — Progress Notes (Signed)
VASCULAR LAB PRELIMINARY  ARTERIAL  ABI completed:  Bilateral ABIs within normal limits.    RIGHT    LEFT    PRESSURE WAVEFORM  PRESSURE WAVEFORM  BRACHIAL 160 Triphasic  BRACHIAL 150 Triphasic   DP   DP    AT 202 Triphasic  AT 193 Triphasic   PT 201 Biphasic  PT 191 Triphasic   PER   PER 187 Triphasic   GREAT TOE  NA GREAT TOE  NA    RIGHT LEFT  ABI >1 >1     Reis Pienta, RVT 08/29/2012, 12:29 PM

## 2012-09-11 ENCOUNTER — Other Ambulatory Visit: Payer: Self-pay | Admitting: Medical

## 2012-09-11 ENCOUNTER — Telehealth: Payer: Self-pay | Admitting: Medical

## 2012-09-11 ENCOUNTER — Ambulatory Visit: Payer: Medicare Other | Admitting: Oncology

## 2012-09-11 MED ORDER — TERAZOSIN HCL 2 MG PO CAPS: 2.0000 mg | ORAL_CAPSULE | Freq: Every day | ORAL | Status: DC

## 2012-09-11 MED ORDER — MUPIROCIN 2 % EX OINT: TOPICAL_OINTMENT | Freq: Three times a day (TID) | CUTANEOUS | Status: DC

## 2012-09-11 NOTE — Telephone Encounter (Signed)
PT CALLED REQUESTING REFILLS. HE NEEDS TERAZOSIN 2% AND HE WOULD ALSO LIKE MUPIROCIN CREAM. HE STATES THAT LEG IS MUCH BETTER BUT HE WOULD LIKE TO CONTINUE ON IT. [facility] S[facility] TO [facility] [address]

## 2012-09-15 ENCOUNTER — Telehealth: Payer: Self-pay | Admitting: Internal Medicine

## 2012-09-18 ENCOUNTER — Other Ambulatory Visit: Payer: Self-pay | Admitting: Medical

## 2012-09-18 MED ORDER — FLUOXETINE HCL 40 MG PO CAPS: 40.0000 mg | ORAL_CAPSULE | Freq: Every day | ORAL | Status: DC

## 2012-09-18 NOTE — Telephone Encounter (Signed)
Another request come in again for fluozetine 40mg  #90

## 2012-09-19 NOTE — Telephone Encounter (Signed)
done

## 2012-11-02 ENCOUNTER — Telehealth: Payer: Self-pay | Admitting: *Deleted

## 2012-11-02 NOTE — Telephone Encounter (Signed)
sw pt gv appt for 12/05/12 @1pm  for labs and ov. Pt is aware...td

## 2012-12-05 ENCOUNTER — Ambulatory Visit (HOSPITAL_BASED_OUTPATIENT_CLINIC_OR_DEPARTMENT_OTHER): Payer: Medicare Other | Admitting: Oncology

## 2012-12-05 ENCOUNTER — Telehealth: Payer: Self-pay | Admitting: *Deleted

## 2012-12-05 ENCOUNTER — Other Ambulatory Visit (HOSPITAL_BASED_OUTPATIENT_CLINIC_OR_DEPARTMENT_OTHER): Payer: Medicare Other | Admitting: Lab

## 2012-12-05 VITALS — BP 146/62 | HR 76 | Temp 98.3°F | Resp 20 | Ht 74.0 in | Wt 190.6 lb

## 2012-12-05 DIAGNOSIS — D473 Essential (hemorrhagic) thrombocythemia: Secondary | ICD-10-CM

## 2012-12-05 LAB — CBC WITH DIFFERENTIAL/PLATELET
Basophils Absolute: 0.3 10*3/uL — ABNORMAL HIGH (ref 0.0–0.1)
EOS%: 1.2 % (ref 0.0–7.0)
Eosinophils Absolute: 0.2 10*3/uL (ref 0.0–0.5)
HCT: 33.3 % — ABNORMAL LOW (ref 38.4–49.9)
HGB: 10.5 g/dL — ABNORMAL LOW (ref 13.0–17.1)
MCH: 31.3 pg (ref 27.2–33.4)
MCV: 99.4 fL — ABNORMAL HIGH (ref 79.3–98.0)
MONO%: 11.7 % (ref 0.0–14.0)
NEUT%: 65 % (ref 39.0–75.0)

## 2012-12-05 MED ORDER — ANAGRELIDE HCL 0.5 MG PO CAPS: 0.5000 mg | ORAL_CAPSULE | Freq: Every day | ORAL | Status: DC

## 2012-12-05 NOTE — Telephone Encounter (Signed)
sw pt due to he by passed me made him aware that he is scheduled for labs every 12 weeks. He is aware that i will place a letter/avs in the mail for him. Dr. Lora Paula stated that it was ok that he didn't get labs after his ov due to he had them before the ov...td

## 2012-12-05 NOTE — Progress Notes (Signed)
ID: Nicholas [name]   DOB: Feb 08, 1928  MR#: [id number]  [id number]  PCP: Nicholas Haste, MD GYN: SU:  OTHER MD:   HISTORY OF PRESENT ILLNESS: Patient was initially diagnosed with essential thrombocytosis in December of 2003. He has been followed regularly with labs since that time, with his disease process controlled with anagrelide 0.5 mg and aspirin 81 mg daily.   INTERVAL HISTORY: Nicholas [name] returns today for followup of his essential thrombocytosis. Interval history is unremarkable. 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 He is tolerating the anagrelide with no side effects. He has some bruising partly from senile purpura partly from aspirin, but not worse than before  REVIEW OF SYSTEMS: He has mild sinus problems and we discussed is taking loratadine as needed for that. He has a minimal dry cough, no fever, no phlegm, no pleurisy. He has some arthritis pains here in there are no worse than before and rare headaches, which he controls with Tylenol. Otherwise a detailed review of systems was noncontributory.  PAST MEDICAL HISTORY: Past Medical History  Diagnosis Date  . Anxiety   . Dysthymia   . Hypertension   . Allergy   . Chronic sinusitis   . H/O echocardiogram 01/04/2007    LV function normal, 55-60% EF, mildly increased thickenss of LV wall  . Abnormal CT scan, sinus 10/04/06    pansinusitis, mildly deviated septum  . History of MRI of brain and brain stem 02/26/06    sinusitis, nonspecific deep white matter changes normal for age  . Essential thrombocytosis     sees hematology; Dr. Jana [name]    PAST SURGICAL HISTORY: Past Surgical History  Procedure Laterality Date  . Hernia repair      right inguinal repair  . Varicose vein surgery      right lower leg  . Tonsillectomy      childhood    FAMILY HISTORY Family History  Problem Relation Age of Onset  . Heart disease Father   . Stroke Father   . Cancer Neg Hx   . COPD Neg Hx   . Diabetes Neg Hx      SOCIAL HISTORY: The patient lives with his wife, who suffers from diabetes and emphysema.   ADVANCED DIRECTIVES:  HEALTH MAINTENANCE: History  Substance Use Topics  . Smoking status: Former Smoker    Quit date: 06/14/1958  . Smokeless tobacco: Never Used  . Alcohol Use: No     Colonoscopy:  PSA:  Bone density:  Lipid panel:  Allergies  Allergen Reactions  . Erythromycin     Current Outpatient Prescriptions  Medication Sig Dispense Refill  . anagrelide (AGRYLIN) 0.5 MG capsule Take 1 capsule (0.5 mg total) by mouth daily.  90 capsule  12  . aspirin 81 MG tablet Take 81 mg by mouth daily.        [name] FLUoxetine (PROZAC) 40 MG capsule Take 1 capsule (40 mg total) by mouth daily. 1 tablet QHS  30 capsule  2  . ibuprofen (ADVIL,MOTRIN) 200 MG tablet Take 200 mg by mouth every 6 (six) hours as needed.      . mupirocin ointment (BACTROBAN) 2 % Apply topically 3 (three) times daily.  22 g  0  . sodium chloride (OCEAN) 0.65 % nasal spray Place 1 spray into the nose as needed.      . terazosin (HYTRIN) 2 MG capsule Take 1 capsule (2 mg total) by mouth at bedtime.  90 capsule  1  . terbinafine (LAMISIL  AT) 1 % cream Apply topically 2 (two) times daily.  30 g  0   No current facility-administered medications for this visit.   Facility-Administered Medications Ordered in Other Visits  Medication Dose Route Frequency Provider Last Rate Last Dose  . influenza  inactive virus vaccine (FLUZONE/FLUARIX) injection 0.5 mL  0.5 mL Intramuscular Once Nicholas Lung, MD        OBJECTIVE: Elderly white male in no acute distress Filed Vitals:   12/05/12 1218  BP: 146/62  Pulse: 76  Temp: 98.3 F (36.8 C)  Resp: 20     Body mass index is 24.46 kg/(m^2).    ECOG FS: 1  Sclerae unicteric Oropharynx clear No cervical or supraclavicular adenopathy Lungs no rales or rhonchi Heart regular rate and rhythm Abd benign MSK no focal spinal tenderness, no peripheral edema Neuro:  nonfocal Skin: Bruises particularly or the dorsum of the hands. No evidence of cellulitis or skin breakdown  LAB RESULTS: Lab Results  Component Value Date   WBC 12.6* 12/05/2012   NEUTROABS 8.2* 12/05/2012   HGB 10.5* 12/05/2012   HCT 33.3* 12/05/2012   MCV 99.4* 12/05/2012   PLT 412* 12/05/2012      Chemistry      Component Value Date/Time   NA 143 03/24/2012 1029   K 4.5 03/24/2012 1029   CL 107 03/24/2012 1029   CO2 25 03/24/2012 1029   BUN 16 03/24/2012 1029   CREATININE 1.29 03/24/2012 1029      Component Value Date/Time   CALCIUM 9.1 03/24/2012 1029   ALKPHOS 57 03/24/2012 1029   AST 17 03/24/2012 1029   ALT 12 03/24/2012 1029   BILITOT 0.5 03/24/2012 1029       STUDIES: No results found.   ASSESSMENT: 79 y.o.  [place] man with a history of essential thrombocytosis initially diagnosed December 2003, controlled on anagrelide 0.5 mg daily and aspirin 81 mg daily.   PLAN: The patient is doing fine from my point of view, with no evidence of transformation, and good platelet control on the anagrelide. His moderate anemia is chronic and stable.  We will continue to check his lab work every 3 months, and we will see him again in one year. We are continuing the anagrelide and aspirin at the same dose as before, and I wrote him a prescription for the anagrelide today. He knows to call for any bleeding or other problems that may develop before the next visit.  Nicholas [name] C    12/05/2012

## 2012-12-14 ENCOUNTER — Other Ambulatory Visit: Payer: Self-pay | Admitting: Medical

## 2012-12-18 ENCOUNTER — Ambulatory Visit: Payer: Self-pay | Admitting: Family Medicine

## 2013-02-27 ENCOUNTER — Other Ambulatory Visit (HOSPITAL_BASED_OUTPATIENT_CLINIC_OR_DEPARTMENT_OTHER): Payer: Medicare Other

## 2013-02-27 DIAGNOSIS — D473 Essential (hemorrhagic) thrombocythemia: Secondary | ICD-10-CM | POA: Diagnosis not present

## 2013-02-27 LAB — CBC WITH DIFFERENTIAL/PLATELET
Basophils Absolute: 0.2 10*3/uL — ABNORMAL HIGH (ref 0.0–0.1)
EOS%: 1.2 % (ref 0.0–7.0)
HCT: 30 % — ABNORMAL LOW (ref 38.4–49.9)
HGB: 10 g/dL — ABNORMAL LOW (ref 13.0–17.1)
MCH: 31.7 pg (ref 27.2–33.4)
MCV: 95.3 fL (ref 79.3–98.0)
MONO%: 10.6 % (ref 0.0–14.0)
NEUT%: 72.1 % (ref 39.0–75.0)

## 2013-03-08 ENCOUNTER — Other Ambulatory Visit (INDEPENDENT_AMBULATORY_CARE_PROVIDER_SITE_OTHER): Payer: Medicare Other

## 2013-03-08 DIAGNOSIS — Z23 Encounter for immunization: Secondary | ICD-10-CM

## 2013-03-12 ENCOUNTER — Other Ambulatory Visit: Payer: Self-pay | Admitting: Medical

## 2013-03-12 ENCOUNTER — Telehealth: Payer: Self-pay | Admitting: Medical

## 2013-03-12 MED ORDER — TERAZOSIN HCL 2 MG PO CAPS: 2.0000 mg | ORAL_CAPSULE | Freq: Every day | ORAL | Status: DC

## 2013-03-12 NOTE — Telephone Encounter (Signed)
Pt called and needs refill on [facility] [address]

## 2013-03-19 ENCOUNTER — Other Ambulatory Visit: Payer: Self-pay | Admitting: Medical

## 2013-03-19 ENCOUNTER — Telehealth: Payer: Self-pay | Admitting: Medical

## 2013-03-19 MED ORDER — FLUOXETINE HCL 40 MG PO CAPS: 40.0000 mg | ORAL_CAPSULE | Freq: Every day | ORAL | Status: DC

## 2013-03-19 NOTE — Telephone Encounter (Signed)
Pt needs refill on prozac, uses [facility] [address]

## 2013-03-19 NOTE — Telephone Encounter (Signed)
Refill sent, and time for recheck

## 2013-03-20 NOTE — Telephone Encounter (Signed)
Patient is aware that he will need to schedule a follow up appointment. CLS

## 2013-04-04 ENCOUNTER — Encounter: Payer: Self-pay | Admitting: Medical

## 2013-04-04 ENCOUNTER — Other Ambulatory Visit: Payer: Self-pay | Admitting: Medical

## 2013-04-04 ENCOUNTER — Ambulatory Visit (INDEPENDENT_AMBULATORY_CARE_PROVIDER_SITE_OTHER): Payer: Medicare Other | Admitting: Medical

## 2013-04-04 VITALS — BP 124/70 | HR 81 | Resp 14 | Ht 73.0 in | Wt 187.0 lb

## 2013-04-04 DIAGNOSIS — J309 Allergic rhinitis, unspecified: Secondary | ICD-10-CM

## 2013-04-04 DIAGNOSIS — K029 Dental caries, unspecified: Secondary | ICD-10-CM

## 2013-04-04 DIAGNOSIS — D473 Essential (hemorrhagic) thrombocythemia: Secondary | ICD-10-CM

## 2013-04-04 DIAGNOSIS — J329 Chronic sinusitis, unspecified: Secondary | ICD-10-CM | POA: Insufficient documentation

## 2013-04-04 DIAGNOSIS — F329 Major depressive disorder, single episode, unspecified: Secondary | ICD-10-CM

## 2013-04-04 DIAGNOSIS — I10 Essential (primary) hypertension: Secondary | ICD-10-CM

## 2013-04-04 DIAGNOSIS — R7301 Impaired fasting glucose: Secondary | ICD-10-CM | POA: Diagnosis not present

## 2013-04-04 DIAGNOSIS — H9190 Unspecified hearing loss, unspecified ear: Secondary | ICD-10-CM

## 2013-04-04 DIAGNOSIS — H9193 Unspecified hearing loss, bilateral: Secondary | ICD-10-CM

## 2013-04-04 LAB — LIPID PANEL
HDL: 29 mg/dL — ABNORMAL LOW (ref >39)
LDL Cholesterol: 70 mg/dL (ref 0–99)
Triglycerides: 110 mg/dL (ref <150)
VLDL: 22 mg/dL (ref 0–40)

## 2013-04-04 LAB — HEMOGLOBIN A1C: Mean Plasma Glucose: 108 mg/dL (ref <117)

## 2013-04-04 LAB — COMPREHENSIVE METABOLIC PANEL
Alkaline Phosphatase: 59 U/L (ref 39–117)
BUN: 22 mg/dL (ref 6–23)
Creat: 1.58 mg/dL — ABNORMAL HIGH (ref 0.50–1.35)
Glucose, Bld: 92 mg/dL (ref 70–99)
Total Bilirubin: 0.5 mg/dL (ref 0.3–1.2)

## 2013-04-04 LAB — POCT URINALYSIS DIPSTICK
Bilirubin, UA: NEGATIVE
Blood, UA: NEGATIVE
Glucose, UA: NEGATIVE
Ketones, UA: NEGATIVE
Spec Grav, UA: 1.01
Urobilinogen, UA: NEGATIVE

## 2013-04-04 NOTE — Progress Notes (Addendum)
Subjective:    Nicholas [name] is a 79 y.o. male who presents for med check/routine followup.  Names of Other Physician/Practitioners you currently use: 1. TYSINGER, DAVID SHANE, PA-C here for primary care 2. eye doctor, last visit >1 year ago 3. Dentist, Dr. Lin Landsman, last visit within the year 4. Dermatology, Dr. Tonia Brooms within the year 5. Dr. Jana Hakim, Hematology/Oncology for thrombocytosis  Been doing relatively well.  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   Enjoys this.   He reports no current mood issues ,medication working fine.   He has seen Dr. Jana Hakim, dermatology, and dentist in the last 55mo.  He has no specific c/o.  Medical Services you may have received from other than [facility] providers in the past year (date may be approximate) none  Preventative care: Last colonoscopy: never, has declined  History reviewed: allergies, current medications, past family history, past medical history, past social history, past surgical history and problem list  Current Problems (verified) Patient Active Problem List   Diagnosis Date Noted  . Sinusitis, chronic 04/04/2013  . Depression 04/04/2013  . Thrombocytosis 03/30/2011  . Hearing decreased 10/21/2010  . Essential hypertension, benign 10/21/2010  . Dysthymia 10/21/2010     Immunization History  Administered Date(s) Administered  . Influenza Split 03/29/2011, 03/14/2012  . Influenza Whole 03/26/2008, 04/24/2009, 04/24/2010  . Influenza,inj,Quad PF,36+ Mos 03/08/2013  . Pneumococcal Polysaccharide 03/24/2012  . Tdap 08/23/2011    Risk Factors: Tobacco History  Smoking status  . Former Smoker  . Quit date: 06/14/1958  Smokeless tobacco  . Never Used   Alcohol Current alcohol use: no alcohol use  Caffeine Current caffeine use: limited  Exercise Current exercise habits: walks some for exercise   Nutrition/Diet Current diet: in general, a  "healthy" diet  , on average, 3 meals per day  Cardiac risk factors: advanced age (older than 94 for men, 58 for women), hypertension, male gender and sedentary lifestyle.  Vision Difficulties: Yes only close up.   Far vision fine.  Hearing Difficulties: Yes Do you often ask people to speak up or repeat themselves? Yes Do you experience ringing or noises in your ears? No Do you have difficulty understanding soft or whispered voices? Yes  Cognition  Do you feel that you have a problem with memory? Yes, sometimes forgets names, no major changes  Do you often misplace items? No  Do you feel safe at home?  Yes   Objective:   BP 124/70  Pulse 81  Resp 14  Ht 6\' 1"  (1.854 m)  Wt 187 lb (84.823 kg)  BMI 24.68 kg/m2  SpO2 97%  General appearance: alert, no distress, WD/WN, elderly white male Cognitive Testing  Alert? Yes  Normal Appearance?Yes  Oriented to person? Yes  Place? Yes HEENT: normocephalic, sclerae anicteric, TMs pearly, nares patent, deviated septum to the left, no discharge or erythema, pharynx normal Oral cavity: MMM, moderate plaque, left lower incisor with some decay Neck: supple, no lymphadenopathy, no thyromegaly, no masses Heart: RRR, normal S1, S2, no murmurs Lungs: CTA bilaterally, no wheezes, rhonchi, or rales Abdomen: +bs, soft, non tender, small umbilical reducible hernia, nontender, non distended, no masses, no hepatomegaly, no splenomegaly Musculoskeletal: nontender, no swelling, no obvious deformity Extremities: no edema, no cyanosis, no clubbing Pulses: 2+ symmetric, upper and lower extremities, normal cap refill Neurological: alert, oriented x 3, CN2-12 intact, strength normal upper extremities and lower extremities, sensation normal throughout, DTRs 2+  throughout, no cerebellar signs, gait normal Psychiatric: normal affect, behavior normal, pleasant    Adult ECG Report  Indication: HTN  Rate: 69bpm  Rhythm: normal sinus rhythm  QRS Axis: 10  degrees  PR Interval: 127ms  QRS Duration: 53ms  QTc: 449ms  Conduction Disturbances: none  Other Abnormalities: none  Patient's cardiac risk factors are: advanced age (older than 30 for men, 41 for women), hypertension and male gender.  EKG comparison: 03/29/11  Narrative Interpretation: normal EKG    Assessment:   Encounter Diagnoses  Name Primary?  . Essential hypertension, benign Yes  . Thrombocytosis   . Depression   . Impaired fasting glucose   . Allergic rhinitis   . Sinusitis, chronic   . Tooth decay   . Disturbed hearing, bilateral     Plan:   During the course of the visit the patient was educated and counseled about appropriate screening and preventive services including:   Diabetes screening  Glaucoma screening  Nutrition counseling   Screening recommendations, referrals: Vaccinations: up to date except shingles.  Counseled on shingles vaccine.    Nutrition assessed and recommended 3 meals daily, healthy diet.  Recommended yearly ophthalmology/optometry visit for glaucoma screening and checkup  Recommended yearly dental visit for hygiene and checkup  See eye doctor soon for general checkup  See dentist soon for tooth decay/repair  Consider seeing hearing specialist if having worse problems.  See dermatology yearly  See Dr. Jana Hakim yearly  Conditions/risks identified:  Hearing screen a little worse than prior.  Likewise, vision screen worse than prior Left lower incisor tooth with decay. Mild memory changes. Call insurer about cost/coverage for shingles vaccine. Instead of mucinex daily, use OTC Zyrtec at bedtime.  Reserve Mucinex for worse congestion.   Medicare Attestation I have personally reviewed: The patient's medical and social history Their use of alcohol, tobacco or illicit drugs Their current medications and supplements The patient's functional ability including ADLs,fall risks, home safety risks, cognitive, and hearing and  visual impairment Diet and physical activities Evidence for depression or mood disorders  The patient's weight, height, BMI, and visual acuity have been recorded in the chart.  I have made referrals, counseling, and provided education to the patient based on review of the above and I have provided the patient with a written personalized care plan for preventive services.     Crisoforo Oxford, PA-C   04/04/2013

## 2013-04-04 NOTE — Patient Instructions (Addendum)
MEDICARE PREVENTATIVE SERVICES (MALE) AND PERSONALIZED PLAN for  Nicholas [name] April 04, 2013  CONDITIONS OR RISKS IDENTIFIED TODAY: Hearing screen a little worse than prior.  Likewise, vision screen worse than prior Left lower incisor tooth with decay. Mild memory changes. Call insurer about cost/coverage for shingles vaccine. Instead of mucinex daily, use OTC Zyrtec at bedtime.  Reserve Mucinex for worse congestion.   SPECIFIC RECOMMENDATIONS:  See eye doctor soon for general checkup  See dentist soon for tooth decay/repair  Consider seeing hearing specialist if having worse problems.  See dermatology yearly  See Dr. Jana Hakim yearly, and get your routine labs through hematology every 3 months as usual   Your vaccines are up to date.   Work on getting routine exercise such as walking Try and get 3 meals daily, healthy variety of foods.  Limit salt, fast food, fried foods.   Return in 3 months to check on weight, memory, and mood.   GENERAL RECOMMENDATIONS FOR GOOD HEALTH:  Supplements:    Take a daily baby Aspirin 81mg  at bedtime for heart health unless you have a history of gastrointestinal bleed, allergy to aspirin, or are already taking higher dose Aspirin or other antiplatelet or blood thinner medication.     Consume 1200 mg of Calcium daily through dietary calcium or supplement if you are male age 27 or older, or men 2 and older.   Men aged 33-70 should consume 1000 mg of Calcium daily.   Take 600 IU of Vitamin D daily.  Take 800 IU of Calcium daily if you are older than age 47.    Take a general multivitamin daily.   Healthy diet: Eat a variety of foods, including fruits, vegetables, vegetable protein such as beans, lentils, tofu, and grains, such as rice.  Limit meat or animal protein, but if you eat meat, choose leans cuts such as chicken, fish, or [place].  Drink plenty of water daily.  Decrease saturated fat in the diet, avoid lots of red meat, processed  foods, sweets, fast foods, and fried foods.  Limit salt and caffeine intake.  Exercise: Aerobic exercise helps maintain good heart health. Weight bearing exercise helps keep bones and muscles working strong.  We recommend at least 30-40 minutes of exercise most days of the week.   Fall prevention: Falls are the leading cause of injuries, accidents, and accidental deaths in people over the age of 29. Falling is a real threat to your ability to live on your own.  Causes include poor eyesight or poor hearing, illness, poor lighting, throw rugs, clutter in your home, and medication side effects causing dizziness or balance problems.  Such medications can include medications for depression, sleep problems, high blood pressure, diabetes, and heart conditions.   PREVENTION  Be sure your home is as safe as possible. Here are some tips:  Wear shoes with non-skid soles (not house slippers).   Be sure your home and outside area are well lit.   Use night lights throughout your house, including hallways and stairways.   Remove clutter and clean up spills on floors and walkways.   Remove throw rugs or fasten them to the floor with carpet tape. Tack down carpet edges.   Do not place electrical cords across pathways.   Install grab bars in your bathtub, shower, and toilet area. Towel bars should not be used as a grab bar.   Install handrails on both sides of stairways.   Do not climb on stools or stepladders. Get  someone else to help with jobs that require climbing.   Do not wax your floors at all, or use a non-skid wax.   Repair uneven or unsafe sidewalks, walkways or stairs.   Keep frequently used items within reach.   Be aware of pets so you do not trip.  Get regular check-ups from your doctor, and take good care of yourself:  Have your eyes checked every year for vision changes, cataracts, glaucoma, and other eye problems. Wear eyeglasses as directed.   Have your hearing checked every 2  years, or anytime you or others think that you cannot hear well. Use hearing aids as directed.   See your caregiver if you have foot pain or corns. Sore feet can contribute to falls.   Let your caregiver know if a medicine is making you feel dizzy or making you lose your balance.   Use a cane, walker, or wheelchair as directed. Use walker or wheelchair brakes when getting in and out.   When you get up from bed, sit on the side of the bed for 1 to 2 minutes before you stand up. This will give your blood pressure time to adjust, and you will feel less dizzy.   If you need to go to the bathroom often, consider using a bedside commode.  Disease prevention:  If you smoke or chew tobacco, find out from your caregiver how to quit. It can literally save your life, no matter how long you have been a tobacco user. If you do not use tobacco, never begin. Medicare does cover some smoking cessation counseling.  Maintain a healthy diet and normal weight. Increased weight leads to problems with blood pressure and diabetes. We check your height, weight, and BMI as part of your yearly visit.  The Body Mass Index or BMI is a way of measuring how much of your body is fat. Having a BMI above 27 increases the risk of heart disease, diabetes, hypertension, stroke and other problems related to obesity. Your caregiver can help determine your BMI and based on it develop an exercise and dietary program to help you achieve or maintain this important measurement at a healthful level.  High blood pressure causes heart and blood vessel problems.  Persistent high blood pressure should be treated with medicine if weight loss and exercise do not work.  We check your blood pressure as part of your yearly visit.  Avoid drinking alcohol in excess (more than two drinks per day).  Avoid use of street drugs. Do not share needles with anyone. Ask for professional help if you need assistance or instructions on stopping the use of  alcohol, cigarettes, and/or drugs.  Brush your teeth twice a day with fluoride toothpaste, and floss once a day. Good oral hygiene prevents tooth decay and gum disease. The problems can be painful, unattractive, and can cause other health problems. Visit your dentist for a routine oral and dental checkup and preventive care every 6-12 months.   See your eye doctor yearly for routine screening for things like glaucoma.  Look at your skin regularly.  Use a mirror to look at your back. Notify your caregivers of changes in moles, especially if there are changes in shapes, colors, a size larger than a pencil eraser, an irregular border, or development of new moles.  Safety:  Use seatbelts 100% of the time, whether driving or as a passenger.  Use safety devices such as hearing protection if you work in environments with loud noise or  significant background noise.  Use safety glasses when doing any work that could send debris in to the eyes.  Use a helmet if you ride a bike or motorcycle.  Use appropriate safety gear for contact sports.  Talk to your caregiver about gun safety.  Use sunscreen with a SPF (or skin protection factor) of 15 or greater.  Lighter skinned people are at a greater risk of skin cancer. Don't forget to also wear sunglasses in order to protect your eyes from too much damaging sunlight. Damaging sunlight can accelerate cataract formation.   If you have multiple sexual partners, or if you are not in a monogamous relationship, practice safe sex. Use condoms. Condoms are used to help reduce the spread of sexually transmitted infections (or STIs).  Consider an HIV test if you have never been tested.  Consider routine screening for STIs if you have multiple sexual partners.   Keep carbon monoxide and smoke detectors in your home functioning at all times. Change the batteries every 6 months or use a model that plugs into the wall or is hard wired in.   END OF LIFE PLANNING/ADVANCED  DIRECTIVES Advance health-care planning is deciding the kind of care you want at the end of life. While alert competent adults are able to exercise their rights to make health care and financial decisions, problems arise when an individual becomes unconscious, incapacitated, or otherwise unable to communicate or make such decisions. Advance health care directives are the legal documents in which you give written instructions about your choices limited, aggressive or palliative care if, in the future, you cannot speak for yourself.  Advanced directives include the following: [place] allows you to appoint someone to act as your health care agent to make health care decisions for you should it be determined by your health care provider that you are no longer able to make these decisions for yourself.  A Living Will is a legal document in which you can declare that under certain conditions you desire your life not be prolonged by extraordinary or artificial means during your last illness or when you are near death. We can provide you with sample advanced directives, you can get an attorney to prepare these for you, or you can visit [place] Secretary of State's website for additional information and resources at [email or web address]  Further, I recommend you have an attorney prepare a Will and Durable Power of Attorney if you haven't done so already.  Please get [place] a copy of your health care Advanced Directives.   PREVENTATIV E CARE RECOMMENDATIONS:  Vaccinations: We recommend the following vaccinations as part of your preventative care:  Pneumococcal vaccine is recommended to protect against certain types of pneumonia.  This is normally recommended for adults age 23 or older once, or up to every 5 years for those at high risk.  The vaccine is also recommended for adults younger than 77 years old with certain underlying conditions that make them high risk for  pneumonia.  Influenza vaccine is recommended to protect against seasonal influenza or "the flu." Influenza is a serious disease that can lead to hospitalization and sometimes even death. Traditional flu vaccines (called trivalent vaccines) are made to protect against three flu viruses; an influenza A (H1N1) virus, an influenza A (H3N2) virus, and an influenza B virus. In addition, there are flu vaccines made to protect against four flu viruses (called "quadrivalent" vaccines). These vaccines protect against the same viruses as the trivalent vaccine and  an additional B virus.  We recommend the high dose influenza vaccine to those 65 years and older.  Hepatitis B vaccine to protect against a form of infection of the liver by a virus acquired from blood or body fluids, particularly for high risk groups.  Td or Tdap vaccine to protect against Tetanus, diphtheria and pertussis which can be very serious.  These diseases are caused by bacteria.  Diphtheria and pertussis are spread from person to person through coughing or sneezing.  Tetanus enters the body through cuts, scratches, or wounds.  Tetanus (Lockjaw) causes painful muscle tightening and stiffness, usually all over the body.  Diphtheria can cause a thick coating to form in the back of the throat.  It can lead to breathing problems, paralysis, heart failure, and death.  Pertussis (Whooping Cough) causes severe coughing spells, which can cause difficulty breathing, vomiting and disturbed sleep.  Td or Tdap is usually given every 10 years.  Shingles vaccine to protect against Varicella Zoster if you are older than age 43, or younger than 77 years old with certain underlying illness.    Cancer Screening: Most routine colon cancer screening begins at the age of 50.  Subsequent colonoscopies are performed either every 5-10 years for normal screening, or every 2-5 years for higher risks patients, up until age 21 years of age. Annual screening is done with  easy to use take-home tests to check for hidden blood in the stool called hemoccult tests.  Sigmoidoscopy or colonoscopy can detect the earliest forms of colon cancer and is life saving. These tests use a small camera at the end of a tube to directly examine the colon.   Prostate cancer screening usually begins at age 24 years old, or younger age for those with higher risk.  Those at higher risk include African-Americans or having a family history of prostate cancer. There are two types of tests for prostate cancer - Prostate-specific antigen (PSA) testing. Recent studies raise questions about prostate cancer using PSA and you should discuss this with your caregiver.  The other type of test is the digital rectal exam (in which your doctor's lubricated and gloved finger feels for enlargement of the prostate through the anus).  We routinely stop testing at age 30 years of age.  Osteoporosis Screening: Screening for osteoporosis usually begins at age 46 for women, and can be done as frequent as every 2 years.  However, women or men with higher risk of osteoporosis may be screened earlier than age 27.  Osteoporosis or low bone mass is diminished bone strength from alterations in bone architecture leading to bone fragility and increased fracture risk.     Cardiovascular Screening: Fat and cholesterol leaves deposits in your arteries that can block them. This causes heart disease and vessel disease elsewhere in your body.  If your cholesterol is found to be high, or if you have heart disease or certain other medical conditions, then you may need to have your cholesterol monitored frequently and be treated with medication. Cardiovascular screening in the form of lab tests for cholesterol, HDL and triglycerides can be done every 5 years.  A screening electrocardiogram can be done as part of the Welcome to Medicare physical.  Diabetes Screening: Diabetes screening can be done at least every 3 years for those with  risk factors,  or every 6-12 months for prediabetic patients.  Screening includes fasting blood sugar test or glucose tolerance test.  Risk factors include hypertension, dyslipidemia, obesity, previously abnormal glucose tests,  family history of diabetes, age 31 years or older, and history of gestations diabetes.   AAA (abdominal aortic aneurysm) Screening: Medicare allows for a one time ultrasound to screen for abdominal aortic aneurysm if done as a referral as part of the Welcome to Medicare exam.  Men eligible for this screening include those men between age 65-60 years of age who have smoked at least 100 cigarettes in his lifetime and/or has a family history of AAA.  HIV Screening:  Medicare allows for yearly screening for patients at high risk for contracting HIV disease.

## 2013-04-05 ENCOUNTER — Encounter: Payer: Self-pay | Admitting: Medical

## 2013-04-05 ENCOUNTER — Other Ambulatory Visit: Payer: Self-pay | Admitting: Medical

## 2013-04-05 DIAGNOSIS — R7989 Other specified abnormal findings of blood chemistry: Secondary | ICD-10-CM

## 2013-04-05 LAB — T4, FREE: Free T4: 1.16 ng/dL (ref 0.80–1.80)

## 2013-04-17 ENCOUNTER — Telehealth: Payer: Self-pay | Admitting: Medical

## 2013-04-17 ENCOUNTER — Other Ambulatory Visit: Payer: Self-pay | Admitting: Medical

## 2013-04-17 MED ORDER — FLUOXETINE HCL 40 MG PO CAPS: 40.0000 mg | ORAL_CAPSULE | Freq: Every day | ORAL | Status: DC

## 2013-04-17 NOTE — Telephone Encounter (Signed)
rx sent

## 2013-05-08 ENCOUNTER — Other Ambulatory Visit: Payer: Medicare Other

## 2013-05-08 DIAGNOSIS — D473 Essential (hemorrhagic) thrombocythemia: Secondary | ICD-10-CM

## 2013-05-08 DIAGNOSIS — R799 Abnormal finding of blood chemistry, unspecified: Secondary | ICD-10-CM | POA: Diagnosis not present

## 2013-05-08 DIAGNOSIS — R7989 Other specified abnormal findings of blood chemistry: Secondary | ICD-10-CM

## 2013-05-08 LAB — BUN: BUN: 23 mg/dL (ref 6–23)

## 2013-05-08 LAB — CREATININE, SERUM: Creat: 1.48 mg/dL — ABNORMAL HIGH (ref 0.50–1.35)

## 2013-05-14 ENCOUNTER — Other Ambulatory Visit: Payer: Self-pay | Admitting: Oncology

## 2013-05-14 DIAGNOSIS — D649 Anemia, unspecified: Secondary | ICD-10-CM

## 2013-05-22 ENCOUNTER — Other Ambulatory Visit: Payer: Medicare Other | Admitting: Lab

## 2013-05-28 ENCOUNTER — Telehealth: Payer: Self-pay | Admitting: *Deleted

## 2013-05-28 NOTE — Telephone Encounter (Signed)
Pt called for a lab appt. gv appt for 06/01/13 @ 3pm...td

## 2013-05-28 NOTE — Telephone Encounter (Signed)
Pt called back stating that he couldn't come in on 06/01/13. gv appt for 05/31/13@ 3:45p. Pt is aware...td

## 2013-05-31 ENCOUNTER — Other Ambulatory Visit (HOSPITAL_BASED_OUTPATIENT_CLINIC_OR_DEPARTMENT_OTHER): Payer: Medicare Other

## 2013-05-31 DIAGNOSIS — D649 Anemia, unspecified: Secondary | ICD-10-CM | POA: Diagnosis not present

## 2013-05-31 LAB — CBC WITH DIFFERENTIAL/PLATELET
BASO%: 1.2 % (ref 0.0–2.0)
Basophils Absolute: 0.2 10*3/uL — ABNORMAL HIGH (ref 0.0–0.1)
Eosinophils Absolute: 0.3 10*3/uL (ref 0.0–0.5)
HCT: 31.6 % — ABNORMAL LOW (ref 38.4–49.9)
HGB: 10.3 g/dL — ABNORMAL LOW (ref 13.0–17.1)
MCH: 31.3 pg (ref 27.2–33.4)
MCHC: 32.5 g/dL (ref 32.0–36.0)
MONO#: 2 10*3/uL — ABNORMAL HIGH (ref 0.1–0.9)
NEUT#: 11.2 10*3/uL — ABNORMAL HIGH (ref 1.5–6.5)
NEUT%: 69.3 % (ref 39.0–75.0)
RBC: 3.28 10*6/uL — ABNORMAL LOW (ref 4.20–5.82)
RDW: 19.2 % — ABNORMAL HIGH (ref 11.0–14.6)
WBC: 16.2 10*3/uL — ABNORMAL HIGH (ref 4.0–10.3)
lymph#: 2.4 10*3/uL (ref 0.9–3.3)

## 2013-06-01 ENCOUNTER — Other Ambulatory Visit: Payer: Medicare Other

## 2013-06-27 ENCOUNTER — Other Ambulatory Visit: Payer: Self-pay | Admitting: Dermatology

## 2013-06-27 DIAGNOSIS — C44319 Basal cell carcinoma of skin of other parts of face: Secondary | ICD-10-CM | POA: Diagnosis not present

## 2013-06-27 DIAGNOSIS — D485 Neoplasm of uncertain behavior of skin: Secondary | ICD-10-CM | POA: Diagnosis not present

## 2013-06-27 DIAGNOSIS — Z85828 Personal history of other malignant neoplasm of skin: Secondary | ICD-10-CM | POA: Diagnosis not present

## 2013-06-27 DIAGNOSIS — L821 Other seborrheic keratosis: Secondary | ICD-10-CM | POA: Diagnosis not present

## 2013-06-27 DIAGNOSIS — Z85820 Personal history of malignant melanoma of skin: Secondary | ICD-10-CM | POA: Diagnosis not present

## 2013-07-16 ENCOUNTER — Telehealth: Payer: Self-pay | Admitting: Internal Medicine

## 2013-07-16 MED ORDER — FLUOXETINE HCL 40 MG PO CAPS: 40.0000 mg | ORAL_CAPSULE | Freq: Every day | ORAL | Status: DC

## 2013-07-16 NOTE — Telephone Encounter (Signed)
Pt is completely out of his fluoxetine 40mg . Please send to [address]

## 2013-08-06 DIAGNOSIS — C44319 Basal cell carcinoma of skin of other parts of face: Secondary | ICD-10-CM | POA: Diagnosis not present

## 2013-08-14 ENCOUNTER — Other Ambulatory Visit (HOSPITAL_BASED_OUTPATIENT_CLINIC_OR_DEPARTMENT_OTHER): Payer: Medicare Other

## 2013-08-14 ENCOUNTER — Other Ambulatory Visit: Payer: Medicare Other

## 2013-08-14 DIAGNOSIS — D649 Anemia, unspecified: Secondary | ICD-10-CM

## 2013-08-14 DIAGNOSIS — D473 Essential (hemorrhagic) thrombocythemia: Secondary | ICD-10-CM

## 2013-08-14 LAB — CBC WITH DIFFERENTIAL/PLATELET
BASO%: 1.1 % (ref 0.0–2.0)
Basophils Absolute: 0.2 10*3/uL — ABNORMAL HIGH (ref 0.0–0.1)
EOS%: 1.3 % (ref 0.0–7.0)
Eosinophils Absolute: 0.2 10*3/uL (ref 0.0–0.5)
HEMATOCRIT: 30.5 % — AB (ref 38.4–49.9)
HGB: 9.9 g/dL — ABNORMAL LOW (ref 13.0–17.1)
LYMPH#: 2.5 10*3/uL (ref 0.9–3.3)
LYMPH%: 15.6 % (ref 14.0–49.0)
MCH: 31.2 pg (ref 27.2–33.4)
MCHC: 32.5 g/dL (ref 32.0–36.0)
MCV: 96 fL (ref 79.3–98.0)
MONO#: 1.7 10*3/uL — AB (ref 0.1–0.9)
MONO%: 10.5 % (ref 0.0–14.0)
NEUT#: 11.5 10*3/uL — ABNORMAL HIGH (ref 1.5–6.5)
NEUT%: 71.5 % (ref 39.0–75.0)
PLATELETS: 478 10*3/uL — AB (ref 140–400)
RBC: 3.18 10*6/uL — AB (ref 4.20–5.82)
RDW: 19.9 % — ABNORMAL HIGH (ref 11.0–14.6)
WBC: 16.1 10*3/uL — ABNORMAL HIGH (ref 4.0–10.3)

## 2013-08-14 LAB — TECHNOLOGIST REVIEW

## 2013-10-04 ENCOUNTER — Other Ambulatory Visit: Payer: Self-pay | Admitting: *Deleted

## 2013-10-04 DIAGNOSIS — D75839 Thrombocytosis, unspecified: Secondary | ICD-10-CM

## 2013-10-04 DIAGNOSIS — D473 Essential (hemorrhagic) thrombocythemia: Secondary | ICD-10-CM

## 2013-10-04 MED ORDER — ANAGRELIDE HCL 0.5 MG PO CAPS: 0.5000 mg | ORAL_CAPSULE | Freq: Every day | ORAL | Status: DC

## 2013-10-10 ENCOUNTER — Other Ambulatory Visit: Payer: Self-pay | Admitting: Physician Assistant

## 2013-10-10 DIAGNOSIS — I10 Essential (primary) hypertension: Secondary | ICD-10-CM

## 2013-10-11 ENCOUNTER — Telehealth: Payer: Self-pay | Admitting: Physician Assistant

## 2013-10-11 NOTE — Telephone Encounter (Signed)
per AB to chge pt appt to 6/17 so GM can see the pt too/cld & spoke w/pt to adv of new time & date

## 2013-11-06 ENCOUNTER — Other Ambulatory Visit: Payer: Medicare Other

## 2013-11-12 ENCOUNTER — Telehealth: Payer: Self-pay | Admitting: Medical

## 2013-11-12 ENCOUNTER — Other Ambulatory Visit: Payer: Self-pay | Admitting: Family Medicine

## 2013-11-12 MED ORDER — FLUOXETINE HCL 40 MG PO CAPS: 40.0000 mg | ORAL_CAPSULE | Freq: Every day | ORAL | Status: DC

## 2013-11-12 NOTE — Telephone Encounter (Signed)
I sent the refill in for Prozac to the patients pharmacy per Chana Bode [facility]. CLS

## 2013-11-12 NOTE — Telephone Encounter (Signed)
Nicholas [name], I sent the refill in for your dads medication. Audelia Acton said he will need to schedule a follow up appointment. Do you want to set this up for him?

## 2013-11-12 NOTE — Telephone Encounter (Signed)
pls send refill, make f/u appt within 1-77mo before August

## 2013-11-12 NOTE — Telephone Encounter (Signed)
Yes I will take care of. Thanks.

## 2013-11-19 ENCOUNTER — Ambulatory Visit (INDEPENDENT_AMBULATORY_CARE_PROVIDER_SITE_OTHER): Payer: Medicare Other | Admitting: Medical

## 2013-11-19 ENCOUNTER — Encounter: Payer: Self-pay | Admitting: Medical

## 2013-11-19 VITALS — BP 140/58 | HR 78 | Temp 98.0°F | Resp 16 | Wt 189.0 lb

## 2013-11-19 DIAGNOSIS — I10 Essential (primary) hypertension: Secondary | ICD-10-CM

## 2013-11-19 DIAGNOSIS — F329 Major depressive disorder, single episode, unspecified: Secondary | ICD-10-CM

## 2013-11-19 DIAGNOSIS — M25561 Pain in right knee: Secondary | ICD-10-CM

## 2013-11-19 DIAGNOSIS — M25562 Pain in left knee: Principal | ICD-10-CM

## 2013-11-19 DIAGNOSIS — F32A Depression, unspecified: Secondary | ICD-10-CM

## 2013-11-19 DIAGNOSIS — F3289 Other specified depressive episodes: Secondary | ICD-10-CM

## 2013-11-19 DIAGNOSIS — M25569 Pain in unspecified knee: Secondary | ICD-10-CM | POA: Diagnosis not present

## 2013-11-19 NOTE — Patient Instructions (Signed)
  Thank you for giving me the opportunity to serve you today.    Your diagnosis today includes: Encounter Diagnoses  Name Primary?  . Knee pain, bilateral Yes  . Essential hypertension, benign   . Depression      Specific recommendations today include:  Try using Tylenol instead of Ibuprofen for knee pains  You may use OTC creams for joint pain such as capsaicin cream for the knees  Try taking Fish Oil tablet OTC for knee pains  Check your Blood Pressure 2 x per week and bring these numbers back in 3 months for recheck  Continue your other medications as usual  Recheck in 3 months

## 2013-11-19 NOTE — Progress Notes (Signed)
Subjective: With daughter today for recheck on medications and knee pain.  Depression-in general doing okay on fluoxetine, takes it at night as it makes him too groggy if he takes it in the day.  He is still in good spirits, though he has had a way more stress in the last several months.  His wife has had 2 recent strokes, and he is now having to give her medications to her including eyedrops and insulin once to twice daily.  This is new for him. 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  She also has trouble managing her bills given the cognition changes. He does report feeling more stressed having to take care of wife.  Her main issues is cognitive decline, but she is still taking care of her ADLs. He is not having time to do a lot of exercising of late.    Compliant with blood pressure medication, check blood pressures at home sometimes.  Gets BPs in the 130/60s range.  He has ongoing bilateral knee pain, has seen Dr. Redmond [facility] for this before. Worse in the mornings when he gets up, pain if he has been sitting for too long, but once he is walking and up seems to do better.  No prior steroid injections into the knees, no prior x-ray. No recent injury fall or trauma. No swelling.  Review of systems as in subjective   Objective:  BP 140/58  Pulse 78  Temp(Src) 98 F (36.7 C) (Oral)  Resp 16  Wt 189 lb (85.73 kg) BP Readings from Last 3 Encounters:  11/19/13 140/58  04/04/13 124/70  12/05/12 146/62   Wt Readings from Last 3 Encounters:  11/19/13 189 lb (85.73 kg)  04/04/13 187 lb (84.823 kg)  12/05/12 190 lb 9.6 oz (86.456 kg)    Filed Vitals:   11/19/13 1017  BP: 140/58  Pulse: 78  Temp: 98 F (36.7 C)  Resp: 16    General appearance: alert, no distress, WD/WN,  Oral cavity: MMM, no lesions Neck: supple, no lymphadenopathy, no thyromegaly, no masses Heart: RRR, normal S1, S2, no murmurs Lungs: CTA bilaterally, no wheezes,  rhonchi, or rales Ext: no edema Legs neurovascularly intact, 2+ pulses MSK: bilat knees nontender, no laxity, no swelling, no obvious deformity, mild pain with flexion > 110 degrees, otherwise legs unremarkable, nontender   Assessment: Encounter Diagnoses  Name Primary?  . Knee pain, bilateral Yes  . Essential hypertension, benign   . Depression    Plan: Knee pain - likely OA.  Advised Tylenol instead of Ibuprofen, OTC capsicin cream, fish oil tablets daily, avoid activity that worsens.  If not improving, let me know  HTN - c/t current medication, check BPs at home , f/u 38mo  Depression - discussed his current situations, support from family, c/t Prozac.

## 2013-11-28 ENCOUNTER — Encounter: Payer: Self-pay | Admitting: Physician Assistant

## 2013-11-28 ENCOUNTER — Ambulatory Visit (HOSPITAL_BASED_OUTPATIENT_CLINIC_OR_DEPARTMENT_OTHER): Payer: Medicare Other | Admitting: Physician Assistant

## 2013-11-28 ENCOUNTER — Other Ambulatory Visit (HOSPITAL_BASED_OUTPATIENT_CLINIC_OR_DEPARTMENT_OTHER): Payer: Medicare Other

## 2013-11-28 ENCOUNTER — Telehealth: Payer: Self-pay | Admitting: Oncology

## 2013-11-28 VITALS — BP 146/58 | HR 86 | Temp 98.1°F | Resp 18 | Ht 73.0 in | Wt 191.0 lb

## 2013-11-28 DIAGNOSIS — D75839 Thrombocytosis, unspecified: Secondary | ICD-10-CM

## 2013-11-28 DIAGNOSIS — D649 Anemia, unspecified: Secondary | ICD-10-CM | POA: Insufficient documentation

## 2013-11-28 DIAGNOSIS — D473 Essential (hemorrhagic) thrombocythemia: Secondary | ICD-10-CM

## 2013-11-28 DIAGNOSIS — I10 Essential (primary) hypertension: Secondary | ICD-10-CM

## 2013-11-28 LAB — CBC WITH DIFFERENTIAL/PLATELET
BASO%: 2.7 % — AB (ref 0.0–2.0)
Basophils Absolute: 0.4 10*3/uL — ABNORMAL HIGH (ref 0.0–0.1)
EOS%: 0 % (ref 0.0–7.0)
Eosinophils Absolute: 0 10*3/uL (ref 0.0–0.5)
HCT: 29.1 % — ABNORMAL LOW (ref 38.4–49.9)
HEMOGLOBIN: 9.5 g/dL — AB (ref 13.0–17.1)
LYMPH#: 2.5 10*3/uL (ref 0.9–3.3)
LYMPH%: 16.1 % (ref 14.0–49.0)
MCH: 32.1 pg (ref 27.2–33.4)
MCHC: 32.6 g/dL (ref 32.0–36.0)
MCV: 98.3 fL — ABNORMAL HIGH (ref 79.3–98.0)
MONO#: 1.9 10*3/uL — AB (ref 0.1–0.9)
MONO%: 12.1 % (ref 0.0–14.0)
NEUT#: 10.9 10*3/uL — ABNORMAL HIGH (ref 1.5–6.5)
NEUT%: 69.1 % (ref 39.0–75.0)
Platelets: 426 10*3/uL — ABNORMAL HIGH (ref 140–400)
RBC: 2.96 10*6/uL — ABNORMAL LOW (ref 4.20–5.82)
RDW: 19.7 % — AB (ref 11.0–14.6)
WBC: 15.8 10*3/uL — AB (ref 4.0–10.3)
nRBC: 2 % — ABNORMAL HIGH (ref 0–0)

## 2013-11-28 LAB — TECHNOLOGIST REVIEW

## 2013-11-28 MED ORDER — ANAGRELIDE HCL 0.5 MG PO CAPS: 0.5000 mg | ORAL_CAPSULE | Freq: Every day | ORAL | Status: DC

## 2013-11-28 NOTE — Telephone Encounter (Signed)
per pof to sch appt-gave pt copy of sch °

## 2013-11-28 NOTE — Progress Notes (Signed)
ID: Nicholas [name]   DOB: 1927/06/26  MR#: [id number]  [id number]  PCP: Wyatt Haste, MD GYN: SU:  OTHER MD:  CHIEF COMPLAINT:  Essential thrombocytosis and chronic anemia    HISTORY OF PRESENT ILLNESS: Patient was initially diagnosed with essential thrombocytosis in December of 2003. He has been followed regularly with labs since that time, with his disease process controlled with anagrelide 0.5 mg and aspirin 81 mg daily.   INTERVAL HISTORY: Nicholas [name] returns alone today for followup of his essential thrombocytosis and chronic anemia.  Interval history is generally unremarkable, and he has no new complaints. He is still driving inactivity bus part-time and tries to keep himself busy. He continues to tolerate the anagrelide 0.5 mg and aspirin 81 mg well. He bruises easily, but has had no abnormal bleeding whatsoever.  REVIEW OF SYSTEMS: Nicholas [name] denies any recent illnesses, although he does tell me he had diarrhea during the day yesterday, continuing until last night. It has now completely resolved. He had no abdominal pain. He denies any blood or mucus in the stool. He had no associated nausea or emesis, and also denies any fevers or chills. He wonders if it was "something he ate". Nonetheless, this issue has now resolved.  Nicholas [name] continues to have some problems with chronic sinusitis, with a runny nose and sinus congestion which is stable. He has a little cough in the morning, but otherwise no significant phlegm production, no hemoptysis, no pleurisy, no increased shortness of breath, no chest pain, no palpitations. He's had no abnormal headaches and denies any dizziness or change in vision. He has some joint pain attributable to arthritis, but denies any increased myalgias, arthralgias, or bony pain otherwise.  A detailed review of systems is otherwise stable and noncontributory.    PAST MEDICAL HISTORY: Past Medical History  Diagnosis Date  . Anxiety   . Dysthymia    . Hypertension   . Allergy   . Chronic sinusitis   . H/O echocardiogram 01/04/2007    LV function normal, 55-60% EF, mildly increased thickenss of LV wall  . Abnormal CT scan, sinus 10/04/06    pansinusitis, mildly deviated septum  . History of MRI of brain and brain stem 02/26/06    sinusitis, nonspecific deep white matter changes normal for age  . Essential thrombocytosis     sees hematology; Dr. Jana Hakim    PAST SURGICAL HISTORY: Past Surgical History  Procedure Laterality Date  . Hernia repair      right inguinal repair  . Varicose vein surgery      right lower leg  . Tonsillectomy      childhood    FAMILY HISTORY Family History  Problem Relation Age of Onset  . Heart disease Father   . Stroke Father   . Cancer Neg Hx   . COPD Neg Hx   . Diabetes Neg Hx     SOCIAL HISTORY:  (Reviewed 11/28/2013) The patient lives with his wife, who suffers from diabetes and emphysema.   ADVANCED DIRECTIVES:  HEALTH MAINTENANCE: (Updated 11/28/2013) History  Substance Use Topics  . Smoking status: Former Smoker    Quit date: 06/14/1958  . Smokeless tobacco: Never Used  . Alcohol Use: No     Colonoscopy:  Not on file  PSA: Not on file  Bone density: Never  Lipid panel: October 2014  Allergies  Allergen Reactions  . Erythromycin     Current Outpatient Prescriptions  Medication Sig Dispense Refill  . acetaminophen (TYLENOL)  500 MG tablet Take 500 mg by mouth every 6 (six) hours as needed.      [name] anagrelide (AGRYLIN) 0.5 MG capsule Take 1 capsule (0.5 mg total) by mouth daily.  90 capsule  3  . aspirin 81 MG tablet Take 81 mg by mouth daily.        [name] FLUoxetine (PROZAC) 40 MG capsule Take 1 capsule (40 mg total) by mouth daily.  90 capsule  0  . sodium chloride (OCEAN) 0.65 % nasal spray Place 1 spray into the nose as needed.      . terazosin (HYTRIN) 2 MG capsule Take 1 capsule (2 mg total) by mouth at bedtime.  90 capsule  5  . terbinafine (LAMISIL AT) 1 % cream Apply  topically 2 (two) times daily.  30 g  0  . X-VIATE 40 % CREA       . ibuprofen (ADVIL,MOTRIN) 200 MG tablet Take 200 mg by mouth every 6 (six) hours as needed.       No current facility-administered medications for this visit.   Facility-Administered Medications Ordered in Other Visits  Medication Dose Route Frequency Provider Last Rate Last Dose  . influenza  inactive virus vaccine (FLUZONE/FLUARIX) injection 0.5 mL  0.5 mL Intramuscular Once Denita Lung, MD        OBJECTIVE: Elderly white male who appears comfortable and is in no acute distress Filed Vitals:   11/28/13 1440  BP: 146/58  Pulse: 86  Temp: 98.1 F (36.7 C)  Resp: 18     Body mass index is 25.2 kg/(m^2).    ECOG FS: 1 Filed Weights   11/28/13 1440  Weight: 191 lb (86.637 kg)   Physical Exam: HEENT:  Sclerae anicteric.  Oropharynx clear and moist. Buccal mucosa is slightly pale. No ulcerations or evidence of candidiasis. Neck is supple, trachea midline.  NODES:  No cervical or supraclavicular lymphadenopathy palpated.  LUNGS:  Clear to auscultation bilaterally with good excursion.  No crackles, wheezes or rhonchi HEART:  Regular rate and rhythm. No murmur appreciated. ABDOMEN:  Soft, nondistended, nontender.  Positive, slightly hyperactive bowel sounds. No organomegaly or masses palpated. No guarding or rebound tenderness. MSK:  No focal spinal tenderness to palpation. Good range of motion in both the upper and lower extremities. EXTREMITIES:  No peripheral edema.  No clubbing or cyanosis. SKIN:  Scattered ecchymoses on the forearms and hands. No petechiae. No visible rashes or skin breakdown.  NEURO:  Nonfocal. Well oriented.  Appropriate affect.    LAB RESULTS: Lab Results  Component Value Date   WBC 15.8* 11/28/2013   NEUTROABS 10.9* 11/28/2013   HGB 9.5* 11/28/2013   HCT 29.1* 11/28/2013   MCV 98.3* 11/28/2013   PLT 426* 11/28/2013      Chemistry      Component Value Date/Time   NA 138 04/04/2013 0859    K 4.4 04/04/2013 0859   CL 105 04/04/2013 0859   CO2 27 04/04/2013 0859   BUN 23 05/08/2013 0853   CREATININE 1.48* 05/08/2013 0853      Component Value Date/Time   CALCIUM 9.1 04/04/2013 0859   ALKPHOS 59 04/04/2013 0859   AST 19 04/04/2013 0859   ALT 13 04/04/2013 0859   BILITOT 0.5 04/04/2013 0859       STUDIES: No results found.   ASSESSMENT: 80 y.o.  [place] man with   (1)  a history of essential thrombocytosis initially diagnosed December 2003,   (2)  controlled on anagrelide 0.5 mg  daily and aspirin 81 mg daily.   PLAN: Dr. Jana Hakim has also reviewed Nicholas [name] labs today, and overall he appears to be quite stable. We are making no changes to his current regimen. She will continue on the low dose aspirin, 81 mg daily. We are refilling his anagrelide for another year, 0.5 mg daily.   We will continue to check his labs on a every 3 month basis, and he will return for followup here in one year. In the meanwhile, he knows to call with any changes or problems, especially should he began to have any abnormal bleeding. Nicholas [name] does voice understanding and agreement with the above plan.    BERRY,AMY PA-C    11/28/2013

## 2013-12-05 ENCOUNTER — Other Ambulatory Visit: Payer: Medicare Other

## 2013-12-05 ENCOUNTER — Ambulatory Visit: Payer: Medicare Other | Admitting: Physician Assistant

## 2014-01-22 ENCOUNTER — Ambulatory Visit (INDEPENDENT_AMBULATORY_CARE_PROVIDER_SITE_OTHER): Payer: Medicare Other | Admitting: Medical

## 2014-01-22 ENCOUNTER — Encounter: Payer: Self-pay | Admitting: Medical

## 2014-01-22 VITALS — BP 120/58 | HR 88 | Temp 98.5°F | Resp 14 | Wt 185.0 lb

## 2014-01-22 DIAGNOSIS — R21 Rash and other nonspecific skin eruption: Secondary | ICD-10-CM | POA: Diagnosis not present

## 2014-01-22 DIAGNOSIS — T148 Other injury of unspecified body region: Secondary | ICD-10-CM

## 2014-01-22 DIAGNOSIS — W57XXXA Bitten or stung by nonvenomous insect and other nonvenomous arthropods, initial encounter: Secondary | ICD-10-CM

## 2014-01-22 MED ORDER — METHYLPREDNISOLONE ACETATE PF 40 MG/ML IJ SUSP
40.0000 mg | Freq: Once | INTRAMUSCULAR | Status: AC
  Administered 2014-01-22: 40 mg via INTRAMUSCULAR

## 2014-01-22 MED ORDER — HYDROXYZINE HCL 10 MG PO TABS: 10.0000 mg | ORAL_TABLET | Freq: Three times a day (TID) | ORAL | Status: DC | PRN

## 2014-01-22 MED ORDER — TRIAMCINOLONE ACETONIDE 0.1 % EX CREA: 1.0000 "application " | TOPICAL_CREAM | Freq: Two times a day (BID) | CUTANEOUS | Status: DC

## 2014-01-22 NOTE — Progress Notes (Signed)
Subjective:   Nicholas [name] is a 80 y.o. male who presents for evaluation of a rash involving the leg, trunk and buttock. Rash started 2 days ago. Lesions are pink/red, and raised in texture. Rash has changed over time. Rash is pruritic. Associated symptoms: none. Patient denies: abdominal pain, arthralgia, fever, headache, irritability, myalgia and nausea. Patient has not had contacts with similar rash. Patient has had new exposures - was working in his outbuilding, cleaning out things.  Lots of bugs and other creepy crawly tings in there.  Using Desitin cream topically.  No other aggravating or relieving factors. No other complaint.  The following portions of the patient's history were reviewed and updated as appropriate: allergies, current medications, past family history, past medical history, past social history and problem list.  Review of Systems As in subjective above   Objective:   Gen: wd, wn, nad Skin: several scattered small 2-3 mm raised erythematous lesions scattered about both legs, buttocks. No induration, fluctuance, and   Assessment:    Encounter Diagnoses  Name Primary?  . Rash Yes  . Insect bite     Plan:   Discussed symptoms and exam findings.  IM 40mg  Depo Medrol given in office.   Prescribed: Hydroxyzine oral, 1/2-1 tablet up to TID.  Advised caution due to sedation/drowsiness . Triamcinolone cream topically.   Advised to call or return if not much improved in the next 2-3 days  Follow up when necessary

## 2014-02-28 ENCOUNTER — Other Ambulatory Visit (HOSPITAL_BASED_OUTPATIENT_CLINIC_OR_DEPARTMENT_OTHER): Payer: Medicare Other

## 2014-02-28 DIAGNOSIS — D649 Anemia, unspecified: Secondary | ICD-10-CM

## 2014-02-28 DIAGNOSIS — D473 Essential (hemorrhagic) thrombocythemia: Secondary | ICD-10-CM

## 2014-02-28 LAB — CBC WITH DIFFERENTIAL/PLATELET
BASO%: 3.1 % — AB (ref 0.0–2.0)
Basophils Absolute: 0.5 10*3/uL — ABNORMAL HIGH (ref 0.0–0.1)
EOS%: 1.2 % (ref 0.0–7.0)
Eosinophils Absolute: 0.2 10*3/uL (ref 0.0–0.5)
HEMATOCRIT: 29.5 % — AB (ref 38.4–49.9)
HEMOGLOBIN: 9.3 g/dL — AB (ref 13.0–17.1)
LYMPH%: 16.4 % (ref 14.0–49.0)
MCH: 30.5 pg (ref 27.2–33.4)
MCHC: 31.5 g/dL — ABNORMAL LOW (ref 32.0–36.0)
MCV: 96.7 fL (ref 79.3–98.0)
MONO#: 1.9 10*3/uL — AB (ref 0.1–0.9)
MONO%: 12.2 % (ref 0.0–14.0)
NEUT#: 10.4 10*3/uL — ABNORMAL HIGH (ref 1.5–6.5)
NEUT%: 67.1 % (ref 39.0–75.0)
Platelets: 418 10*3/uL — ABNORMAL HIGH (ref 140–400)
RBC: 3.05 10*6/uL — ABNORMAL LOW (ref 4.20–5.82)
RDW: 19 % — AB (ref 11.0–14.6)
WBC: 15.5 10*3/uL — AB (ref 4.0–10.3)
lymph#: 2.6 10*3/uL (ref 0.9–3.3)
nRBC: 1 % — ABNORMAL HIGH (ref 0–0)

## 2014-02-28 LAB — TECHNOLOGIST REVIEW

## 2014-03-11 ENCOUNTER — Other Ambulatory Visit: Payer: Self-pay | Admitting: Medical

## 2014-03-11 ENCOUNTER — Telehealth: Payer: Self-pay | Admitting: Medical

## 2014-03-11 ENCOUNTER — Encounter: Payer: Self-pay | Admitting: Medical

## 2014-03-11 MED ORDER — FLUOXETINE HCL 40 MG PO CAPS: 40.0000 mg | ORAL_CAPSULE | Freq: Every day | ORAL | Status: DC

## 2014-03-11 NOTE — Telephone Encounter (Signed)
Pt needs refill on prozac sent to [facility]  [address]

## 2014-03-11 NOTE — Telephone Encounter (Signed)
done

## 2014-04-08 ENCOUNTER — Other Ambulatory Visit (INDEPENDENT_AMBULATORY_CARE_PROVIDER_SITE_OTHER): Payer: Medicare Other

## 2014-04-08 DIAGNOSIS — Z23 Encounter for immunization: Secondary | ICD-10-CM

## 2014-05-29 ENCOUNTER — Other Ambulatory Visit: Payer: Self-pay | Admitting: *Deleted

## 2014-05-29 DIAGNOSIS — D75839 Thrombocytosis, unspecified: Secondary | ICD-10-CM

## 2014-05-29 DIAGNOSIS — D473 Essential (hemorrhagic) thrombocythemia: Secondary | ICD-10-CM

## 2014-05-30 ENCOUNTER — Other Ambulatory Visit (HOSPITAL_BASED_OUTPATIENT_CLINIC_OR_DEPARTMENT_OTHER): Payer: Medicare Other

## 2014-05-30 DIAGNOSIS — D473 Essential (hemorrhagic) thrombocythemia: Secondary | ICD-10-CM | POA: Diagnosis not present

## 2014-05-30 DIAGNOSIS — D75839 Thrombocytosis, unspecified: Secondary | ICD-10-CM

## 2014-05-30 LAB — CBC WITH DIFFERENTIAL/PLATELET
BASO%: 3.4 % — ABNORMAL HIGH (ref 0.0–2.0)
Basophils Absolute: 0.5 10e3/uL — ABNORMAL HIGH (ref 0.0–0.1)
EOS%: 1.2 % (ref 0.0–7.0)
Eosinophils Absolute: 0.2 10e3/uL (ref 0.0–0.5)
HCT: 30.9 % — ABNORMAL LOW (ref 38.4–49.9)
HGB: 9.6 g/dL — ABNORMAL LOW (ref 13.0–17.1)
LYMPH%: 17.4 % (ref 14.0–49.0)
MCH: 30.9 pg (ref 27.2–33.4)
MCHC: 31.1 g/dL — ABNORMAL LOW (ref 32.0–36.0)
MCV: 99.4 fL — ABNORMAL HIGH (ref 79.3–98.0)
MONO#: 1.8 10e3/uL — ABNORMAL HIGH (ref 0.1–0.9)
MONO%: 13.2 % (ref 0.0–14.0)
NEUT#: 9 10e3/uL — ABNORMAL HIGH (ref 1.5–6.5)
NEUT%: 64.8 % (ref 39.0–75.0)
Platelets: 378 10e3/uL (ref 140–400)
RBC: 3.11 10e6/uL — ABNORMAL LOW (ref 4.20–5.82)
RDW: 20.1 % — ABNORMAL HIGH (ref 11.0–14.6)
WBC: 13.9 10e3/uL — ABNORMAL HIGH (ref 4.0–10.3)
lymph#: 2.4 10e3/uL (ref 0.9–3.3)
nRBC: 1 % — ABNORMAL HIGH (ref 0–0)

## 2014-05-30 LAB — COMPREHENSIVE METABOLIC PANEL (CC13)
ALBUMIN: 3.5 g/dL (ref 3.5–5.0)
ALT: 16 U/L (ref 0–55)
AST: 20 U/L (ref 5–34)
Alkaline Phosphatase: 76 U/L (ref 40–150)
Anion Gap: 10 mEq/L (ref 3–11)
BUN: 19.5 mg/dL (ref 7.0–26.0)
CHLORIDE: 110 meq/L — AB (ref 98–109)
CO2: 24 meq/L (ref 22–29)
Calcium: 8.7 mg/dL (ref 8.4–10.4)
Creatinine: 1.6 mg/dL — ABNORMAL HIGH (ref 0.7–1.3)
EGFR: 40 mL/min/{1.73_m2} — ABNORMAL LOW (ref >90)
Glucose: 99 mg/dl (ref 70–140)
POTASSIUM: 4.1 meq/L (ref 3.5–5.1)
SODIUM: 143 meq/L (ref 136–145)
Total Bilirubin: 0.5 mg/dL (ref 0.20–1.20)
Total Protein: 6.8 g/dL (ref 6.4–8.3)

## 2014-05-30 LAB — TECHNOLOGIST REVIEW

## 2014-06-10 ENCOUNTER — Telehealth: Payer: Self-pay | Admitting: Medical

## 2014-06-10 ENCOUNTER — Other Ambulatory Visit: Payer: Self-pay | Admitting: Medical

## 2014-06-10 MED ORDER — FLUOXETINE HCL 40 MG PO CAPS: 40.0000 mg | ORAL_CAPSULE | Freq: Every day | ORAL | Status: DC

## 2014-06-10 MED ORDER — TERAZOSIN HCL 2 MG PO CAPS: 2.0000 mg | ORAL_CAPSULE | Freq: Every day | ORAL | Status: DC

## 2014-06-10 NOTE — Telephone Encounter (Signed)
i sent the mediations, make f/u visit in January

## 2014-06-10 NOTE — Telephone Encounter (Signed)
Pt needs refills on Hytrin and Prozac. He is completely out of the Hytrin. Pt uses [facility] [address]

## 2014-06-10 NOTE — Telephone Encounter (Signed)
Pt scheduled an appt for 06/26/2014.

## 2014-06-26 ENCOUNTER — Ambulatory Visit (INDEPENDENT_AMBULATORY_CARE_PROVIDER_SITE_OTHER): Payer: Medicare Other | Admitting: Medical

## 2014-06-26 ENCOUNTER — Encounter: Payer: Self-pay | Admitting: Medical

## 2014-06-26 VITALS — BP 120/70 | HR 73 | Temp 98.3°F | Resp 14 | Wt 185.0 lb

## 2014-06-26 DIAGNOSIS — N289 Disorder of kidney and ureter, unspecified: Secondary | ICD-10-CM | POA: Diagnosis not present

## 2014-06-26 DIAGNOSIS — I10 Essential (primary) hypertension: Secondary | ICD-10-CM

## 2014-06-26 DIAGNOSIS — F341 Dysthymic disorder: Secondary | ICD-10-CM

## 2014-06-26 DIAGNOSIS — D473 Essential (hemorrhagic) thrombocythemia: Secondary | ICD-10-CM

## 2014-06-26 DIAGNOSIS — M25562 Pain in left knee: Secondary | ICD-10-CM | POA: Diagnosis not present

## 2014-06-26 DIAGNOSIS — Z23 Encounter for immunization: Secondary | ICD-10-CM

## 2014-06-26 DIAGNOSIS — M25561 Pain in right knee: Secondary | ICD-10-CM | POA: Diagnosis not present

## 2014-06-26 LAB — RENAL FUNCTION PANEL
ALBUMIN: 3.9 g/dL (ref 3.5–5.2)
BUN: 21 mg/dL (ref 6–23)
CALCIUM: 8.9 mg/dL (ref 8.4–10.5)
CO2: 26 mEq/L (ref 19–32)
Chloride: 107 mEq/L (ref 96–112)
Creat: 1.46 mg/dL — ABNORMAL HIGH (ref 0.50–1.35)
Glucose, Bld: 93 mg/dL (ref 70–99)
Phosphorus: 2.8 mg/dL (ref 2.3–4.6)
Potassium: 4.3 mEq/L (ref 3.5–5.3)
SODIUM: 140 meq/L (ref 135–145)

## 2014-06-26 LAB — TSH: TSH: 3.904 u[IU]/mL (ref 0.350–4.500)

## 2014-06-26 LAB — T4, FREE: Free T4: 0.8 ng/dL (ref 0.80–1.80)

## 2014-06-26 MED ORDER — HYDROCODONE-ACETAMINOPHEN 2.5-325 MG PO TABS: 1.0000 | ORAL_TABLET | Freq: Three times a day (TID) | ORAL | Status: DC | PRN

## 2014-06-26 MED ORDER — FLUOXETINE HCL 10 MG PO TABS: 10.0000 mg | ORAL_TABLET | Freq: Every day | ORAL | Status: DC

## 2014-06-26 MED ORDER — TERAZOSIN HCL 2 MG PO CAPS: 2.0000 mg | ORAL_CAPSULE | Freq: Every day | ORAL | Status: DC

## 2014-06-26 MED ORDER — FLUOXETINE HCL 40 MG PO CAPS: 40.0000 mg | ORAL_CAPSULE | Freq: Every day | ORAL | Status: DC

## 2014-06-26 NOTE — Patient Instructions (Signed)
  Thank you for giving me the opportunity to serve you today.    Your diagnosis today includes: Encounter Diagnoses  Name Primary?  . Essential hypertension Yes  . Dysthymia   . Renal insufficiency   . Need for prophylactic vaccination against Streptococcus pneumoniae (pneumococcus)   . Knee pain, bilateral   . Essential thrombocytosis      Specific recommendations today include:  Continue Prozac 40 mg daily but add a 10 mg tablet daily to the medication. Thus you will now be taking 50 mg daily of Prozac  For your knees you can either use over-the-counter regular dose Tylenol, Tylenol arthritis or the hydrocodone I gave you today depending upon the pain level.    for mild pain just use plain Tylenol.  For moderate pain use Tylenol arthritis. For worse pain use the hydrocodone which contains Tylenol as well.  Don't take the hydrocodone and over-the-counter Tylenol at the same time, space them out by at least 4 hours  Avoid over-the-counter ibuprofen, Motrin, Aleve due to potential damage to the kidney  We will call with lab results  We updated your pneumococcal 13 vaccine today    Return pending labs, 77mo.

## 2014-06-26 NOTE — Progress Notes (Signed)
Subjective: Here for routine f/u at my request.  Hypertension - compliant with Hytrin without complaint.   No chest pain, no swelling, no palpations, no SOB, syncope.  Dysthymia - compliant with Prozac 40mg .  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   Has no nurse aid that comes in.   Doesn't feel the need to changes his medications.  Doing ok at this time.    Sees hematology regularly for blood disorder, essential thrombocytosis and anemia, sees Dr. Jana Hakim and recent OV notes and labs reviewed.   He has been stable on current medication, Aspirin and Agrylin.  Gets up once nightly to urinate.   No other urinary c/o or prostate c/o, no hesitancy, no dribbling or weak stream.    Still has problems with knees.  Uses Tylenol OTC that seems to work ok.   Denies any problems with hearing, vision, memory, or mood otherwise.   Past Medical History  Diagnosis Date  . Anxiety   . Dysthymia   . Hypertension   . Allergy   . Chronic sinusitis   . H/O echocardiogram 01/04/2007    LV function normal, 55-60% EF, mildly increased thickenss of LV wall  . Abnormal CT scan, sinus 10/04/06    pansinusitis, mildly deviated septum  . History of MRI of brain and brain stem 02/26/06    sinusitis, nonspecific deep white matter changes normal for age  . Essential thrombocytosis     sees hematology; Dr. Jana Hakim   No other c/o.  ROS as in subjective otherwise   Objective: BP 120/70 mmHg  Pulse 73  Temp(Src) 98.3 F (36.8 C) (Oral)  Resp 14  Wt 185 lb (83.915 kg)  BP Readings from Last 3 Encounters:  06/26/14 120/70  01/22/14 120/58  11/28/13 146/58   Wt Readings from Last 3 Encounters:  06/26/14 185 lb (83.915 kg)  01/22/14 185 lb (83.915 kg)  11/28/13 191 lb (86.637 kg)   General appearance: alert, no distress, WD/WN Oral cavity: MMM, no lesions Neck: supple, no lymphadenopathy, no thyromegaly, no masses Heart: RRR, normal S1, S2, no murmurs Lungs: CTA  bilaterally, no wheezes, rhonchi, or rales Abdomen: +bs, soft, non tender, non distended, no masses, no hepatomegaly, no splenomegaly MSK: bony arthritis changes of knees, no laxity, no swelling, nontender in general, otherwise legs unremarkable, nontender Pulses: 2+ symmetric, upper and lower extremities, normal cap refill Ext: no edema Psych: pleasant, good eye contact, seems to be in good spirits Neuro: A&O x 3, CN2-12 intact    Assessment: Encounter Diagnoses  Name Primary?  . Essential hypertension Yes  . Dysthymia   . Renal insufficiency   . Need for prophylactic vaccination against Streptococcus pneumoniae (pneumococcus)   . Knee pain, bilateral   . Essential thrombocytosis     Plan: HTN - doing fine on current medication.  Dysthymia - c/t Prozac 40mg , but add 10mg  to make 50mg  total daily.  We talked about his concerns, wife's recent medical issues ,stroke, and counseling on coping.   Consider counseling.    Renal insufficiency - labs today  Counseled on the pneumococcal vaccine.  Vaccine information sheet given.  Pneumococcal/PCV13 vaccine given after consent obtained.  Counseled on shingles vaccine but he declines due to cost.  Knee pain - does ok on Tylenol.  He declines any other eval at this time. Likely OA which is not unreasonable for this age.   Can use either regularly Tylenol, extra strength tylenol or low dose Hydrocodone given  today for prn use.     Thrombocytosis - stable on current medication, reviewed recent hematology notes and labs  Discussed the above and recommendations with his daughter Callie Quiros who will go back over the recommendations at home with him.  AV summary printed.

## 2014-07-03 ENCOUNTER — Telehealth: Payer: Self-pay | Admitting: *Deleted

## 2014-07-03 ENCOUNTER — Other Ambulatory Visit: Payer: Self-pay | Admitting: *Deleted

## 2014-07-03 NOTE — Telephone Encounter (Signed)
This RN spoke with [facility] per fax stating anagrelide is presently not available per their 2 suppliers.  This RN called pt and he states he has 3 tablets left. Informed him of situation and need to find a pharmacy that has medication.   This RN contacted several pharmacies with medication not in stock and again not available.  Per [facility]- they can order agrylin 0.5mg  for pick up tomorrow.  Prescription given.  This RN contacted pt and informed him availability at [facility]- pt states he knows location- he understands he does not need a membership to obtain prescriptions.  Pt also understands if weather is too severe to travel he is to wait until safe even if he has to go without medication for a day or two.  Nicholas [name] verbalized understanding.

## 2014-07-03 NOTE — Telephone Encounter (Signed)
No entry 

## 2014-07-04 ENCOUNTER — Telehealth: Payer: Self-pay | Admitting: *Deleted

## 2014-07-04 NOTE — Telephone Encounter (Signed)
Patient called reporting [facility] informed him they cannot get the anagrelide.  Asked where he can get this medicine or what does provider want to change this to.  Has 5 pills to last through 07-08-2014.  Learned a LIMITED RELEASE of anagrelide is expected 07-08-2014 and it is unknown what wholesalers will receive it.  [facility] has nine pills on hand. Ordered these for patient and will call him 07-18-2014 with Dr. Virgie Dad orders to proceed with treatment.

## 2014-07-10 DIAGNOSIS — Z87898 Personal history of other specified conditions: Secondary | ICD-10-CM | POA: Diagnosis not present

## 2014-07-10 DIAGNOSIS — L821 Other seborrheic keratosis: Secondary | ICD-10-CM | POA: Diagnosis not present

## 2014-07-10 DIAGNOSIS — Z85828 Personal history of other malignant neoplasm of skin: Secondary | ICD-10-CM | POA: Diagnosis not present

## 2014-07-10 DIAGNOSIS — L570 Actinic keratosis: Secondary | ICD-10-CM | POA: Diagnosis not present

## 2014-07-11 ENCOUNTER — Telehealth: Payer: Self-pay | Admitting: *Deleted

## 2014-07-11 DIAGNOSIS — D473 Essential (hemorrhagic) thrombocythemia: Secondary | ICD-10-CM

## 2014-07-11 DIAGNOSIS — D75839 Thrombocytosis, unspecified: Secondary | ICD-10-CM

## 2014-07-11 MED ORDER — ANAGRELIDE HCL 0.5 MG PO CAPS: 0.5000 mg | ORAL_CAPSULE | Freq: Every day | ORAL | Status: DC

## 2014-07-11 NOTE — Telephone Encounter (Signed)
Called [facility] to inquire about anagrelide which is on Limited availability.  None in stock but it appears they can order from supplier.  Order given for refill but will not notify patient until sure this can be received on tomorrow.  An order for 9 pills was supplied by [facility] last week.

## 2014-07-18 ENCOUNTER — Telehealth: Payer: Self-pay | Admitting: *Deleted

## 2014-07-18 NOTE — Telephone Encounter (Signed)
Patient called.  He is wondering what Dr. Jana Hakim wants him to do since he is unable to get the anagrelide.  Let him know that I would check with [facility] today and make sure they are still unable to get this medication.  If so, then I will ask Dr. Jana Hakim what to do.   Nicholas [name].  They actually have been able to obtain this and have a rx filled for him.   Called patient back and left message on answering machine that medication is ready for him to pick up at [facility].

## 2014-08-28 ENCOUNTER — Other Ambulatory Visit: Payer: Self-pay | Admitting: *Deleted

## 2014-08-28 DIAGNOSIS — D75839 Thrombocytosis, unspecified: Secondary | ICD-10-CM

## 2014-08-28 DIAGNOSIS — D473 Essential (hemorrhagic) thrombocythemia: Secondary | ICD-10-CM

## 2014-08-29 ENCOUNTER — Other Ambulatory Visit (HOSPITAL_BASED_OUTPATIENT_CLINIC_OR_DEPARTMENT_OTHER): Payer: Medicare Other

## 2014-08-29 DIAGNOSIS — D473 Essential (hemorrhagic) thrombocythemia: Secondary | ICD-10-CM

## 2014-08-29 DIAGNOSIS — D75839 Thrombocytosis, unspecified: Secondary | ICD-10-CM

## 2014-08-29 LAB — CBC WITH DIFFERENTIAL/PLATELET
BASO%: 3.1 % — ABNORMAL HIGH (ref 0.0–2.0)
BASOS ABS: 0.5 10*3/uL — AB (ref 0.0–0.1)
EOS%: 1.1 % (ref 0.0–7.0)
Eosinophils Absolute: 0.2 10*3/uL (ref 0.0–0.5)
HCT: 30.2 % — ABNORMAL LOW (ref 38.4–49.9)
HEMOGLOBIN: 9.6 g/dL — AB (ref 13.0–17.1)
LYMPH%: 19.1 % (ref 14.0–49.0)
MCH: 31.5 pg (ref 27.2–33.4)
MCHC: 31.8 g/dL — ABNORMAL LOW (ref 32.0–36.0)
MCV: 99 fL — ABNORMAL HIGH (ref 79.3–98.0)
MONO#: 1.9 10*3/uL — ABNORMAL HIGH (ref 0.1–0.9)
MONO%: 12.3 % (ref 0.0–14.0)
NEUT%: 64.4 % (ref 39.0–75.0)
NEUTROS ABS: 10.1 10*3/uL — AB (ref 1.5–6.5)
Platelets: 351 10*3/uL (ref 140–400)
RBC: 3.05 10*6/uL — AB (ref 4.20–5.82)
RDW: 19.4 % — AB (ref 11.0–14.6)
WBC: 15.7 10*3/uL — ABNORMAL HIGH (ref 4.0–10.3)
lymph#: 3 10*3/uL (ref 0.9–3.3)

## 2014-08-29 LAB — TECHNOLOGIST REVIEW

## 2014-09-09 ENCOUNTER — Telehealth: Payer: Self-pay | Admitting: Medical

## 2014-09-09 NOTE — Telephone Encounter (Signed)
Pt needs refills on prozac and hytrin sent to [facility] [address]

## 2014-09-10 ENCOUNTER — Other Ambulatory Visit: Payer: Self-pay | Admitting: Family Medicine

## 2014-09-10 MED ORDER — TERAZOSIN HCL 2 MG PO CAPS: 2.0000 mg | ORAL_CAPSULE | Freq: Every day | ORAL | Status: DC

## 2014-09-10 MED ORDER — FLUOXETINE HCL 40 MG PO CAPS: 40.0000 mg | ORAL_CAPSULE | Freq: Every day | ORAL | Status: DC

## 2014-09-10 MED ORDER — FLUOXETINE HCL 10 MG PO TABS: 10.0000 mg | ORAL_TABLET | Freq: Every day | ORAL | Status: DC

## 2014-09-10 NOTE — Telephone Encounter (Signed)
Rx refills on the patients 2 medications was sent to his pharmacy's as requested

## 2014-11-03 ENCOUNTER — Other Ambulatory Visit: Payer: Self-pay | Admitting: Oncology

## 2014-11-03 DIAGNOSIS — D469 Myelodysplastic syndrome, unspecified: Secondary | ICD-10-CM

## 2014-11-04 ENCOUNTER — Telehealth: Payer: Self-pay | Admitting: Oncology

## 2014-11-04 NOTE — Telephone Encounter (Signed)
S.w. Pt and advised on 6.14 and 6.16 appt....the patient ok and aware

## 2014-11-26 ENCOUNTER — Other Ambulatory Visit (HOSPITAL_BASED_OUTPATIENT_CLINIC_OR_DEPARTMENT_OTHER): Payer: Medicare Other

## 2014-11-26 DIAGNOSIS — D473 Essential (hemorrhagic) thrombocythemia: Secondary | ICD-10-CM

## 2014-11-26 DIAGNOSIS — D469 Myelodysplastic syndrome, unspecified: Secondary | ICD-10-CM

## 2014-11-26 LAB — CBC & DIFF AND RETIC
BASO%: 3.3 % — ABNORMAL HIGH (ref 0.0–2.0)
Basophils Absolute: 0.5 10*3/uL — ABNORMAL HIGH (ref 0.0–0.1)
EOS%: 1.7 % (ref 0.0–7.0)
Eosinophils Absolute: 0.3 10*3/uL (ref 0.0–0.5)
HCT: 29.6 % — ABNORMAL LOW (ref 38.4–49.9)
HGB: 9.3 g/dL — ABNORMAL LOW (ref 13.0–17.1)
Immature Retic Fract: 21.5 % — ABNORMAL HIGH (ref 3.00–10.60)
LYMPH%: 16.5 % (ref 14.0–49.0)
MCH: 30.8 pg (ref 27.2–33.4)
MCHC: 31.4 g/dL — AB (ref 32.0–36.0)
MCV: 98 fL (ref 79.3–98.0)
MONO#: 2 10*3/uL — ABNORMAL HIGH (ref 0.1–0.9)
MONO%: 12.8 % (ref 0.0–14.0)
NEUT#: 10.3 10*3/uL — ABNORMAL HIGH (ref 1.5–6.5)
NEUT%: 65.7 % (ref 39.0–75.0)
NRBC: 2 % — AB (ref 0–0)
Platelets: 386 10*3/uL (ref 140–400)
RBC: 3.02 10*6/uL — ABNORMAL LOW (ref 4.20–5.82)
RDW: 19.4 % — AB (ref 11.0–14.6)
RETIC %: 3.37 % — AB (ref 0.80–1.80)
Retic Ct Abs: 101.77 10*3/uL — ABNORMAL HIGH (ref 34.80–93.90)
WBC: 15.7 10*3/uL — AB (ref 4.0–10.3)
lymph#: 2.6 10*3/uL (ref 0.9–3.3)

## 2014-11-26 LAB — LACTATE DEHYDROGENASE (CC13): LDH: 425 U/L — AB (ref 125–245)

## 2014-11-26 LAB — COMPREHENSIVE METABOLIC PANEL (CC13)
ALK PHOS: 73 U/L (ref 40–150)
ALT: 13 U/L (ref 0–55)
AST: 20 U/L (ref 5–34)
Albumin: 3.6 g/dL (ref 3.5–5.0)
Anion Gap: 12 mEq/L — ABNORMAL HIGH (ref 3–11)
BILIRUBIN TOTAL: 0.58 mg/dL (ref 0.20–1.20)
BUN: 25 mg/dL (ref 7.0–26.0)
CO2: 22 mEq/L (ref 22–29)
CREATININE: 1.6 mg/dL — AB (ref 0.7–1.3)
Calcium: 8.7 mg/dL (ref 8.4–10.4)
Chloride: 108 mEq/L (ref 98–109)
EGFR: 38 mL/min/{1.73_m2} — AB (ref >90)
Glucose: 95 mg/dl (ref 70–140)
Potassium: 4.2 mEq/L (ref 3.5–5.1)
SODIUM: 142 meq/L (ref 136–145)
Total Protein: 6.8 g/dL (ref 6.4–8.3)

## 2014-11-26 LAB — TECHNOLOGIST REVIEW

## 2014-11-26 LAB — CHCC SMEAR

## 2014-11-28 ENCOUNTER — Ambulatory Visit (HOSPITAL_BASED_OUTPATIENT_CLINIC_OR_DEPARTMENT_OTHER): Payer: Medicare Other | Admitting: Oncology

## 2014-11-28 ENCOUNTER — Telehealth: Payer: Self-pay | Admitting: Oncology

## 2014-11-28 ENCOUNTER — Other Ambulatory Visit: Payer: Medicare Other

## 2014-11-28 VITALS — BP 137/57 | HR 85 | Temp 99.0°F | Resp 18 | Ht 73.0 in | Wt 187.4 lb

## 2014-11-28 DIAGNOSIS — D649 Anemia, unspecified: Secondary | ICD-10-CM | POA: Diagnosis not present

## 2014-11-28 DIAGNOSIS — D75839 Thrombocytosis, unspecified: Secondary | ICD-10-CM

## 2014-11-28 DIAGNOSIS — D473 Essential (hemorrhagic) thrombocythemia: Secondary | ICD-10-CM

## 2014-11-28 MED ORDER — ANAGRELIDE HCL 0.5 MG PO CAPS
0.5000 mg | ORAL_CAPSULE | Freq: Every day | ORAL | Status: DC
Start: 2014-11-28 — End: 2015-01-02

## 2014-11-28 NOTE — Telephone Encounter (Signed)
Gave avs & calendar for September, December, March. June.

## 2014-11-28 NOTE — Progress Notes (Signed)
ID: Nicholas [name]   DOB: 06-Aug-1927  MR#: [id number]  EYC#:[id number]  PCP: Wyatt Haste, MD GYN: SU:  OTHER MD:  CHIEF COMPLAINT:  Essential thrombocytosis and chronic anemia  CURRENT TREATMENT: Anagrelide  HISTORY OF PRESENT ILLNESS: Patient was initially diagnosed with essential thrombocytosis in December of 2003. He has been followed regularly with labs since that time, with his disease process controlled with anagrelide 0.5 mg and aspirin 81 mg daily.   INTERVAL HISTORY: Nicholas [name] returns today for follow-up of his essential thrombocytosis. The interval history is generally unremarkable. 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 The main reason he retired is because his wife has had a few small strokes and he wanted to be able to be there more for her. As a result of not working he is also not exercising very much. He is taking the anagrelide with no side effects that he is aware of. He obtains it at a very good price.  REVIEW OF SYSTEMS: He has problem with stress urinary incontinence and urinary dribbling. His skin is frail and he bruises easily. He's had no unusual headaches, no fevers, no drenching sweats, no itchy rash, and no adenopathy.his weight is unchanged. He denies unexplained fatigue. A detailed review of systems today was otherwise stable   PAST MEDICAL HISTORY: Past Medical History  Diagnosis Date  . Anxiety   . Dysthymia   . Hypertension   . Allergy   . Chronic sinusitis   . H/O echocardiogram 01/04/2007    LV function normal, 55-60% EF, mildly increased thickenss of LV wall  . Abnormal CT scan, sinus 10/04/06    pansinusitis, mildly deviated septum  . History of MRI of brain and brain stem 02/26/06    sinusitis, nonspecific deep white matter changes normal for age  . Essential thrombocytosis     sees hematology; Dr. Jana Hakim    PAST SURGICAL HISTORY: Past Surgical History  Procedure Laterality Date  . Hernia repair      right  inguinal repair  . Varicose vein surgery      right lower leg  . Tonsillectomy      childhood    FAMILY HISTORY Family History  Problem Relation Age of Onset  . Heart disease Father   . Stroke Father   . Cancer Neg Hx   . COPD Neg Hx   . Diabetes Neg Hx     SOCIAL HISTORY:  (Reviewed 11/28/2013) The patient lives with his wife, who suffers from diabetes and emphysema and has had some small strokes. She is able to care for herself. He gives her her insulin shots. He has 2 daughters, [name] who [place] and [place] [facility]. He has 3 grandchildren.[name]   ADVANCED DIRECTIVES:  HEALTH MAINTENANCE: (Updated 11/28/2013) History  Substance Use Topics  . Smoking status: Former Smoker    Quit date: 06/14/1958  . Smokeless tobacco: Never Used  . Alcohol Use: No     Colonoscopy:  Not on file  PSA: Not on file  Bone density: Never  Lipid panel: October 2014  Allergies  Allergen Reactions  . Erythromycin     Current Outpatient Prescriptions  Medication Sig Dispense Refill  . acetaminophen (TYLENOL) 500 MG tablet Take 500 mg by mouth every 6 (six) hours as needed.    [name] anagrelide (AGRYLIN) 0.5 MG capsule Take 1 capsule (0.5 mg total) by mouth daily. 90 capsule 1  . aspirin 81  MG tablet Take 81 mg by mouth daily.      [name] FLUoxetine (PROZAC) 10 MG tablet Take 1 tablet (10 mg total) by mouth daily. 90 tablet 0  . FLUoxetine (PROZAC) 40 MG capsule Take 1 capsule (40 mg total) by mouth daily. 90 capsule 0  . Hydrocodone-Acetaminophen 2.5-325 MG TABS Take 1 tablet by mouth every 8 (eight) hours as needed. 30 tablet 0  . sodium chloride (OCEAN) 0.65 % nasal spray Place 1 spray into the nose as needed.    . terazosin (HYTRIN) 2 MG capsule Take 1 capsule (2 mg total) by mouth at bedtime. 90 capsule 0  . X-VIATE 40 % CREA      No current facility-administered medications for this visit.   Facility-Administered Medications Ordered in Other Visits   Medication Dose Route Frequency Provider Last Rate Last Dose  . influenza  inactive virus vaccine (FLUZONE/FLUARIX) injection 0.5 mL  0.5 mL Intramuscular Once Denita Lung, MD        OBJECTIVE: Elderly white male who appears stated age 81 Vitals:   11/28/14 1410  BP: 137/57  Pulse: 85  Temp: 99 F (37.2 C)  Resp: 18     Body mass index is 24.73 kg/(m^2).    ECOG FS: 1 Filed Weights   11/28/14 1410  Weight: 187 lb 6.4 oz (85.004 kg)   Sclerae unicteric Oropharynx clear and moist-- some missing teeth No cervical or supraclavicular adenopathy Lungs no rales or rhonchi Heart regular rate and rhythm Abd soft, nontender, positive bowel sounds, no splenomegaly palpable MSK no focal spinal tenderness, no upper extremity lymphedema Neuro: nonfocal, well oriented, appropriate affect Skin: Senile purpura   LAB RESULTS: Results for JABIER, DEESE (MRN [id number]) as of 11/28/2014 14:13  Ref. Range 11/28/2013 14:21 02/28/2014 14:39 05/30/2014 15:04 08/29/2014 15:06 11/26/2014 13:59  WBC Latest Ref Range: 4.0-10.3 10e3/uL 15.8 (H) 15.5 (H) 13.9 (H) 15.7 (H) 15.7 (H)  Results for TYSHAUN, VINZANT (MRN [id number]) as of 11/28/2014 14:13  Ref. Range 11/28/2013 14:21 02/28/2014 14:39 05/30/2014 15:04 08/29/2014 15:06 11/26/2014 13:59  Platelets Latest Ref Range: 140-400 10e3/uL 426 (H) 418 (H) 378 351 386  Results for ASCHER, SCHROEPFER (MRN [id number]) as of 11/28/2014 14:13  Ref. Range 11/28/2013 14:21 02/28/2014 14:39 05/30/2014 15:04 08/29/2014 15:06 11/26/2014 13:59  Hemoglobin Latest Ref Range: 13.0-17.1 g/dL 9.5 (L) 9.3 (L) 9.6 (L) 9.6 (L) 9.3 (L)    Lab Results  Component Value Date   WBC 15.7* 11/26/2014   NEUTROABS 10.3* 11/26/2014   HGB 9.3* 11/26/2014   HCT 29.6* 11/26/2014   MCV 98.0 11/26/2014   PLT 386 11/26/2014      Chemistry      Component Value Date/Time   NA 142 11/26/2014 1359   NA 140 06/26/2014 0958   K 4.2 11/26/2014 1359   K 4.3 06/26/2014 0958   CL 107  06/26/2014 0958   CO2 22 11/26/2014 1359   CO2 26 06/26/2014 0958   BUN 25.0 11/26/2014 1359   BUN 21 06/26/2014 0958   CREATININE 1.6* 11/26/2014 1359   CREATININE 1.46* 06/26/2014 0958      Component Value Date/Time   CALCIUM 8.7 11/26/2014 1359   CALCIUM 8.9 06/26/2014 0958   ALKPHOS 73 11/26/2014 1359   ALKPHOS 59 04/04/2013 0859   AST 20 11/26/2014 1359   AST 19 04/04/2013 0859   ALT 13 11/26/2014 1359   ALT 13 04/04/2013 0859   BILITOT 0.58 11/26/2014 1359   BILITOT 0.5  04/04/2013 0859       STUDIES: Review of blood film shows mostly polys and some lymphocytes. There is an increased number of mature appearing monocytes. There are Rare large cells with round nuclei but no nucleoli. These could be blasts. There is a rare nucleated red cell. There is some tailed poikilocytes. There are no platelet clumps.  ASSESSMENT: 81 y.o.  [place] man with   (1)  a history of essential thrombocytosis initially diagnosed December 2003,  controlled on anagrelide 0.5 mg daily and aspirin 81 mg daily.   (2) anemia, likely secondary to chronic kidney disease   PLAN: Nicholas [name] numbers are very stable. If his hemoglobin drops below 9 I suspect he will become significantly symptomatic, and we would consider erythropoietin at that point. We would also check his iron levels. However before starting a growth factor I would like to show that there is no preleukemia present, given the circulating blasts we see occasionally in his blood.( these were noted 3 months ago as well). At that point we would want to do a bone marrow biopsy and flow cytometry.  At this point, however, given the overall situation, the fact that the numbers really have not changed,and his age, I think it is reasonable to continue to follow labsevery 3 months with visits once a year. He iis very comfortable with this plan. He knows to call for any problems that may develop before his next visit here.   [name],  MD     11/28/2014

## 2014-12-05 ENCOUNTER — Telehealth: Payer: Self-pay | Admitting: Medical

## 2014-12-05 ENCOUNTER — Other Ambulatory Visit: Payer: Self-pay | Admitting: Medical

## 2014-12-05 MED ORDER — TERAZOSIN HCL 2 MG PO CAPS: 2.0000 mg | ORAL_CAPSULE | Freq: Every day | ORAL | Status: DC

## 2014-12-05 MED ORDER — FLUOXETINE HCL 10 MG PO TABS: 10.0000 mg | ORAL_TABLET | Freq: Every day | ORAL | Status: DC

## 2014-12-05 MED ORDER — FLUOXETINE HCL 40 MG PO CAPS: 40.0000 mg | ORAL_CAPSULE | Freq: Every day | ORAL | Status: DC

## 2014-12-05 NOTE — Telephone Encounter (Signed)
Pt needs refills on prozac and terazosin sent to [address]

## 2015-01-02 ENCOUNTER — Other Ambulatory Visit: Payer: Self-pay | Admitting: Oncology

## 2015-02-20 ENCOUNTER — Other Ambulatory Visit (HOSPITAL_COMMUNITY)
Admission: RE | Admit: 2015-02-20 | Discharge: 2015-02-20 | Disposition: A | Discharge status: Home / Self Care | Payer: Medicare Other | Source: Ambulatory Visit | Attending: Oncology | Admitting: Oncology

## 2015-02-20 ENCOUNTER — Other Ambulatory Visit (HOSPITAL_BASED_OUTPATIENT_CLINIC_OR_DEPARTMENT_OTHER): Payer: Medicare Other

## 2015-02-20 DIAGNOSIS — D473 Essential (hemorrhagic) thrombocythemia: Secondary | ICD-10-CM

## 2015-02-20 DIAGNOSIS — D75839 Thrombocytosis, unspecified: Secondary | ICD-10-CM

## 2015-02-20 DIAGNOSIS — D759 Disease of blood and blood-forming organs, unspecified: Secondary | ICD-10-CM | POA: Diagnosis not present

## 2015-02-20 DIAGNOSIS — D649 Anemia, unspecified: Secondary | ICD-10-CM | POA: Diagnosis not present

## 2015-02-20 LAB — COMPREHENSIVE METABOLIC PANEL (CC13)
ALT: 14 U/L (ref 0–55)
AST: 18 U/L (ref 5–34)
Albumin: 3.8 g/dL (ref 3.5–5.0)
Alkaline Phosphatase: 78 U/L (ref 40–150)
Anion Gap: 10 mEq/L (ref 3–11)
BILIRUBIN TOTAL: 0.65 mg/dL (ref 0.20–1.20)
BUN: 26.2 mg/dL — ABNORMAL HIGH (ref 7.0–26.0)
CO2: 22 meq/L (ref 22–29)
Calcium: 8.8 mg/dL (ref 8.4–10.4)
Chloride: 109 mEq/L (ref 98–109)
Creatinine: 1.6 mg/dL — ABNORMAL HIGH (ref 0.7–1.3)
EGFR: 38 mL/min/{1.73_m2} — AB (ref >90)
Glucose: 98 mg/dl (ref 70–140)
Potassium: 4.2 mEq/L (ref 3.5–5.1)
Sodium: 141 mEq/L (ref 136–145)
TOTAL PROTEIN: 7.3 g/dL (ref 6.4–8.3)

## 2015-02-20 LAB — LACTATE DEHYDROGENASE (CC13): LDH: 437 U/L — AB (ref 125–245)

## 2015-02-20 LAB — CBC WITH DIFFERENTIAL/PLATELET
BASO%: 2.1 % — ABNORMAL HIGH (ref 0.0–2.0)
BASOS ABS: 0.3 10*3/uL — AB (ref 0.0–0.1)
EOS%: 1.3 % (ref 0.0–7.0)
Eosinophils Absolute: 0.2 10*3/uL (ref 0.0–0.5)
HCT: 30.3 % — ABNORMAL LOW (ref 38.4–49.9)
HGB: 9.9 g/dL — ABNORMAL LOW (ref 13.0–17.1)
LYMPH%: 17.7 % (ref 14.0–49.0)
MCH: 31.8 pg (ref 27.2–33.4)
MCHC: 32.8 g/dL (ref 32.0–36.0)
MCV: 96.8 fL (ref 79.3–98.0)
MONO#: 1.5 10*3/uL — ABNORMAL HIGH (ref 0.1–0.9)
MONO%: 9.2 % (ref 0.0–14.0)
NEUT#: 11.1 10*3/uL — ABNORMAL HIGH (ref 1.5–6.5)
NEUT%: 69.7 % (ref 39.0–75.0)
Platelets: 438 10*3/uL — ABNORMAL HIGH (ref 140–400)
RBC: 3.13 10*6/uL — AB (ref 4.20–5.82)
RDW: 19.7 % — ABNORMAL HIGH (ref 11.0–14.6)
WBC: 15.9 10*3/uL — ABNORMAL HIGH (ref 4.0–10.3)
lymph#: 2.8 10*3/uL (ref 0.9–3.3)

## 2015-02-20 LAB — PATHOLOGIST SMEAR REVIEW

## 2015-02-20 LAB — TECHNOLOGIST REVIEW

## 2015-02-20 LAB — CHCC SMEAR

## 2015-02-21 LAB — PATHOLOGIST SMEAR REVIEW

## 2015-03-02 ENCOUNTER — Other Ambulatory Visit: Payer: Self-pay | Admitting: Medical

## 2015-03-03 ENCOUNTER — Telehealth: Payer: Self-pay | Admitting: Medical

## 2015-03-03 NOTE — Telephone Encounter (Signed)
Is this okay to refill? 

## 2015-03-03 NOTE — Telephone Encounter (Signed)
I received medication refill request.  Med sent, but go ahead and schedule them for med check plus.  Let [name] know as this is her father.

## 2015-03-18 NOTE — Telephone Encounter (Signed)
Pt has an appt next week

## 2015-03-25 ENCOUNTER — Encounter: Payer: Self-pay | Admitting: Medical

## 2015-03-25 ENCOUNTER — Ambulatory Visit (INDEPENDENT_AMBULATORY_CARE_PROVIDER_SITE_OTHER): Payer: Medicare Other | Admitting: Medical

## 2015-03-25 VITALS — BP 122/68 | HR 78 | Temp 98.0°F | Wt 185.0 lb

## 2015-03-25 DIAGNOSIS — Z9181 History of falling: Secondary | ICD-10-CM | POA: Diagnosis not present

## 2015-03-25 DIAGNOSIS — J329 Chronic sinusitis, unspecified: Secondary | ICD-10-CM | POA: Insufficient documentation

## 2015-03-25 DIAGNOSIS — D473 Essential (hemorrhagic) thrombocythemia: Secondary | ICD-10-CM | POA: Diagnosis not present

## 2015-03-25 DIAGNOSIS — I8393 Asymptomatic varicose veins of bilateral lower extremities: Secondary | ICD-10-CM | POA: Insufficient documentation

## 2015-03-25 DIAGNOSIS — Z0000 Encounter for general adult medical examination without abnormal findings: Secondary | ICD-10-CM | POA: Diagnosis not present

## 2015-03-25 DIAGNOSIS — I10 Essential (primary) hypertension: Secondary | ICD-10-CM | POA: Insufficient documentation

## 2015-03-25 DIAGNOSIS — L821 Other seborrheic keratosis: Secondary | ICD-10-CM

## 2015-03-25 DIAGNOSIS — I8390 Asymptomatic varicose veins of unspecified lower extremity: Secondary | ICD-10-CM

## 2015-03-25 DIAGNOSIS — D631 Anemia in chronic kidney disease: Secondary | ICD-10-CM | POA: Insufficient documentation

## 2015-03-25 DIAGNOSIS — N3941 Urge incontinence: Secondary | ICD-10-CM | POA: Diagnosis not present

## 2015-03-25 DIAGNOSIS — I868 Varicose veins of other specified sites: Secondary | ICD-10-CM

## 2015-03-25 DIAGNOSIS — H9190 Unspecified hearing loss, unspecified ear: Secondary | ICD-10-CM | POA: Diagnosis not present

## 2015-03-25 DIAGNOSIS — N400 Enlarged prostate without lower urinary tract symptoms: Secondary | ICD-10-CM | POA: Diagnosis not present

## 2015-03-25 DIAGNOSIS — N189 Chronic kidney disease, unspecified: Secondary | ICD-10-CM

## 2015-03-25 DIAGNOSIS — R3912 Poor urinary stream: Secondary | ICD-10-CM | POA: Diagnosis not present

## 2015-03-25 DIAGNOSIS — F341 Dysthymic disorder: Secondary | ICD-10-CM | POA: Diagnosis not present

## 2015-03-25 DIAGNOSIS — Z23 Encounter for immunization: Secondary | ICD-10-CM | POA: Diagnosis not present

## 2015-03-25 LAB — LIPID PANEL
CHOLESTEROL: 116 mg/dL — AB (ref 125–200)
HDL: 24 mg/dL — AB (ref >=40)
LDL CALC: 52 mg/dL (ref <130)
TRIGLYCERIDES: 199 mg/dL — AB (ref <150)
Total CHOL/HDL Ratio: 4.8 Ratio (ref <=5.0)
VLDL: 40 mg/dL — AB (ref <30)

## 2015-03-25 MED ORDER — FLUOXETINE HCL 10 MG PO TABS: 10.0000 mg | ORAL_TABLET | Freq: Every day | ORAL | Status: DC

## 2015-03-25 MED ORDER — FLUOXETINE HCL 40 MG PO CAPS: 40.0000 mg | ORAL_CAPSULE | Freq: Every day | ORAL | Status: DC

## 2015-03-25 MED ORDER — SOLIFENACIN SUCCINATE 5 MG PO TABS: 5.0000 mg | ORAL_TABLET | Freq: Every day | ORAL | Status: DC

## 2015-03-25 MED ORDER — TERAZOSIN HCL 2 MG PO CAPS: 2.0000 mg | ORAL_CAPSULE | Freq: Every day | ORAL | Status: DC

## 2015-03-25 NOTE — Patient Instructions (Addendum)
MEDICARE PREVENTATIVE SERVICES (MALE) AND PERSONALIZED PLAN for  Nicholas [name] March 25, 2015  CONDITIONS OR RISKS IDENTIFIED TODAY:  Hearing loss  History of falls  Diet could be improved - less red meat, less eating out  Urinary frequency, urine stream changes, likely due to prostate enlargement   SPECIFIC RECOMMENDATIONS:  See your eye doctor yearly for routine vision care.  See your dentist yearly for routine dental care including hygiene visits twice yearly.  Work with your daughters to complete Advanced Directives - living will and health care power of attorney paperwork  Make sure you have a will in general  Try to walk daily for exercise  Keep your mind active with reading, puzzles, keeping up news such as reading the newspaper, using a calendar  I recommend a shingles vaccine. Check your insurance coverage, we can do it here, or it can be done at [facility]  I recommend we screen for osteoporosis  Lets consider an updated echocardiogram to check cardiac function.  The last time you had this done was 2008.  Consider dermatology for skin surveillance  Try and do more home cooking, eating out less, eating red meat and fried foods less   Influenza vaccine: updated today Pneumococcal vaccine: up to date Shingles vaccine: recommended Tdap vaccine: up to date Colonoscopy: not indicated due to age   Return pending labs   [place]:  Supplements:  . Take a daily baby Aspirin 81mg  at bedtime for heart health unless you have a history of gastrointestinal bleed, allergy to aspirin, or are already taking higher dose Aspirin or other antiplatelet or blood thinner medication.   . Consume 1200 mg of Calcium daily through dietary calcium or supplement if you are male age 48 or older, or men 64 and older.   Men aged 60-70 should consume 1000 mg of Calcium daily. . Take 600 IU of Vitamin D daily.  Take 800 IU of Calcium daily if  you are older than age 51.  . Take a general multivitamin daily.   Healthy diet: Eat a variety of foods, including fruits, vegetables, vegetable protein such as beans, lentils, tofu, and grains, such as rice.  Limit meat or animal protein, but if you eat meat, choose leans cuts such as chicken, fish, or [place].  Drink plenty of water daily.  Decrease saturated fat in the diet, avoid lots of red meat, processed foods, sweets, fast foods, and fried foods.  Limit salt and caffeine intake.  Exercise: Aerobic exercise helps maintain good heart health. Weight bearing exercise helps keep bones and muscles working strong.  We recommend at least 30-40 minutes of exercise most days of the week.   Fall prevention: Falls are the leading cause of injuries, accidents, and accidental deaths in people over the age of 17. Falling is a real threat to your ability to live on your own.  Causes include poor eyesight or poor hearing, illness, poor lighting, throw rugs, clutter in your home, and medication side effects causing dizziness or balance problems.  Such medications can include medications for depression, sleep problems, high blood pressure, diabetes, and heart conditions.   PREVENTION  Be sure your home is as safe as possible. Here are some tips:  Wear shoes with non-skid soles (not house slippers).   Be sure your home and outside area are well lit.   Use night lights throughout your house, including hallways and stairways.   Remove clutter and clean up spills on floors  and walkways.   Remove throw rugs or fasten them to the floor with carpet tape. Tack down carpet edges.   Do not place electrical cords across pathways.   Install grab bars in your bathtub, shower, and toilet area. Towel bars should not be used as a grab bar.   Install handrails on both sides of stairways.   Do not climb on stools or stepladders. Get someone else to help with jobs that require climbing.   Do not wax your floors at  all, or use a non-skid wax.   Repair uneven or unsafe sidewalks, walkways or stairs.   Keep frequently used items within reach.   Be aware of pets so you do not trip.  Get regular check-ups from your doctor, and take good care of yourself:  Have your eyes checked every year for vision changes, cataracts, glaucoma, and other eye problems. Wear eyeglasses as directed.   Have your hearing checked every 2 years, or anytime you or others think that you cannot hear well. Use hearing aids as directed.   See your caregiver if you have foot pain or corns. Sore feet can contribute to falls.   Let your caregiver know if a medicine is making you feel dizzy or making you lose your balance.   Use a cane, walker, or wheelchair as directed. Use walker or wheelchair brakes when getting in and out.   When you get up from bed, sit on the side of the bed for 1 to 2 minutes before you stand up. This will give your blood pressure time to adjust, and you will feel less dizzy.   If you need to go to the bathroom often, consider using a bedside commode.  Disease prevention:  If you smoke or chew tobacco, find out from your caregiver how to quit. It can literally save your life, no matter how long you have been a tobacco user. If you do not use tobacco, never begin. Medicare does cover some smoking cessation counseling.  Maintain a healthy diet and normal weight. Increased weight leads to problems with blood pressure and diabetes. We check your height, weight, and BMI as part of your yearly visit.  The Body Mass Index or BMI is a way of measuring how much of your body is fat. Having a BMI above 27 increases the risk of heart disease, diabetes, hypertension, stroke and other problems related to obesity. Your caregiver can help determine your BMI and based on it develop an exercise and dietary program to help you achieve or maintain this important measurement at a healthful level.  High blood pressure causes  heart and blood vessel problems.  Persistent high blood pressure should be treated with medicine if weight loss and exercise do not work.  We check your blood pressure as part of your yearly visit.  Avoid drinking alcohol in excess (more than two drinks per day).  Avoid use of street drugs. Do not share needles with anyone. Ask for professional help if you need assistance or instructions on stopping the use of alcohol, cigarettes, and/or drugs.  Brush your teeth twice a day with fluoride toothpaste, and floss once a day. Good oral hygiene prevents tooth decay and gum disease. The problems can be painful, unattractive, and can cause other health problems. Visit your dentist for a routine oral and dental checkup and preventive care every 6-12 months.   See your eye doctor yearly for routine screening for things like glaucoma.  Look at your skin regularly.  Use a mirror to look at your back. Notify your caregivers of changes in moles, especially if there are changes in shapes, colors, a size larger than a pencil eraser, an irregular border, or development of new moles.  Safety:  Use seatbelts 100% of the time, whether driving or as a passenger.  Use safety devices such as hearing protection if you work in environments with loud noise or significant background noise.  Use safety glasses when doing any work that could send debris in to the eyes.  Use a helmet if you ride a bike or motorcycle.  Use appropriate safety gear for contact sports.  Talk to your caregiver about gun safety.  Use sunscreen with a SPF (or skin protection factor) of 15 or greater.  Lighter skinned people are at a greater risk of skin cancer. Don't forget to also wear sunglasses in order to protect your eyes from too much damaging sunlight. Damaging sunlight can accelerate cataract formation.   If you have multiple sexual partners, or if you are not in a monogamous relationship, practice safe sex. Use condoms. Condoms are used to help  reduce the spread of sexually transmitted infections (or STIs).  Consider an HIV test if you have never been tested.  Consider routine screening for STIs if you have multiple sexual partners.   Keep carbon monoxide and smoke detectors in your home functioning at all times. Change the batteries every 6 months or use a model that plugs into the wall or is hard wired in.   END OF LIFE PLANNING/ADVANCED DIRECTIVES Advance health-care planning is deciding the kind of care you want at the end of life. While alert competent adults are able to exercise their rights to make health care and financial decisions, problems arise when an individual becomes unconscious, incapacitated, or otherwise unable to communicate or make such decisions. Advance health care directives are the legal documents in which you give written instructions about your choices limited, aggressive or palliative care if, in the future, you cannot speak for yourself.  Advanced directives include the following: [place] allows you to appoint someone to act as your health care agent to make health care decisions for you should it be determined by your health care provider that you are no longer able to make these decisions for yourself.  A Living Will is a legal document in which you can declare that under certain conditions you desire your life not be prolonged by extraordinary or artificial means during your last illness or when you are near death. We can provide you with sample advanced directives, you can get an attorney to prepare these for you, or you can visit [place] Secretary of State's website for additional information and resources at [email or web address]  Further, I recommend you have an attorney prepare a Will and Durable Power of Attorney if you haven't done so already.  Please get [place] a copy of your health care Advanced Directives.   PREVENTATIV E CARE RECOMMENDATIONS:  Vaccinations: We  recommend the following vaccinations as part of your preventative care:  Pneumococcal vaccine is recommended to protect against certain types of pneumonia.  This is normally recommended for adults age 78 or older once, or up to every 5 years for those at high risk.  The vaccine is also recommended for adults younger than 79 years old with certain underlying conditions that make them high risk for pneumonia.  Influenza vaccine is recommended to protect against seasonal influenza or "the flu." Influenza  is a serious disease that can lead to hospitalization and sometimes even death. Traditional flu vaccines (called trivalent vaccines) are made to protect against three flu viruses; an influenza A (H1N1) virus, an influenza A (H3N2) virus, and an influenza B virus. In addition, there are flu vaccines made to protect against four flu viruses (called "quadrivalent" vaccines). These vaccines protect against the same viruses as the trivalent vaccine and an additional B virus.  We recommend the high dose influenza vaccine to those 65 years and older.  Hepatitis B vaccine to protect against a form of infection of the liver by a virus acquired from blood or body fluids, particularly for high risk groups.  Td or Tdap vaccine to protect against Tetanus, diphtheria and pertussis which can be very serious.  These diseases are caused by bacteria.  Diphtheria and pertussis are spread from person to person through coughing or sneezing.  Tetanus enters the body through cuts, scratches, or wounds.  Tetanus (Lockjaw) causes painful muscle tightening and stiffness, usually all over the body.  Diphtheria can cause a thick coating to form in the back of the throat.  It can lead to breathing problems, paralysis, heart failure, and death.  Pertussis (Whooping Cough) causes severe coughing spells, which can cause difficulty breathing, vomiting and disturbed sleep.  Td or Tdap is usually given every 10 years.  Shingles vaccine to  protect against Varicella Zoster if you are older than age 94, or younger than 79 years old with certain underlying illness.    Cancer Screening: Most routine colon cancer screening begins at the age of 23.  Subsequent colonoscopies are performed either every 5-10 years for normal screening, or every 2-5 years for higher risks patients, up until age 36 years of age. Annual screening is done with easy to use take-home tests to check for hidden blood in the stool called hemoccult tests.  Sigmoidoscopy or colonoscopy can detect the earliest forms of colon cancer and is life saving. These tests use a small camera at the end of a tube to directly examine the colon.   Prostate cancer screening usually begins at age 26 years old, or younger age for those with higher risk.  Those at higher risk include African-Americans or having a family history of prostate cancer. There are two types of tests for prostate cancer - Prostate-specific antigen (PSA) testing. Recent studies raise questions about prostate cancer using PSA and you should discuss this with your caregiver.  The other type of test is the digital rectal exam (in which your doctor's lubricated and gloved finger feels for enlargement of the prostate through the anus).  We routinely stop testing at age 71 years of age.  Osteoporosis Screening: Screening for osteoporosis usually begins at age 83 for women, and can be done as frequent as every 2 years.  However, women or men with higher risk of osteoporosis may be screened earlier than age 39.  Osteoporosis or low bone mass is diminished bone strength from alterations in bone architecture leading to bone fragility and increased fracture risk.     Cardiovascular Screening: Fat and cholesterol leaves deposits in your arteries that can block them. This causes heart disease and vessel disease elsewhere in your body.  If your cholesterol is found to be high, or if you have heart disease or certain other medical  conditions, then you may need to have your cholesterol monitored frequently and be treated with medication. Cardiovascular screening in the form of lab tests for cholesterol, HDL and  triglycerides can be done every 5 years.  A screening electrocardiogram can be done as part of the Welcome to Medicare physical.  Diabetes Screening: Diabetes screening can be done at least every 3 years for those with risk factors,  or every 6-10months for prediabetic patients.  Screening includes fasting blood sugar test or glucose tolerance test.  Risk factors include hypertension, dyslipidemia, obesity, previously abnormal glucose tests, family history of diabetes, age 35 years or older, and history of gestations diabetes.   AAA (abdominal aortic aneurysm) Screening: Medicare allows for a one time ultrasound to screen for abdominal aortic aneurysm if done as a referral as part of the Welcome to Medicare exam.  Men eligible for this screening include those men between age 63-34 years of age who have smoked at least 100 cigarettes in his lifetime and/or has a family history of AAA.  HIV Screening:  Medicare allows for yearly screening for patients at high risk for contracting HIV disease.

## 2015-03-25 NOTE — Progress Notes (Addendum)
Nicholas [name] is a 81 y.o. male who presents for [employer] Annual Wellness Visit.  Date of last medicare wellness visit was unknown Chief Complaint  Patient presents with  . Annual Exam    hd flu given. fasting. dr.magrinet for his high blood platlets. no problems or concerns.    Names of Other Physician/Practitioners you currently use: Dr. Sarajane Jews Magrinat - oncology/hematology Dorothea Ogle, PA-C here with Dr. Redmond School [facility] Dermatology, Toni Arthurs, NP No other current medical providers  Eating 3 meals most days.   Eats combination of quick easy meals at home and some eating out.    Eats out a fair amount out of convenience.  Wife has had strokes ,and he is her primary caretaker.   Eats a lot of red meat of late, hamburgers for lunch, steak occasionally.  Denies sleep issues, mood is not down.   Takes care of wife.   Her memory has declined a good bit.  He is not getting much exercise.    Has some hearing difficulty but doesn't want to pursue hearing aids at this time.  Here for flu shot today.    Has some increased urination.   Gets up at least once nightly to urinate.  Urine stream is decreased.    Sees dentist regularly. Hasn't seen eye doctor in a few years.  -  No problems with bowel.    History reviewed: allergies, current medications, past family history, past medical history, past social history, past surgical history and problem list  Current Problems (verified) Patient Active Problem List   Diagnosis Date Noted  . Anemia in chronic kidney disease 03/25/2015  . Essential hypertension 03/25/2015  . Essential thrombocytosis ([place]) 03/25/2015  . Need for prophylactic vaccination and inoculation against influenza 03/25/2015  . Urge incontinence 03/25/2015  . BPH (benign prostatic hypertrophy) 03/25/2015  . Encounter for health maintenance examination in adult 03/25/2015  . Seborrheic keratoses 03/25/2015  . Hearing loss 03/25/2015  . Chronic infection of sinus  03/25/2015  . History of fall 03/25/2015  . Weak urine stream 03/25/2015  . Varicose veins 03/25/2015  . Sinusitis, chronic 04/04/2013  . Dysthymia 10/21/2010    Immunization History  Administered Date(s) Administered  . Influenza Split 03/29/2011, 03/14/2012  . Influenza Whole 03/26/2008, 04/24/2009, 04/24/2010  . Influenza, High Dose Seasonal PF 04/08/2014, 03/25/2015  . Influenza,inj,Quad PF,36+ Mos 03/08/2013  . Pneumococcal Conjugate-13 06/26/2014  . Pneumococcal Polysaccharide-23 03/24/2012  . Tdap 08/23/2011    Risk Factors: Tobacco History  Smoking status  . Former Smoker  . Quit date: 06/14/1958  Smokeless tobacco  . Never Used   Alcohol Current alcohol use: none  Caffeine Current caffeine use: limited  Exercise Current exercise habits: The patient does not participate in regular exercise at present.   Nutrition/Diet Current diet: usually an oatmeal cookie and banana for breakfast, hamburger for lunch and fast food restaurant, and supper either home cooked or eating out, steak at least a few times per month  Depression Screen Nurse depression screen reviewed.  Activities of Daily Living Nurse ADLs screen reviewed.  Vision Difficulties: No  Hearing Difficulties: Yes Do you often ask people to speak up or repeat themselves? Yes Do you experience ringing or noises in your ears? No Do you have difficulty understanding soft or whispered voices? Yes  Cognition  Do you feel that you have a problem with memory? No  Do you often misplace items? No  Do you feel safe at home?  Yes  Advanced directives  Does patient have a [place]? No Does patient have a Living Will? No    Objective:   BP 122/68 mmHg  Pulse 78  Temp(Src) 98 F (36.7 C)  Wt 185 lb (83.915 kg)  General appearance: alert, no distress, WD/WN, white male Cognitive Testing  Alert? Yes  Normal Appearance?Yes  Oriented to person? Yes  Place? Yes   Time?  Yes  Recall of three objects?  Yes  Can perform simple calculations? Yes  Displays appropriate judgment?Yes  Can read the correct time from a watch face?Yes  HEENT: normocephalic, sclerae anicteric, TMs pearly, nares patent, no discharge or erythema, pharynx normal Oral cavity: MMM, no lesions Neck: supple, no lymphadenopathy, no thyromegaly, no masses, no bruits Heart: RRR, normal S1, S2, no murmurs Lungs: CTA bilaterally, no wheezes, rhonchi, or rales Abdomen: +bs, soft, non tender, non distended, no masses, no hepatomegaly, no splenomegaly GU: normal male, circumcised, no mass, no tenderness, no hernia, right inguinal faint hernia scar Skin: face neck arms and torso with several solar lentigo, several seborrheic keratoses of back and chest, 1 large raised verruca appearing brown lesion 2 cm base and 2cm raised verrucal lesion form right upper chest just right of midline, 76mm raised roundish yellow brown verrucal lesion of right forearm, no other specific worrisome lesions Musculoskeletal: nontender, no swelling, no obvious deformity Extremities: no edema, no cyanosis, no clubbing Pulses: 2+ symmetric, upper and lower extremities, normal cap refill Neurological: alert, oriented x 3, CN2-12 intact, strength normal upper extremities and lower extremities, sensation normal throughout, DTRs 2+ throughout, no cerebellar signs, gait normal Psychiatric: normal affect, behavior normal, pleasant DRE - declined    Adult ECG Report  Indication: hypertension  Rate: 74 bpm  Rhythm: normal sinus rhythm  QRS Axis: 9 degrees  PR Interval: 188ms  QRS Duration: 20ms  QTc: 434ms  Conduction Disturbances: none  Other Abnormalities: none  Patient's cardiac risk factors are: advanced age (older than 45 for men, 3 for women), hypertension, male gender and sedentary lifestyle.  EKG comparison: none  Narrative Interpretation: normal EKG      Assessment:   Encounter Diagnoses  Name Primary?  .  Essential hypertension Yes  . Encounter for health maintenance examination in adult   . Anemia in chronic kidney disease   . Essential thrombocytosis ([place])   . Dysthymia   . Urge incontinence   . BPH (benign prostatic hypertrophy)   . Weak urine stream   . Need for prophylactic vaccination and inoculation against influenza   . Seborrheic keratoses   . Hearing loss, unspecified laterality   . Chronic sinusitis, unspecified location   . History of fall   . Varicose veins     Plan:   During the course of the visit the patient was educated and counseled about appropriate screening and preventive services including:    Pneumococcal vaccine   Influenza vaccine  Bone densitometry screening  Glaucoma screening  Nutrition counseling   Advanced directives: has NO advanced directive  - add't info requested. Referral to SW: no   CONDITIONS OR RISKS IDENTIFIED TODAY:  Hearing loss  History of falls  Diet could be improved - less red meat, less eating out  Urinary frequency, urine stream changes, likely due to prostate enlargement - begin trial of Vesicare.   SPECIFIC RECOMMENDATIONS:  See your eye doctor yearly for routine vision care.  See your dentist yearly for routine dental care including hygiene visits twice yearly.  Work with your daughters to  complete Advanced Directives - living will and health care power of attorney paperwork  Make sure you have a will in general  Try to walk daily for exercise  Keep your mind active with reading, puzzles, keeping up news such as reading the newspaper, using a calendar  I recommend a shingles vaccine. Check your insurance coverage, we can do it here, or it can be done at [facility]  I recommend we screen for osteoporosis  Lets consider an updated echocardiogram to check cardiac function.  The last time you had this done was 2008.  Consider dermatology for skin surveillance  Try and do more home cooking, eating  out less, eating red meat and fried foods less  He will see dermatology for surveillance  Counseled on the influenza virus vaccine.  Vaccine information sheet given.  Influenza vaccine given after consent obtained.  Influenza vaccine: updated today Pneumococcal vaccine: up to date Shingles vaccine: recommended Tdap vaccine: up to date Colonoscopy: not indicated due to age  Reviewed plan and recommendations with his daughter [name] present.  Medicare Attestation I have personally reviewed: The patient's medical and social history Their use of alcohol, tobacco or illicit drugs Their current medications and supplements The patient's functional ability including ADLs,fall risks, home safety risks, cognitive, and hearing and visual impairment Diet and physical activities Evidence for depression or mood disorders  The patient's weight, height, BMI, and visual acuity have been recorded in the chart.  I have made referrals, counseling, and provided education to the patient based on review of the above and I have provided the patient with a written personalized care plan for preventive services.     Crisoforo Oxford, PA-C   03/25/2015

## 2015-03-26 ENCOUNTER — Other Ambulatory Visit: Payer: Self-pay | Admitting: Medical

## 2015-03-26 LAB — VITAMIN D 25 HYDROXY (VIT D DEFICIENCY, FRACTURES): Vit D, 25-Hydroxy: 19 ng/mL — ABNORMAL LOW (ref 30–100)

## 2015-03-26 LAB — MICROALBUMIN / CREATININE URINE RATIO
CREATININE, URINE: 115.8 mg/dL
MICROALB/CREAT RATIO: 55.3 mg/g — AB (ref 0.0–30.0)
Microalb, Ur: 6.4 mg/dL — ABNORMAL HIGH (ref <2.0)

## 2015-03-26 MED ORDER — LISINOPRIL 5 MG PO TABS: 5.0000 mg | ORAL_TABLET | Freq: Every day | ORAL | Status: DC

## 2015-03-26 MED ORDER — FENOFIBRATE 145 MG PO TABS: 145.0000 mg | ORAL_TABLET | Freq: Every day | ORAL | Status: DC

## 2015-03-26 MED ORDER — VITAMIN D (ERGOCALCIFEROL) 1.25 MG (50000 UNIT) PO CAPS: 50000.0000 [IU] | ORAL_CAPSULE | ORAL | Status: DC

## 2015-04-29 ENCOUNTER — Ambulatory Visit (INDEPENDENT_AMBULATORY_CARE_PROVIDER_SITE_OTHER): Payer: Medicare Other | Admitting: Medical

## 2015-04-29 ENCOUNTER — Encounter: Payer: Self-pay | Admitting: Medical

## 2015-04-29 VITALS — BP 118/50 | HR 78 | Wt 183.0 lb

## 2015-04-29 DIAGNOSIS — E782 Mixed hyperlipidemia: Secondary | ICD-10-CM | POA: Diagnosis not present

## 2015-04-29 DIAGNOSIS — R809 Proteinuria, unspecified: Secondary | ICD-10-CM | POA: Diagnosis not present

## 2015-04-29 DIAGNOSIS — N400 Enlarged prostate without lower urinary tract symptoms: Secondary | ICD-10-CM

## 2015-04-29 DIAGNOSIS — E559 Vitamin D deficiency, unspecified: Secondary | ICD-10-CM | POA: Diagnosis not present

## 2015-04-29 DIAGNOSIS — I10 Essential (primary) hypertension: Secondary | ICD-10-CM | POA: Diagnosis not present

## 2015-04-29 NOTE — Progress Notes (Signed)
Subjective: Chief Complaint  Patient presents with  . Follow-up    on medication. said he cant tell a real difference.    Here for f/u on new medications added last visit.   In general feeling a little weak at times, not sure which medication is doing it.  Didn't feel this way until the new medications were added.   Regarding Vesicare, not sure its helping anything.  Last visit he mentioned urinary frequency, some nocturia 1-2 times per night, having some urinary hesitancy.   Today he mention this isn't as much as problem as he maybe suggested last visit.  Regardless, Vesicare hasn't changed anything.   He is compliant with Vit D weekly, fenofibrate daily, and lisinopril added after last visit.   He is c/o some aches in thighs at times.   Not sure if he had this prior or not. No other aggravating or relieving factors. No other complaint.  Objective: BP 118/50 mmHg  Pulse 78  Wt 183 lb (83.008 kg)  SpO2 94%  Gen: wd, wn, nad Oral MMM Heart: RRR, normal s1, s2, no murmurs Lungs clear Ext: no edema Pulses normal Thighs nontneder, normal LE strength   Assessment Encounter Diagnoses  Name Primary?  . BPH (benign prostatic hypertrophy) Yes  . Essential hypertension   . Microalbuminuria   . Vitamin D deficiency   . Mixed dyslipidemia     Plan: BPH - he doesn't seem to be bothered by urination issues at this time.   STOP vesicare.  Last visit he eluded to BPH vs OAB symptoms, but today not complaining of this and he doesn't think Vesicare made any difference in what he reported last time.  His daughter [name] who works here thinks he just was hesitant to continue the medication due to copay of $60.   Will watch and wait for now.  HTN - c/t Hytrin daily.  microalbuminuria - cut Lisinopril 5mg  to 1/2 tablet daily due to reported weakness and BP a little lower than we need  Vit D deficiency - c/t weekly Vit D  Mixed dyslipidemia - c/t fenofibrate for now, but call if ongoing thigh  weakness/aches  F/u fasting in 27mo

## 2015-04-29 NOTE — Patient Instructions (Signed)
Recommendations:  continue Hytrin daily for blood pressure  Start taking 1/2 tablet of Lisinopril 5mg  daily.   This was added last visit, but may be causing your blood pressure to run a little low which could in turn cause some weakness  Continue vitamin D/ergocalciferol weekly  continue Fenofibrate daily at bedtime for cholesterol  STOP Vesicare as it doesn't seem to be helping  Return fasting in 3 months  If you are still having weakness or other problems in the meantime let me know

## 2015-05-12 ENCOUNTER — Encounter: Payer: Self-pay | Admitting: Medical

## 2015-05-12 ENCOUNTER — Ambulatory Visit (INDEPENDENT_AMBULATORY_CARE_PROVIDER_SITE_OTHER): Payer: Medicare Other | Admitting: Medical

## 2015-05-12 VITALS — BP 140/60 | HR 95 | Wt 181.0 lb

## 2015-05-12 DIAGNOSIS — R809 Proteinuria, unspecified: Secondary | ICD-10-CM | POA: Diagnosis not present

## 2015-05-12 DIAGNOSIS — E782 Mixed hyperlipidemia: Secondary | ICD-10-CM | POA: Diagnosis not present

## 2015-05-12 DIAGNOSIS — M545 Low back pain: Secondary | ICD-10-CM | POA: Diagnosis not present

## 2015-05-12 DIAGNOSIS — D473 Essential (hemorrhagic) thrombocythemia: Secondary | ICD-10-CM | POA: Diagnosis not present

## 2015-05-12 DIAGNOSIS — Z789 Other specified health status: Secondary | ICD-10-CM

## 2015-05-12 LAB — POCT URINALYSIS DIPSTICK
Bilirubin, UA: NEGATIVE
Blood, UA: NEGATIVE
Glucose, UA: NEGATIVE
KETONES UA: NEGATIVE
LEUKOCYTES UA: NEGATIVE
Nitrite, UA: NEGATIVE
PH UA: 6
PROTEIN UA: NEGATIVE
SPEC GRAV UA: 1.025
Urobilinogen, UA: NEGATIVE

## 2015-05-12 MED ORDER — HYDROCODONE-ACETAMINOPHEN 5-325 MG PO TABS: 1.0000 | ORAL_TABLET | Freq: Four times a day (QID) | ORAL | Status: DC | PRN

## 2015-05-12 NOTE — Patient Instructions (Signed)
Recommendations: For the time being STOP both Tricor/Fenofibrate and STOP Lisinopril Use icy hot topical or hot wet over the back Begin Hydrocodone if needed for pain.  Caution as this may cause drowsiness OR you can use the Ibuprofen short term OTC if this works fine for the pain.   If not much improved by end of the week, then let me know.

## 2015-05-12 NOTE — Progress Notes (Signed)
Subjective: Chief Complaint  Patient presents with  . Back Pain    started beginning of lst week and the past few days that its gotten so much worse. said his kidneys are no better. thinks this is from medication. said he cant remember what medications he is taking   Here for back pain, right low back started 3 days ago. Denies injury, fall, trauma.   He thinks its related to starting Lisinopril and Tricor recently.   He has called and returned since adding these medications at recent visit in September, has been unhappy about adding these medications on.    He denies urinary change, blood in urine, urine odor, no stool changes, no abdominal pain, no fever  No paresthesias.  No other aggravating or relieving factors. No other complaint.  Past Medical History  Diagnosis Date  . Anxiety   . Dysthymia   . Hypertension   . Allergy   . Chronic sinusitis   . H/O echocardiogram 01/04/2007    LV function normal, 55-60% EF, mildly increased thickenss of LV wall  . Abnormal CT scan, sinus 10/04/06    pansinusitis, mildly deviated septum  . History of MRI of brain and brain stem 02/26/06    sinusitis, nonspecific deep white matter changes normal for age  . Essential thrombocytosis ([place])     sees hematology; Dr. Jana Hakim  . H/O echocardiogram 12/2006    mild LV wall thickening, EF 55-60%, LV function normal  . Seborrheic keratosis     sees Toni Arthurs, NP dermatology   ROS as in subjective   Objective: BP 140/60 mmHg  Pulse 95  Wt 181 lb (82.101 kg)  General appearance: alert, no distress, WD/WN Abdomen: +bs, soft, non tender, non distended, no masses, no hepatomegaly, no splenomegaly Back: tender along right paraspinal region lumbar area, +spasm, mild pain with flexion and extension.  No midline tenderness, no pelvic tenderness, normal hip ROM without pain.  No other deformity.  Ext: no edema Neuro: strength, sensation, DTRs of LE unremarkable, -SLR, normal heel and toe walk. Pulses: 2+  symmetric, upper and lower extremities, normal cap refill     Assessment: Encounter Diagnoses  Name Primary?  . Low back pain, unspecified back pain laterality, with sciatica presence unspecified Yes  . Medication intolerance   . Microalbuminuria   . Essential thrombocytosis ([place])   . Mixed dyslipidemia     Plan: I suspect muscle spasm and strain in left low back.   No specific indication for xray at this time, but consider if not improving.  Advised of my recommendations including few days of rest, can use [facility] but not hair dryer he is using for heat to the area, gentle stretching, and Hydrocodone prn for worse pain.   Limit NSAID use.    He either is convinced the new medications we added recently (Lisinopril and Tricor) is causing his back pain or he just simply is not willing to take these.  He has always been a little reluctant to add medications on.  We went back over the reasoning behind adding ACEi and Tricor given his recent abnormal labs for elevated creatinine, microabuminuria ratio and not at goal with lipids.  BP hasn't been running at goal either .  Nevertheless, for now, we will stop Tricor and Lisinopril at his request.    F/u with call back this week.

## 2015-05-22 ENCOUNTER — Other Ambulatory Visit (HOSPITAL_BASED_OUTPATIENT_CLINIC_OR_DEPARTMENT_OTHER): Payer: Medicare Other

## 2015-05-22 DIAGNOSIS — D75839 Thrombocytosis, unspecified: Secondary | ICD-10-CM

## 2015-05-22 DIAGNOSIS — D473 Essential (hemorrhagic) thrombocythemia: Secondary | ICD-10-CM | POA: Diagnosis present

## 2015-05-22 LAB — COMPREHENSIVE METABOLIC PANEL
ALBUMIN: 3.8 g/dL (ref 3.5–5.0)
ALK PHOS: 48 U/L (ref 40–150)
ALT: 17 U/L (ref 0–55)
ANION GAP: 13 meq/L — AB (ref 3–11)
AST: 26 U/L (ref 5–34)
BILIRUBIN TOTAL: 0.55 mg/dL (ref 0.20–1.20)
BUN: 25.6 mg/dL (ref 7.0–26.0)
CO2: 18 mEq/L — ABNORMAL LOW (ref 22–29)
CREATININE: 1.7 mg/dL — AB (ref 0.7–1.3)
Calcium: 9.2 mg/dL (ref 8.4–10.4)
Chloride: 112 mEq/L — ABNORMAL HIGH (ref 98–109)
EGFR: 36 mL/min/{1.73_m2} — AB (ref >90)
GLUCOSE: 117 mg/dL (ref 70–140)
Potassium: 4.2 mEq/L (ref 3.5–5.1)
SODIUM: 144 meq/L (ref 136–145)
TOTAL PROTEIN: 7.2 g/dL (ref 6.4–8.3)

## 2015-05-22 LAB — CBC WITH DIFFERENTIAL/PLATELET
BASO%: 0.8 % (ref 0.0–2.0)
Basophils Absolute: 0.1 10*3/uL (ref 0.0–0.1)
EOS ABS: 0.3 10*3/uL (ref 0.0–0.5)
EOS%: 1.8 % (ref 0.0–7.0)
HCT: 27.9 % — ABNORMAL LOW (ref 38.4–49.9)
HEMOGLOBIN: 9 g/dL — AB (ref 13.0–17.1)
LYMPH#: 2.3 10*3/uL (ref 0.9–3.3)
LYMPH%: 15.4 % (ref 14.0–49.0)
MCH: 31.1 pg (ref 27.2–33.4)
MCHC: 32.2 g/dL (ref 32.0–36.0)
MCV: 96.7 fL (ref 79.3–98.0)
MONO#: 1.4 10*3/uL — AB (ref 0.1–0.9)
MONO%: 9.5 % (ref 0.0–14.0)
NEUT%: 72.5 % (ref 39.0–75.0)
NEUTROS ABS: 10.6 10*3/uL — AB (ref 1.5–6.5)
PLATELETS: 419 10*3/uL — AB (ref 140–400)
RBC: 2.89 10*6/uL — ABNORMAL LOW (ref 4.20–5.82)
RDW: 19.7 % — AB (ref 11.0–14.6)
WBC: 14.6 10*3/uL — AB (ref 4.0–10.3)

## 2015-05-22 LAB — LACTATE DEHYDROGENASE: LDH: 416 U/L — ABNORMAL HIGH (ref 125–245)

## 2015-05-22 LAB — CHCC SMEAR

## 2015-05-22 LAB — TECHNOLOGIST REVIEW

## 2015-07-16 DIAGNOSIS — Z85828 Personal history of other malignant neoplasm of skin: Secondary | ICD-10-CM | POA: Diagnosis not present

## 2015-07-16 DIAGNOSIS — D485 Neoplasm of uncertain behavior of skin: Secondary | ICD-10-CM | POA: Diagnosis not present

## 2015-07-16 DIAGNOSIS — Z87898 Personal history of other specified conditions: Secondary | ICD-10-CM | POA: Diagnosis not present

## 2015-07-16 DIAGNOSIS — L821 Other seborrheic keratosis: Secondary | ICD-10-CM | POA: Diagnosis not present

## 2015-07-16 DIAGNOSIS — Z23 Encounter for immunization: Secondary | ICD-10-CM | POA: Diagnosis not present

## 2015-07-16 DIAGNOSIS — C44119 Basal cell carcinoma of skin of left eyelid, including canthus: Secondary | ICD-10-CM | POA: Diagnosis not present

## 2015-07-28 ENCOUNTER — Other Ambulatory Visit: Payer: Self-pay | Admitting: Oncology

## 2015-07-30 ENCOUNTER — Ambulatory Visit (INDEPENDENT_AMBULATORY_CARE_PROVIDER_SITE_OTHER): Payer: Medicare Other | Admitting: Medical

## 2015-07-30 ENCOUNTER — Encounter: Payer: Self-pay | Admitting: Medical

## 2015-07-30 VITALS — BP 118/60 | HR 91 | Wt 178.0 lb

## 2015-07-30 DIAGNOSIS — E559 Vitamin D deficiency, unspecified: Secondary | ICD-10-CM

## 2015-07-30 DIAGNOSIS — F341 Dysthymic disorder: Secondary | ICD-10-CM

## 2015-07-30 DIAGNOSIS — R809 Proteinuria, unspecified: Secondary | ICD-10-CM | POA: Diagnosis not present

## 2015-07-30 DIAGNOSIS — E782 Mixed hyperlipidemia: Secondary | ICD-10-CM

## 2015-07-30 DIAGNOSIS — I10 Essential (primary) hypertension: Secondary | ICD-10-CM

## 2015-07-30 MED ORDER — VITAMIN D3 25 MCG (1000 UNIT) PO TABS: 1000.0000 [IU] | ORAL_TABLET | Freq: Every day | ORAL | Status: DC

## 2015-07-30 MED ORDER — FLUOXETINE HCL 40 MG PO CAPS: 40.0000 mg | ORAL_CAPSULE | Freq: Every day | ORAL | Status: DC

## 2015-07-30 MED ORDER — FLUOXETINE HCL 10 MG PO TABS: 10.0000 mg | ORAL_TABLET | Freq: Every day | ORAL | Status: DC

## 2015-07-30 MED FILL — ANAGRELIDE HCL 0.5 MG CAP: 0.5 | 90 days supply | Qty: 90 | Fill #0

## 2015-07-30 NOTE — Progress Notes (Signed)
Subjective: Chief Complaint  Patient presents with  . Follow-up    fasting pt is not sure why. no more back pain.    Here for f/u.  Since last visit has had no more back pain.  Says he doesn't eat as well since wife don't cook anymore.  Doesn't eat lunch often. Not willing to take additional medications at this time, particularly the Lisinopril or fenofibrate.   Feels fine, doing fine.  Eats BID, getting out of the house some.  Wife is not getting out much.  He is her caregiver.  No other aggravating or relieving factors. No other complaint.  Past Medical History  Diagnosis Date  . Anxiety   . Dysthymia   . Hypertension   . Allergy   . Chronic sinusitis   . H/O echocardiogram 01/04/2007    LV function normal, 55-60% EF, mildly increased thickenss of LV wall  . Abnormal CT scan, sinus 10/04/06    pansinusitis, mildly deviated septum  . History of MRI of brain and brain stem 02/26/06    sinusitis, nonspecific deep white matter changes normal for age  . Essential thrombocytosis ([place])     sees hematology; Dr. Jana Hakim  . H/O echocardiogram 12/2006    mild LV wall thickening, EF 55-60%, LV function normal  . Seborrheic keratosis     sees Toni Arthurs, NP dermatology   ROS as in subjective  Objective: BP 118/60 mmHg  Pulse 91  Wt 178 lb (80.74 kg)  Wt Readings from Last 3 Encounters:  07/30/15 178 lb (80.74 kg)  05/12/15 181 lb (82.101 kg)  04/29/15 183 lb (83.008 kg)   Gen: wd, wn, nad [place], answers questions appropriately Otherwise not examined   Assessment: Encounter Diagnoses  Name Primary?  . Dysthymia Yes  . Vitamin D deficiency   . Mixed dyslipidemia   . Microalbuminuria   . Essential hypertension     Plan: C/t same medications, change to Vit D 1000 IU daily instead of weekly.  Noncompliance with lisinopril and fenofibrate due to back pain and not willing to use these medications.   We will continue to monitor weights.   Recheck 6mo for nurse visit weight  check.

## 2015-07-30 NOTE — Telephone Encounter (Signed)
Chart reviewed.

## 2015-08-21 ENCOUNTER — Other Ambulatory Visit (HOSPITAL_BASED_OUTPATIENT_CLINIC_OR_DEPARTMENT_OTHER): Payer: Medicare Other

## 2015-08-21 DIAGNOSIS — D75839 Thrombocytosis, unspecified: Secondary | ICD-10-CM

## 2015-08-21 DIAGNOSIS — D473 Essential (hemorrhagic) thrombocythemia: Secondary | ICD-10-CM | POA: Diagnosis present

## 2015-08-21 LAB — COMPREHENSIVE METABOLIC PANEL
ALT: 13 U/L (ref 0–55)
ANION GAP: 8 meq/L (ref 3–11)
AST: 21 U/L (ref 5–34)
Albumin: 3.5 g/dL (ref 3.5–5.0)
Alkaline Phosphatase: 82 U/L (ref 40–150)
BILIRUBIN TOTAL: 0.61 mg/dL (ref 0.20–1.20)
BUN: 17.7 mg/dL (ref 7.0–26.0)
CALCIUM: 8.6 mg/dL (ref 8.4–10.4)
CHLORIDE: 109 meq/L (ref 98–109)
CO2: 24 mEq/L (ref 22–29)
CREATININE: 1.6 mg/dL — AB (ref 0.7–1.3)
EGFR: 39 mL/min/{1.73_m2} — ABNORMAL LOW (ref >90)
Glucose: 91 mg/dl (ref 70–140)
Potassium: 4.6 mEq/L (ref 3.5–5.1)
Sodium: 141 mEq/L (ref 136–145)
TOTAL PROTEIN: 6.8 g/dL (ref 6.4–8.3)

## 2015-08-21 LAB — CBC WITH DIFFERENTIAL/PLATELET
BASO%: 0.9 % (ref 0.0–2.0)
BASOS ABS: 0.1 10*3/uL (ref 0.0–0.1)
EOS%: 1.2 % (ref 0.0–7.0)
Eosinophils Absolute: 0.2 10*3/uL (ref 0.0–0.5)
HCT: 30.5 % — ABNORMAL LOW (ref 38.4–49.9)
HGB: 9.6 g/dL — ABNORMAL LOW (ref 13.0–17.1)
LYMPH%: 18.5 % (ref 14.0–49.0)
MCH: 30.9 pg (ref 27.2–33.4)
MCHC: 31.6 g/dL — AB (ref 32.0–36.0)
MCV: 97.8 fL (ref 79.3–98.0)
MONO#: 1.8 10*3/uL — ABNORMAL HIGH (ref 0.1–0.9)
MONO%: 11 % (ref 0.0–14.0)
NEUT#: 10.9 10*3/uL — ABNORMAL HIGH (ref 1.5–6.5)
NEUT%: 68.4 % (ref 39.0–75.0)
Platelets: 446 10*3/uL — ABNORMAL HIGH (ref 140–400)
RBC: 3.12 10*6/uL — AB (ref 4.20–5.82)
RDW: 19.6 % — ABNORMAL HIGH (ref 11.0–14.6)
WBC: 16 10*3/uL — ABNORMAL HIGH (ref 4.0–10.3)
lymph#: 3 10*3/uL (ref 0.9–3.3)

## 2015-08-21 LAB — LACTATE DEHYDROGENASE: LDH: 378 U/L — AB (ref 125–245)

## 2015-08-21 LAB — TECHNOLOGIST REVIEW

## 2015-08-21 LAB — CHCC SMEAR

## 2015-09-22 DIAGNOSIS — C44119 Basal cell carcinoma of skin of left eyelid, including canthus: Secondary | ICD-10-CM | POA: Diagnosis not present

## 2015-10-12 ENCOUNTER — Telehealth: Payer: Self-pay | Admitting: Oncology

## 2015-10-12 NOTE — Telephone Encounter (Signed)
sw, pt and advised on earlier time on 6.22 due to Md on call....pt ok and aware of new time

## 2015-10-27 ENCOUNTER — Other Ambulatory Visit: Payer: Medicare Other

## 2015-10-27 ENCOUNTER — Telehealth: Payer: Self-pay

## 2015-10-27 NOTE — Progress Notes (Unsigned)
Pt came in today to have weight check per Audelia Acton his weight was 176.6 lb

## 2015-10-27 NOTE — Telephone Encounter (Signed)
Weight is stable, not losing weight.  I just wanted to monitor.  Tell him thanks for coming by

## 2015-10-27 NOTE — Telephone Encounter (Signed)
Pt is aware.  

## 2015-10-27 NOTE — Telephone Encounter (Signed)
Patient came in today for a weight check he was 176.6 lb

## 2015-11-06 MED FILL — ANAGRELIDE HCL 0.5 MG CAP: 0.5 | 90 days supply | Qty: 90 | Fill #1

## 2015-11-20 ENCOUNTER — Other Ambulatory Visit (HOSPITAL_BASED_OUTPATIENT_CLINIC_OR_DEPARTMENT_OTHER): Payer: Medicare Other

## 2015-11-20 ENCOUNTER — Ambulatory Visit: Payer: Medicare Other | Admitting: Oncology

## 2015-11-20 DIAGNOSIS — D473 Essential (hemorrhagic) thrombocythemia: Secondary | ICD-10-CM

## 2015-11-20 DIAGNOSIS — D75839 Thrombocytosis, unspecified: Secondary | ICD-10-CM

## 2015-11-20 LAB — COMPREHENSIVE METABOLIC PANEL
ALT: 12 U/L (ref 0–55)
AST: 19 U/L (ref 5–34)
Albumin: 3.6 g/dL (ref 3.5–5.0)
Alkaline Phosphatase: 71 U/L (ref 40–150)
Anion Gap: 8 mEq/L (ref 3–11)
BUN: 25.3 mg/dL (ref 7.0–26.0)
CALCIUM: 8.8 mg/dL (ref 8.4–10.4)
CHLORIDE: 109 meq/L (ref 98–109)
CO2: 23 meq/L (ref 22–29)
Creatinine: 1.7 mg/dL — ABNORMAL HIGH (ref 0.7–1.3)
EGFR: 35 mL/min/{1.73_m2} — ABNORMAL LOW (ref >90)
Glucose: 100 mg/dl (ref 70–140)
POTASSIUM: 4.2 meq/L (ref 3.5–5.1)
SODIUM: 140 meq/L (ref 136–145)
Total Bilirubin: 0.54 mg/dL (ref 0.20–1.20)
Total Protein: 7 g/dL (ref 6.4–8.3)

## 2015-11-20 LAB — CBC WITH DIFFERENTIAL/PLATELET
BASO%: 1.5 % (ref 0.0–2.0)
Basophils Absolute: 0.2 10*3/uL — ABNORMAL HIGH (ref 0.0–0.1)
EOS ABS: 0.2 10*3/uL (ref 0.0–0.5)
EOS%: 1.4 % (ref 0.0–7.0)
HEMATOCRIT: 29.6 % — AB (ref 38.4–49.9)
HGB: 9.6 g/dL — ABNORMAL LOW (ref 13.0–17.1)
LYMPH%: 16.4 % (ref 14.0–49.0)
MCH: 31 pg (ref 27.2–33.4)
MCHC: 32.5 g/dL (ref 32.0–36.0)
MCV: 95.4 fL (ref 79.3–98.0)
MONO#: 1.4 10*3/uL — ABNORMAL HIGH (ref 0.1–0.9)
MONO%: 9.8 % (ref 0.0–14.0)
NEUT#: 9.8 10*3/uL — ABNORMAL HIGH (ref 1.5–6.5)
NEUT%: 70.9 % (ref 39.0–75.0)
Platelets: 403 10*3/uL — ABNORMAL HIGH (ref 140–400)
RBC: 3.1 10*6/uL — AB (ref 4.20–5.82)
RDW: 19.5 % — ABNORMAL HIGH (ref 11.0–14.6)
WBC: 13.8 10*3/uL — AB (ref 4.0–10.3)
lymph#: 2.3 10*3/uL (ref 0.9–3.3)

## 2015-11-20 LAB — LACTATE DEHYDROGENASE: LDH: 398 U/L — AB (ref 125–245)

## 2015-11-20 LAB — TECHNOLOGIST REVIEW

## 2015-11-20 LAB — CHCC SMEAR

## 2015-12-04 ENCOUNTER — Telehealth: Payer: Self-pay | Admitting: Oncology

## 2015-12-04 ENCOUNTER — Ambulatory Visit (HOSPITAL_BASED_OUTPATIENT_CLINIC_OR_DEPARTMENT_OTHER): Payer: Medicare Other | Admitting: Oncology

## 2015-12-04 ENCOUNTER — Ambulatory Visit: Payer: Medicare Other | Admitting: Oncology

## 2015-12-04 VITALS — BP 142/54 | HR 82 | Temp 97.8°F | Resp 18 | Ht 72.0 in | Wt 176.5 lb

## 2015-12-04 DIAGNOSIS — N183 Chronic kidney disease, stage 3 unspecified: Secondary | ICD-10-CM | POA: Insufficient documentation

## 2015-12-04 DIAGNOSIS — D649 Anemia, unspecified: Secondary | ICD-10-CM | POA: Diagnosis not present

## 2015-12-04 DIAGNOSIS — D473 Essential (hemorrhagic) thrombocythemia: Secondary | ICD-10-CM | POA: Diagnosis not present

## 2015-12-04 MED ORDER — ANAGRELIDE HCL 0.5 MG PO CAPS: 0.5000 mg | ORAL_CAPSULE | Freq: Every day | ORAL | Status: DC

## 2015-12-04 NOTE — Telephone Encounter (Signed)
appt made and avs printed °

## 2015-12-04 NOTE — Progress Notes (Signed)
ID: Nicholas [name]   DOB: 21-Oct-1927  MR#: [id number]  [id number]  PCP: Wyatt Haste, MD GYN: SU:  OTHER MD:  CHIEF COMPLAINT:  Essential thrombocytosis and chronic anemia  CURRENT TREATMENT: Anagrelide, ASA  INTERVAL HISTORY: Nicholas [name] returns today for follow-up of his thrombocytosis. He continues on anagrelida 0.5 mg daily, which he obtains for $3/ month. He also takes a baby aspirin daily. He has had no unusual bleeding and no clotting episodes. He tolerates the medications with no side effects that he is aware of.   REVIEW OF SYSTEMS: He continues to be the primary caregiver for his wife, who has had several mini strokes and needs daily insulin. He also financially supports one of his daughters. The other daughter Nicholas [name] a works in Dr. Lanice Shirts office. She is the one he tends to lean on more. The patient is having some urinary dribbling, which is not new. He bruises easily. He feels forgetful. Aside from these issues a detailed review of systems today was entirely stable.  PAST MEDICAL HISTORY: Past Medical History  Diagnosis Date  . Anxiety   . Dysthymia   . Hypertension   . Allergy   . Chronic sinusitis   . H/O echocardiogram 01/04/2007    LV function normal, 55-60% EF, mildly increased thickenss of LV wall  . Abnormal CT scan, sinus 10/04/06    pansinusitis, mildly deviated septum  . History of MRI of brain and brain stem 02/26/06    sinusitis, nonspecific deep white matter changes normal for age  . Essential thrombocytosis ([place])     sees hematology; Dr. Jana Hakim  . H/O echocardiogram 12/2006    mild LV wall thickening, EF 55-60%, LV function normal  . Seborrheic keratosis     sees Toni Arthurs, NP dermatology    PAST SURGICAL HISTORY: Past Surgical History  Procedure Laterality Date  . Hernia repair      right inguinal repair  . Varicose vein surgery      right lower leg  . Tonsillectomy      childhood    FAMILY HISTORY Family History  Problem  Relation Age of Onset  . Heart disease Father   . Stroke Father   . Cancer Neg Hx   . COPD Neg Hx   . Diabetes Neg Hx     SOCIAL HISTORY:  (Reviewed 11/28/2013) The patient lives with his wife, who suffers from diabetes and emphysema and has had some small strokes. She is able to care for herself. He gives her her insulin shots. He has 2 daughters, Nicholas [name] [place] and [name] [facility] [place] [facility]. He has 3 grandchildren.[name]    ADVANCED DIRECTIVES: the patient tells me his daughter Nicholas [name] would make health care decisions if he is incapacitated   HEALTH MAINTENANCE: (Updated 11/28/2013) Social History  Substance Use Topics  . Smoking status: Former Smoker    Quit date: 06/14/1958  . Smokeless tobacco: Never Used  . Alcohol Use: No     Colonoscopy:  Not on file  PSA: Not on file  Bone density: Never  Lipid panel: October 2014  Allergies  Allergen Reactions  . Erythromycin     Current Outpatient Prescriptions  Medication Sig Dispense Refill  . acetaminophen (TYLENOL) 500 MG tablet Take 500 mg by mouth every 6 (six) hours as needed. Reported on 07/30/2015    . anagrelide (AGRYLIN) 0.5 MG capsule TAKE 1 CAPSULE BY MOUTH ONCE DAILY 90 capsule 1  .  aspirin 81 MG tablet Take 81 mg by mouth daily.      . cholecalciferol (VITAMIN D) 1000 units tablet Take 1 tablet (1,000 Units total) by mouth daily. 90 tablet 3  . FLUoxetine (PROZAC) 10 MG tablet Take 1 tablet (10 mg total) by mouth daily. 90 tablet 1  . FLUoxetine (PROZAC) 40 MG capsule Take 1 capsule (40 mg total) by mouth daily. 90 capsule 1  . HYDROcodone-acetaminophen (NORCO/VICODIN) 5-325 MG tablet Take 1 tablet by mouth every 6 (six) hours as needed for moderate pain. (Patient not taking: Reported on 07/30/2015) 15 tablet 0  . sodium chloride (OCEAN) 0.65 % nasal spray Place 1 spray into the nose as needed.    . terazosin (HYTRIN) 2 MG capsule Take 1 capsule (2 mg total) by mouth daily. 90 capsule 3   . Vitamin D, Ergocalciferol, (DRISDOL) 50000 UNITS CAPS capsule Take 1 capsule (50,000 Units total) by mouth every 7 (seven) days. (Patient not taking: Reported on 07/30/2015) 4.5 capsule 3  . X-VIATE 40 % CREA      No current facility-administered medications for this visit.    OBJECTIVE: Elderly white man in no acute distress  Filed Vitals:   12/04/15 0854  BP: 142/54  [name]: 82  Temp: 97.8 F (36.6 C)  Resp: 18     Body mass index is 23.93 kg/(m^2).    ECOG FS: 1 Filed Weights   12/04/15 0854  Weight: 176 lb 8 oz (80.06 kg)   Sclerae unicteric, pupils round and equal Oropharynx clear and moist-- no thrush or other lesions No cervical or supraclavicular adenopathy Lungs no rales or rhonchi Heart regular rate and rhythm Abd soft, nontender, positive bowel sounds, no palpable splenomegaly MSK no focal spinal tenderness, no upper extremity lymphedema Neuro: nonfocal, well oriented, appropriate affect    LAB RESULTS: Results for Nicholas [name], Nicholas [name] (MRN [id number]) as of 12/04/2015 09:16  Ref. Range 11/26/2014 13:59 02/20/2015 14:43 05/22/2015 14:30 08/21/2015 14:26 11/20/2015 14:37  Platelets Latest Ref Range: 140-400 10e3/uL 386 438 (H) 419 (H) 446 (H) 403 (H)  Results for Nicholas [name], Nicholas [name] (MRN [id number]) as of 12/04/2015 09:16  Ref. Range 11/26/2014 13:59 02/20/2015 14:43 05/22/2015 14:30 08/21/2015 14:26 11/20/2015 14:37  WBC Latest Ref Range: 4.0-10.3 10e3/uL 15.7 (H) 15.9 (H) 14.6 (H) 16.0 (H) 13.8 (H)    Lab Results  Component Value Date   WBC 13.8* 11/20/2015   NEUTROABS 9.8* 11/20/2015   HGB 9.6* 11/20/2015   HCT 29.6* 11/20/2015   MCV 95.4 11/20/2015   PLT 403* 11/20/2015      Chemistry      Component Value Date/Time   NA 140 11/20/2015 1437   NA 140 06/26/2014 0958   K 4.2 11/20/2015 1437   K 4.3 06/26/2014 0958   CL 107 06/26/2014 0958   CO2 23 11/20/2015 1437   CO2 26 06/26/2014 0958   BUN 25.3 11/20/2015 1437   BUN 21 06/26/2014 0958   CREATININE 1.7*  11/20/2015 1437   CREATININE 1.46* 06/26/2014 0958      Component Value Date/Time   CALCIUM 8.8 11/20/2015 1437   CALCIUM 8.9 06/26/2014 0958   ALKPHOS 71 11/20/2015 1437   ALKPHOS 59 04/04/2013 0859   AST 19 11/20/2015 1437   AST 19 04/04/2013 0859   ALT 12 11/20/2015 1437   ALT 13 04/04/2013 0859   BILITOT 0.54 11/20/2015 1437   BILITOT 0.5 04/04/2013 0859       STUDIES: No results found.  ASSESSMENT: 82 y.o.  Nicholas [name] man with   (1)  a history of essential thrombocytosis initially diagnosed December 2003,  controlled on anagrelide 0.5 mg daily and aspirin 81 mg daily.   (a) no evidence of malignant transformation  (2) anemia, likely secondary to chronic kidney disease   PLAN: Nicholas [name] is very stable as far as his hematologic problems is concerned. He has significant chronic kidney disease, likely the cause of his anemia, which is however stable. If the anemia became more of a problem he could be treated with aranesp.  He is now obtaining the anagrelide through the [facility] since his local pharmacy was unable to obtain it. Luckily the cost is still minimal.  He is going to continue to see me on a yearly basis indefinitely. He follows closely with Dr. Redmond School otherwise. He knows to call me for any problems that may develop before his next visit here.   [name], MD     12/04/2015

## 2015-12-12 ENCOUNTER — Telehealth: Payer: Self-pay | Admitting: Medical

## 2015-12-12 NOTE — Telephone Encounter (Signed)
It appears that the patient should have enough medication to last until August. Please have Shane follow up on this next week.

## 2015-12-12 NOTE — Telephone Encounter (Signed)
Pt called for refills of prozac 10 mg. [facility] [address]. Sending to [facility] as Audelia Acton as left for the day and will not be in Monday.

## 2016-02-12 MED FILL — ANAGRELIDE HCL 0.5 MG CAP: 0.5 | 90 days supply | Qty: 90 | Fill #0

## 2016-03-05 ENCOUNTER — Other Ambulatory Visit (INDEPENDENT_AMBULATORY_CARE_PROVIDER_SITE_OTHER): Payer: Medicare Other

## 2016-03-05 DIAGNOSIS — Z23 Encounter for immunization: Secondary | ICD-10-CM | POA: Diagnosis not present

## 2016-04-02 ENCOUNTER — Encounter: Payer: Self-pay | Admitting: Medical

## 2016-04-02 ENCOUNTER — Ambulatory Visit
Admission: RE | Admit: 2016-04-02 | Discharge: 2016-04-02 | Disposition: A | Discharge status: Home / Self Care | Payer: Medicare Other | Source: Ambulatory Visit | Attending: Medical | Admitting: Medical

## 2016-04-02 ENCOUNTER — Ambulatory Visit (INDEPENDENT_AMBULATORY_CARE_PROVIDER_SITE_OTHER): Payer: Medicare Other | Admitting: Medical

## 2016-04-02 VITALS — BP 126/62 | HR 81 | Temp 98.2°F | Ht 72.0 in | Wt 172.6 lb

## 2016-04-02 DIAGNOSIS — D473 Essential (hemorrhagic) thrombocythemia: Secondary | ICD-10-CM | POA: Diagnosis not present

## 2016-04-02 DIAGNOSIS — W19XXXA Unspecified fall, initial encounter: Secondary | ICD-10-CM | POA: Diagnosis not present

## 2016-04-02 DIAGNOSIS — M791 Myalgia: Secondary | ICD-10-CM | POA: Diagnosis not present

## 2016-04-02 DIAGNOSIS — M79604 Pain in right leg: Secondary | ICD-10-CM

## 2016-04-02 DIAGNOSIS — S300XXA Contusion of lower back and pelvis, initial encounter: Secondary | ICD-10-CM | POA: Diagnosis not present

## 2016-04-02 DIAGNOSIS — D631 Anemia in chronic kidney disease: Secondary | ICD-10-CM | POA: Diagnosis not present

## 2016-04-02 DIAGNOSIS — M7918 Myalgia, other site: Secondary | ICD-10-CM

## 2016-04-02 DIAGNOSIS — N189 Chronic kidney disease, unspecified: Secondary | ICD-10-CM

## 2016-04-02 DIAGNOSIS — S79911A Unspecified injury of right hip, initial encounter: Secondary | ICD-10-CM | POA: Diagnosis not present

## 2016-04-02 MED ORDER — HYDROCODONE-ACETAMINOPHEN 5-325 MG PO TABS: 1.0000 | ORAL_TABLET | Freq: Every evening | ORAL | 0 refills | Status: DC | PRN

## 2016-04-02 NOTE — Patient Instructions (Addendum)
Encounter Diagnoses  Name Primary?  . Contusion of buttock, initial encounter Yes  . Buttock pain   . Acute pain of right lower extremity   . Essential thrombocytosis ([place])   . Anemia in chronic kidney disease, unspecified CKD stage     Recommendations:  STOP aspirin for the next 7 days.  You can resume aspirin on 04/09/16  Begin the Hydrocodone pain pill at bedtime only for the next week or 2 if needed  If you don't need the stronger pain pill, then you can just use the Tylenol OTC at bedtime  You can take Advil 200mg  OTC, 2 tablets in the morning, and 2 tablets at lunch if needed  alternate ice/heat to the buttocks  Use pads or pillows to sit on for comfort  Consider padded toilet seat.

## 2016-04-02 NOTE — Progress Notes (Signed)
Subjective: Chief Complaint  Patient presents with  . Fall    x7 days, tripped over dog chain, bruising, pain in buttocks on R side, pain radiates down R leg,    Here with daughter [name].   He fell a week ago, got tripped up in dog chain.   Was walking the dog out of the house, feet got caught up in the dog's chain.  He has been taking care of his nieces' dog while she is tending to her newborn in the NICU.  He fell backwards onto the right buttock.  Denies head injury confusion, headache, numbness, tingling.   Mainly just has pain over the right buttock.  Has worsening bruising down the right posterior leg.  Using Advil OTC 2-3 tablets at a time, about 8 daily.  No other aggravating or relieving factors. No other complaint.  Past Medical History:  Diagnosis Date  . Abnormal CT scan, sinus 10/04/06   pansinusitis, mildly deviated septum  . Allergy   . Anxiety   . Chronic sinusitis   . Dysthymia   . Essential thrombocytosis ([place])    sees hematology; Dr. Jana Hakim  . H/O echocardiogram 01/04/2007   LV function normal, 55-60% EF, mildly increased thickenss of LV wall  . H/O echocardiogram 12/2006   mild LV wall thickening, EF 55-60%, LV function normal  . History of MRI of brain and brain stem 02/26/06   sinusitis, nonspecific deep white matter changes normal for age  . Hypertension   . Seborrheic keratosis    sees Toni Arthurs, NP dermatology   Current Outpatient Prescriptions on File Prior to Visit  Medication Sig Dispense Refill  . acetaminophen (TYLENOL) 500 MG tablet Take 500 mg by mouth every 6 (six) hours as needed. Reported on 07/30/2015    . anagrelide (AGRYLIN) 0.5 MG capsule Take 1 capsule (0.5 mg total) by mouth daily. 90 capsule 4  . aspirin 81 MG tablet Take 81 mg by mouth daily.      . cholecalciferol (VITAMIN D) 1000 units tablet Take 1 tablet (1,000 Units total) by mouth daily. 90 tablet 3  . FLUoxetine (PROZAC) 10 MG tablet Take 1 tablet (10 mg total) by mouth daily. 90  tablet 1  . FLUoxetine (PROZAC) 40 MG capsule Take 1 capsule (40 mg total) by mouth daily. 90 capsule 1  . sodium chloride (OCEAN) 0.65 % nasal spray Place 1 spray into the nose as needed.    . terazosin (HYTRIN) 2 MG capsule Take 1 capsule (2 mg total) by mouth daily. 90 capsule 3  . X-VIATE 40 % CREA      No current facility-administered medications on file prior to visit.    ROS as in subjective.  Objective: BP 126/62 (BP Location: Left Arm, Patient Position: Sitting, Cuff Size: Normal)   Pulse 81   Temp 98.2 F (36.8 C) (Oral)   Ht 6' (1.829 m)   Wt 172 lb 9.6 oz (78.3 kg)   SpO2 98%   BMI 23.41 kg/m   Gen: wd, wn, nad Large patches of red/brown/purplish bruising with some surrounding yellowish coloration over right buttock, progressing to gluteal cleft somewhat to left of gluteal cleft, and down posterior and posterolateral right thigh, there seems to be a more dense hematoma right over central right buttock measuring 9-10 cm diameter Tender over the right buttock hematoma, pain with getting out of chair or sitting down into the chair Pain with active flexion of hip with pain in buttock, but otherwise hips ROM doesn't  seem to cause only mild pain Nontender thigh and knee and lower leg otherwise Muscle mass decreased in generally of extremis symmetrically No calve asymmetry or tenderness or swelling Rest of legs neurovascularly intact Psych: pleasant, answers questions appropriately Neuro: CN2-12 intact, A&O x 3   Assessment: Encounter Diagnoses  Name Primary?  . Contusion of buttock, initial encounter Yes  . Buttock pain   . Acute pain of right lower extremity   . Essential thrombocytosis ([place])   . Anemia in chronic kidney disease, unspecified CKD stage   . Fall, initial encounter      Plan: Discussed fall, symptoms, exam findings.  Will get CBC and send for xray, stop aspirin temporarily.  Recommendations:  STOP aspirin for the next 7 days.  You can resume  aspirin on 04/09/16  Begin the Hydrocodone pain pill at bedtime only for the next week or 2 if needed  If you don't need the stronger pain pill, then you can just use the Tylenol OTC at bedtime  You can take Advil 200mg  OTC, 2 tablets in the morning, and 2 tablets at lunch if needed  alternate ice/heat to the buttocks  Use pads or pillows to sit on for comfort  Consider padded toilet seat.    Panagiotis was seen today for fall.  Diagnoses and all orders for this visit:  Contusion of buttock, initial encounter -     CBC with Differential/Platelet -     DG HIP UNILAT W OR W/O PELVIS 2-3 VIEWS RIGHT; Future  Buttock pain -     CBC with Differential/Platelet -     DG HIP UNILAT W OR W/O PELVIS 2-3 VIEWS RIGHT; Future  Acute pain of right lower extremity -     CBC with Differential/Platelet  Essential thrombocytosis ([facility]) -     CBC with Differential/Platelet  Anemia in chronic kidney disease, unspecified CKD stage -     CBC with Differential/Platelet  Fall, initial encounter  Other orders -     HYDROcodone-acetaminophen (NORCO/VICODIN) 5-325 MG tablet; Take 1 tablet by mouth at bedtime as needed for moderate pain.

## 2016-04-03 LAB — CBC WITH DIFFERENTIAL/PLATELET
BASOS PCT: 2 %
Basophils Absolute: 322 cells/uL — ABNORMAL HIGH (ref 0–200)
Eosinophils Absolute: 161 cells/uL (ref 15–500)
Eosinophils Relative: 1 %
HEMATOCRIT: 27 % — AB (ref 38.5–50.0)
Hemoglobin: 8.7 g/dL — ABNORMAL LOW (ref 13.2–17.1)
LYMPHS ABS: 2737 {cells}/uL (ref 850–3900)
LYMPHS PCT: 17 %
MCH: 31.4 pg (ref 27.0–33.0)
MCHC: 32.2 g/dL (ref 32.0–36.0)
MCV: 97.5 fL (ref 80.0–100.0)
MONO ABS: 1932 {cells}/uL — AB (ref 200–950)
MPV: 9.7 fL (ref 7.5–12.5)
Monocytes Relative: 12 %
NEUTROS ABS: 10948 {cells}/uL — AB (ref 1500–7800)
Neutrophils Relative %: 68 %
Platelets: 558 10*3/uL — ABNORMAL HIGH (ref 140–400)
RBC: 2.77 MIL/uL — AB (ref 4.20–5.80)
RDW: 18.5 % — AB (ref 11.0–15.0)
WBC: 16.1 10*3/uL — AB (ref 4.0–10.5)

## 2016-04-05 ENCOUNTER — Other Ambulatory Visit: Payer: Self-pay | Admitting: Medical

## 2016-04-05 DIAGNOSIS — D638 Anemia in other chronic diseases classified elsewhere: Secondary | ICD-10-CM

## 2016-04-05 LAB — PATHOLOGIST SMEAR REVIEW

## 2016-04-06 ENCOUNTER — Other Ambulatory Visit: Payer: Medicare Other

## 2016-04-06 DIAGNOSIS — D638 Anemia in other chronic diseases classified elsewhere: Secondary | ICD-10-CM | POA: Diagnosis not present

## 2016-04-06 LAB — CBC WITH DIFFERENTIAL/PLATELET
BASOS ABS: 312 {cells}/uL — AB (ref 0–200)
Basophils Relative: 2 %
EOS ABS: 156 {cells}/uL (ref 15–500)
Eosinophils Relative: 1 %
HCT: 27.6 % — ABNORMAL LOW (ref 38.5–50.0)
Hemoglobin: 8.7 g/dL — ABNORMAL LOW (ref 13.2–17.1)
LYMPHS PCT: 18 %
Lymphs Abs: 2808 cells/uL (ref 850–3900)
MCH: 31.1 pg (ref 27.0–33.0)
MCHC: 31.5 g/dL — ABNORMAL LOW (ref 32.0–36.0)
MCV: 98.6 fL (ref 80.0–100.0)
MONOS PCT: 12 %
MPV: 9.5 fL (ref 7.5–12.5)
Monocytes Absolute: 1872 cells/uL — ABNORMAL HIGH (ref 200–950)
Neutro Abs: 10452 cells/uL — ABNORMAL HIGH (ref 1500–7800)
Neutrophils Relative %: 67 %
PLATELETS: 476 10*3/uL — AB (ref 140–400)
RBC: 2.8 MIL/uL — ABNORMAL LOW (ref 4.20–5.80)
RDW: 18.7 % — ABNORMAL HIGH (ref 11.0–15.0)
WBC: 15.6 10*3/uL — ABNORMAL HIGH (ref 4.0–10.5)

## 2016-04-07 ENCOUNTER — Telehealth: Payer: Self-pay | Admitting: Medical

## 2016-04-07 ENCOUNTER — Other Ambulatory Visit: Payer: Medicare Other

## 2016-04-07 NOTE — Telephone Encounter (Signed)
Pt was called and informed of lab work. He will come in two weeks from now to have repeat labs. He states that he is feeling much better and the pain meds at night are really helping him be able to rest.

## 2016-04-27 DIAGNOSIS — H01002 Unspecified blepharitis right lower eyelid: Secondary | ICD-10-CM | POA: Diagnosis not present

## 2016-04-27 DIAGNOSIS — H01004 Unspecified blepharitis left upper eyelid: Secondary | ICD-10-CM | POA: Diagnosis not present

## 2016-04-27 DIAGNOSIS — H01001 Unspecified blepharitis right upper eyelid: Secondary | ICD-10-CM | POA: Diagnosis not present

## 2016-04-27 DIAGNOSIS — H524 Presbyopia: Secondary | ICD-10-CM | POA: Diagnosis not present

## 2016-04-27 DIAGNOSIS — H353221 Exudative age-related macular degeneration, left eye, with active choroidal neovascularization: Secondary | ICD-10-CM | POA: Diagnosis not present

## 2016-04-27 DIAGNOSIS — H5211 Myopia, right eye: Secondary | ICD-10-CM | POA: Diagnosis not present

## 2016-04-27 DIAGNOSIS — H01005 Unspecified blepharitis left lower eyelid: Secondary | ICD-10-CM | POA: Diagnosis not present

## 2016-04-27 DIAGNOSIS — H52221 Regular astigmatism, right eye: Secondary | ICD-10-CM | POA: Diagnosis not present

## 2016-04-28 DIAGNOSIS — Z961 Presence of intraocular lens: Secondary | ICD-10-CM | POA: Diagnosis not present

## 2016-04-28 DIAGNOSIS — H353111 Nonexudative age-related macular degeneration, right eye, early dry stage: Secondary | ICD-10-CM | POA: Diagnosis not present

## 2016-04-28 DIAGNOSIS — H353221 Exudative age-related macular degeneration, left eye, with active choroidal neovascularization: Secondary | ICD-10-CM | POA: Diagnosis not present

## 2016-04-28 DIAGNOSIS — H35363 Drusen (degenerative) of macula, bilateral: Secondary | ICD-10-CM | POA: Diagnosis not present

## 2016-04-30 ENCOUNTER — Other Ambulatory Visit: Payer: Self-pay

## 2016-05-03 ENCOUNTER — Encounter: Payer: Self-pay | Admitting: *Deleted

## 2016-05-03 NOTE — Progress Notes (Signed)
Help get this set up

## 2016-05-11 ENCOUNTER — Other Ambulatory Visit: Payer: Self-pay | Admitting: *Deleted

## 2016-05-11 NOTE — Patient Outreach (Signed)
[facility] [facility]) Care Management  05/11/2016  Nicholas [name] [birth year] [id number]  CSW received a new referral on patient from patient's RNCM with [place] [facility], Erenest Rasher indicating that patient would benefit from social work services and resources to assist with referrals to various community agencies and resources.  More specifically, [name] reported that patient's daughter, [name] requested that [place] contact her directly, instead of the patient, to discuss resources that may be available to him and his wife in the home.  [name] reported that patient is currently caring for his elderly wife, Nicholas [name], who is disabled and suffering from Dementia.  Patient was recently diagnosed with Macular Degeneration and having trouble managing his own health concerns.  [name] is wanting to know what resources may be available to patient and Mrs. Duty to assist in the home, as she would like to avoid long-term care placement for both, if at all possible.    CSW made an initial attempt to try and contact patient's daughter, Nicholas [name] today to perform the phone assessment on patient, as well as assess and assist with social needs and services, without success.  A HIPAA complaint message was left for Ms. Watters on voicemail.  CSW is currently awaiting a return call.  CSW will make a second outreach attempt in one week, if CSW does not receive a return call from Ms. Paluch in the meantime. [name], BSW, MSW, LCSW  Licensed Education officer, environmental Health System  Mailing [place] [address], [place], [place] [address] Physical Address-[address], [place], [place] [address] Toll Free Main # [phone] Fax # [phone] Cell # [phone]  Fax # [phone]  [name]

## 2016-05-20 ENCOUNTER — Other Ambulatory Visit: Payer: Self-pay

## 2016-05-20 ENCOUNTER — Other Ambulatory Visit: Payer: Self-pay | Admitting: *Deleted

## 2016-05-20 NOTE — Patient Outreach (Signed)
[facility]) Care Management  05/20/2016  Nicholas [name] 11/20/1927 [id number]   CSW made a second attempt to try and contact patient's daughter, [name] today to perform phone assessment, as well as assess and assist with social work needs and services, without success.  A HIPAA compliant message was left for [name].  If CSW does not receive a return call from Ms. Beals within the next week, CSW will make a third and final outreach attempt, before mailing an outreach letter to patient's home.  In the meantime, CSW has requested that [name], Care Management Assistant with [place] [facility] mail a packet of resource information to patient's home.  This packet of resource information will include a complete listing of all private agencies sitters. [name], BSW, MSW, LCSW  Licensed Education officer, environmental Health System  Mailing [name] [place], [place] [address] Physical Address-[address], [place], [place] [address] Toll Free Main # [phone] Fax # [phone] Cell # [phone]  Fax # [phone]  [name]

## 2016-05-22 ENCOUNTER — Other Ambulatory Visit: Payer: Self-pay | Admitting: Medical

## 2016-05-25 NOTE — Patient Outreach (Signed)
[place] [facility]) Care Management  05/25/2016  Nicholas [name] Feb 04, 1928 [id number]   Request received from [name], LCSW to mail patient private agency sitter information. Information mailed.   Jacqulynn Cadet  [facility] Care Management Assistant

## 2016-05-26 DIAGNOSIS — H353221 Exudative age-related macular degeneration, left eye, with active choroidal neovascularization: Secondary | ICD-10-CM | POA: Diagnosis not present

## 2016-05-27 ENCOUNTER — Encounter: Payer: Self-pay | Admitting: *Deleted

## 2016-05-27 ENCOUNTER — Other Ambulatory Visit: Payer: Self-pay | Admitting: *Deleted

## 2016-05-27 NOTE — Patient Outreach (Signed)
[facility] [facility]) Care Management  05/27/2016  Nicholas [name] 1927/10/11 [id number]   CSW made a third and final attempt to try and contact patient and patient's daughter, Nicholas [name] today to perform phone assessment, as well as assess and assist with social work needs and services, without success.  A HIPAA compliant message was left for patient and [name] on their home phone, as well as [name]'s work phone.  CSW  continues to await a return call.  CSW will mail an outreach letter to patient's home, encouraging patient and/or [name] to contact CSW at their earliest convenience, if they are interested in receiving social work services through [place] with [facility].  If CSW does not receive a return call from patient and/or [name] within the next 10 business days, CSW will proceed with case closure.  Required number of phone attempts will have been made and outreach letter mailed.   [name], BSW, MSW, LCSW  Licensed Education officer, environmental Health System  Mailing [place] [address] [place], [place] [address] Physical Address-[address], [place], [place] [address] Toll Free Main # [phone] Fax # [phone] Cell # [phone]  Fax # [phone]  [email or web address]

## 2016-05-28 ENCOUNTER — Other Ambulatory Visit: Payer: Self-pay | Admitting: Medical

## 2016-05-28 ENCOUNTER — Other Ambulatory Visit: Payer: Self-pay | Admitting: *Deleted

## 2016-05-28 ENCOUNTER — Telehealth: Payer: Self-pay | Admitting: Medical

## 2016-05-28 ENCOUNTER — Encounter: Payer: Self-pay | Admitting: *Deleted

## 2016-05-28 MED ORDER — FLUOXETINE HCL 40 MG PO CAPS: 40.0000 mg | ORAL_CAPSULE | Freq: Every day | ORAL | 1 refills | Status: DC

## 2016-05-28 MED ORDER — TERAZOSIN HCL 2 MG PO CAPS: 2.0000 mg | ORAL_CAPSULE | Freq: Every day | ORAL | 1 refills | Status: DC

## 2016-05-28 MED ORDER — TERAZOSIN HCL 2 MG PO CAPS
2.0000 mg | ORAL_CAPSULE | Freq: Every day | ORAL | 1 refills | Status: DC
Start: 2016-05-28 — End: 2016-10-06

## 2016-05-28 MED ORDER — FLUOXETINE HCL 10 MG PO TABS: 10.0000 mg | ORAL_TABLET | Freq: Every day | ORAL | 1 refills | Status: DC

## 2016-05-28 NOTE — Telephone Encounter (Signed)
done

## 2016-05-28 NOTE — Telephone Encounter (Signed)
ALERT PT MADE A MEDICARE WELL VISIT FOR 07/01/2016. Pt called for refills and made a appt for 07/01/2016 for a medicare well visit. Please refill Fluoxetine both 10 mg and 40 mg. Pt also needs terazosin. Please send to [place]

## 2016-05-28 NOTE — Telephone Encounter (Signed)
Called and advised pt's daughter that meds have been sent to pharmacy

## 2016-05-28 NOTE — Patient Outreach (Signed)
[facility] [facility]) Care Nicholas [name]  05/28/2016  Nicholas Nicholas [name] Aug 02, 1927 [id number]   CSW was able to make contact with patient's daughter, Nicholas Nicholas [name] to perform the initial phone assessment on patient, as well as assess and assist with social work needs and services.  CSW introduced self, explained role and types of services provided through [place] Nicholas [name] (Nicholas Nicholas [name]).  CSW further explained to Nicholas Nicholas [name], Nicholas Nicholas [name] that CSW works with patient's RNCM, also with [place] Nicholas [name], Nicholas Nicholas [name]. CSW then explained the reason for the call, indicating that Nicholas Nicholas [name] thought that Nicholas Nicholas [name]. Nicholas Nicholas [name] would benefit from social work services and resources to assist with referrals to various community agencies and resources for patient.  CSW obtained two HIPAA compliant identifiers from Nicholas Nicholas [name]. Nicholas [name], which included patient's name and date of birth. Nicholas Nicholas [name] admitted that she would like for patient and her mother, Nicholas Nicholas [name] to be able to remain in their own home and live as independently as possible, for as long as possible.  However, Nicholas Nicholas [name] realizes that there will come a time, in the very near future, that patient and Nicholas Nicholas [name] will require more care and supervision in the home.  Nicholas Nicholas [name] is reluctant to broach the subject with patient and Nicholas Nicholas [name] until after the holiday's, as she realizes that the subject may not be well-received. Nicholas Nicholas [name] indicated that it may be beneficial to receive resource information regarding light housekeeping duties, an aide to assist Nicholas Nicholas [name] with bathing, at least twice per week, grocery shopping and meal preparation, because she and her sister both work full-time and have no one else to rely upon.  CSW agreed to mail the following list of resource information directly to Nicholas Nicholas [name]: Programmer, systems for [employer] 2 Risk analyst (Living Will and [place] documents) [place]  Nicholas Nicholas [name] PCS ([place]) application and brochure CHRP ([place] [facility]) application and brochure Complete list of private agency sitters Complete list of home health agencies Brochure for [employer] through [employer] of [place] for [employer] Nicholas Nicholas [name]. Nicholas Nicholas [name] took down CSW's contact information, and was encouraged to contact CSW once the packet of resource information is received, or when Nicholas Nicholas [name]. Nicholas Nicholas [name] is ready to pursue various services, as CSW has agreed to assist with the referral process, as well as answer any questions she may have pertaining to the information received.  Nicholas Nicholas [name] voiced understanding and was agreeable to this plan. Nicholas Nicholas [name], Nicholas Nicholas [name], MSW, LCSW  Licensed Education officer, environmental Health System  Mailing [place] [address], [place], [place] [address] Physical Address-[address], [place], [place] [address] Toll Free [name] # [phone] Fax # [phone] Cell # [phone]  Fax # [phone]  [name]

## 2016-05-31 NOTE — Patient Outreach (Signed)
[facility]) Care Management  05/31/2016  Nicholas [name] 03-13-1928 [id number]   Request from [name], LCSW to mail community resources to patient's daughter Nicholas [name] at her home address. Resources to include are welcome packet, consent, [employer] coach, Financial controller, PCS application, CHRP, Museum/gallery curator, [place], [employer] and [employer] and [place].   Jacqulynn Cadet  [facility] Care Management Assistant

## 2016-05-31 NOTE — Progress Notes (Signed)
Noted  

## 2016-06-09 ENCOUNTER — Telehealth: Payer: Self-pay | Admitting: Medical

## 2016-06-09 NOTE — Telephone Encounter (Signed)
Call in both the 10mg  and 40mg  Prozac to [facility] [place], no refill

## 2016-06-09 NOTE — Telephone Encounter (Signed)
Called Prozac 10mg   & 40mg   [facility] per Shane's note

## 2016-06-09 NOTE — Telephone Encounter (Signed)
Forward to laura

## 2016-06-10 ENCOUNTER — Ambulatory Visit: Payer: Self-pay | Admitting: *Deleted

## 2016-06-16 ENCOUNTER — Other Ambulatory Visit: Payer: Self-pay | Admitting: *Deleted

## 2016-06-16 NOTE — Patient Outreach (Signed)
[facility] [place] [facility]) Care Management  06/16/2016  MAHIR PRABHAKAR Aug 13, 1927 [id number]   CSW made an attempt to try and contact patient's daughter, Debbie Bellucci today to ensure that she received the packet of resource information that CSW mailed to her home, as well as to answer any questions she may have; however, Ms. Landgren was unavailable.  CSW left a HIPAA compliant message on Ms. Manders's work Advertising account executive and is currently awaiting a return call.  CSW will make a second outreach attempt in one week, if CSW does not receive a return call from Ms. Csaszar in the meantime. [name], BSW, MSW, LCSW  Licensed Education officer, environmental Health System  Mailing [place] [address] [place], [place] [address] Physical Address-[address], [place], [place] [address] Toll Free Main # [phone] Fax # [phone] Cell # [phone]  Fax # [phone]  [name]

## 2016-06-17 ENCOUNTER — Other Ambulatory Visit: Payer: Self-pay | Admitting: *Deleted

## 2016-06-17 NOTE — Patient Outreach (Signed)
[facility] [facility]) Care Management  06/17/2016  Nicholas [name] 07/18/1927 [id number]  A return call was received from patient's daughter, Nicholas [name], in response to the HIPAA compliant message left by CSW earlier in the day; however, CSW was unavailable so Ms. Dewalt left a HIPAA compliant message.  In th message, Ms. Pitkin admits to receiving the packet of resource information mailed to her home.  Ms. Nomura reported that she and her sister plan to meet with patient and his wife this weekend to discuss long-term care options, as well as possible long-term care placement.  Ms. Morgenstern agreed to follow-up with CSW if she has questions and when she is ready to pursue options.  CSW will await a return call from Ms. Peek. [name], BSW, MSW, LCSW  Licensed Education officer, environmental Health System  Mailing [place] [address], [place], [place] [address] Physical Address-[address], [place], [place] [address] Toll Free Main # [phone] Fax # [phone] Cell # [phone]  Fax # [phone]  [name]

## 2016-06-22 MED FILL — ANAGRELIDE HCL 0.5 MG CAP: 0.5 | 90 days supply | Qty: 90 | Fill #1

## 2016-06-24 ENCOUNTER — Ambulatory Visit: Payer: Self-pay | Admitting: *Deleted

## 2016-06-29 DIAGNOSIS — H353221 Exudative age-related macular degeneration, left eye, with active choroidal neovascularization: Secondary | ICD-10-CM | POA: Diagnosis not present

## 2016-07-01 ENCOUNTER — Ambulatory Visit: Payer: Medicare Other | Admitting: Medical

## 2016-07-02 ENCOUNTER — Other Ambulatory Visit: Payer: Self-pay | Admitting: *Deleted

## 2016-07-02 NOTE — Patient Outreach (Signed)
[facility]) Care Management  07/02/2016  Nicholas [name] 1928-02-11 [id number]   CSW made an attempt to try and contact patient's daughter, [name] today to follow-up regarding social work services and resources for patient; however, Mrs. Waldeck was unavailable.  A HIPAA compliant message was left on voicemail.  CSW is currently awaiting a return call.  If CSW does not receive a return call from patient within the next week, CSW will make another outreach attempt. [name], BSW, MSW, LCSW  Licensed Education officer, environmental Health System  Mailing [place] [address], [place], [place] [address] Physical Address-[address], [place], [place] [address] Toll Free Main # [phone] Fax # [phone] Cell # [phone]  Fax # [phone]  [name]

## 2016-07-05 ENCOUNTER — Other Ambulatory Visit: Payer: Self-pay | Admitting: *Deleted

## 2016-07-05 NOTE — Patient Outreach (Signed)
[facility]) Care Management  07/05/2016  HENDRICK PAVICH 10/24/1927 [id number]   A return call was received from patient's daughter, Imir Brumbach; however, CSW was unavailable at the time of the call.  Ms. Abdallah left a message for CSW on voicemail.  CSW attempted to return Ms. Dunsworth's call today; however, Ms. Trinka was unavailable.  CSW left a HIPAA compliant message for Ms. Ballow on the general voicemail at her office and is currently awaiting a return call.  CSW will make a second outreach attempt in one week, if CSW does not receive a return call from Ms. Alvelo in the meantime. [name], BSW, MSW, LCSW  Licensed Education officer, environmental Health System  Mailing [place] [address] [place], [place] [address] Physical Address-[address], [place], [place] [address] Toll Free Main # [phone] Fax # [phone] Cell # [phone]  Office # [phone] [email or web address]

## 2016-07-06 ENCOUNTER — Encounter: Payer: Self-pay | Admitting: *Deleted

## 2016-07-06 ENCOUNTER — Other Ambulatory Visit: Payer: Self-pay | Admitting: *Deleted

## 2016-07-06 NOTE — Patient Outreach (Signed)
[facility]) Care Management  07/06/2016  Adante T Detter JR 07/02/1927 [id number]   CSW received a call from patient's daughter, Kathy Hustead today, informing CSW that she has been able to work out 24 hour care and supervision for patient and his wife, Marie Dieterich in the home, no longer needing assistance from CSW with long-term care placement arrangements.  Ms. Sukhu went on to say that between her niece, her sister and herself, they have been able to work out a coverage schedule, as well as a meal schedule, for every day of the week, for patient and Mrs. Sedam.  Ms. Mazzuca reported that this process has been in place for two weeks and appears to be working well.  Ms. Regal indicated that her niece and sister live close by and are able to provide additional hours of care and supervision.  Ms. Lingelbach is trying to respect the wishes of her parents, by allowing them to stay in their own home for as long as possible.  CSW inquired as to how CSW could be of assistance to patient or Ms. Venus at this time.  Ms. Moger denied being able to identify any social work specific needs at present, but was most appreciative of assistance offered to her and her family by CSW, thus far.  Ms. Cincotta was encouraged to contact CSW directly of additional social work needs arise in the near future.  Ms. Sanderson voiced understanding and was agreeable to this plan. CSW will perform a case closure on patient, as all goals of treatment have been met from social work standpoint and no additional social work needs have been identified at this time.  CSW will fax an update to patient's Primary Care Physician, Dr. John Lalonde to ensure that they are aware of CSW's involvement with patient's plan of care.  CSW will submit a case closure request to Nicole Robinson, Care Management Assistant with [facility] Care Management, in the form of an In Basket message.   Joanna Saporito, BSW, MSW, LCSW  Licensed Clinical  Social Worker  [facility] Care Management [place] [facility]  Mailing Address-[address], [place], [place] [address] Physical Address-[address], [place], [place] [address] Toll Free Main # [phone] Fax # [phone] Cell # [phone]  Office # [phone] [email or web address]        

## 2016-07-08 ENCOUNTER — Ambulatory Visit: Payer: Self-pay | Admitting: *Deleted

## 2016-07-12 ENCOUNTER — Ambulatory Visit: Payer: Medicare Other | Admitting: *Deleted

## 2016-07-28 DIAGNOSIS — H35722 Serous detachment of retinal pigment epithelium, left eye: Secondary | ICD-10-CM | POA: Diagnosis not present

## 2016-07-28 DIAGNOSIS — H353111 Nonexudative age-related macular degeneration, right eye, early dry stage: Secondary | ICD-10-CM | POA: Diagnosis not present

## 2016-07-28 DIAGNOSIS — H353221 Exudative age-related macular degeneration, left eye, with active choroidal neovascularization: Secondary | ICD-10-CM | POA: Diagnosis not present

## 2016-08-06 ENCOUNTER — Other Ambulatory Visit: Payer: Self-pay | Admitting: Medical

## 2016-08-06 NOTE — Telephone Encounter (Signed)
Is this okay to refill? 

## 2016-09-01 DIAGNOSIS — H353221 Exudative age-related macular degeneration, left eye, with active choroidal neovascularization: Secondary | ICD-10-CM | POA: Diagnosis not present

## 2016-09-04 ENCOUNTER — Other Ambulatory Visit: Payer: Self-pay | Admitting: Medical

## 2016-09-06 NOTE — Telephone Encounter (Signed)
Called in refill  

## 2016-09-06 NOTE — Telephone Encounter (Signed)
Approve refill, but get him in for either medicare well visit or f/u on medication.  Remember this is Kathy's father

## 2016-09-06 NOTE — Telephone Encounter (Signed)
Can this pt have a refill on meds

## 2016-09-09 ENCOUNTER — Ambulatory Visit (INDEPENDENT_AMBULATORY_CARE_PROVIDER_SITE_OTHER): Payer: Medicare Other | Admitting: Family Medicine

## 2016-09-09 ENCOUNTER — Encounter: Payer: Self-pay | Admitting: Family Medicine

## 2016-09-09 ENCOUNTER — Telehealth: Payer: Self-pay | Admitting: Medical

## 2016-09-09 VITALS — BP 124/70 | HR 83 | Temp 98.2°F | Resp 16 | Wt 164.6 lb

## 2016-09-09 DIAGNOSIS — R197 Diarrhea, unspecified: Secondary | ICD-10-CM

## 2016-09-09 NOTE — Progress Notes (Signed)
   Subjective:    Patient ID: Nicholas [name], male    DOB: 11-Jun-1928, 82 y.o.   MRN: [id number]  HPI Chief Complaint  Patient presents with  . diarrhea    diarrhea since sunday.    He is here with complaints of a 4 day history of diarrhea that is watery, loose brown stool. Denies blood or pus. States he had multiple episodes of diarrhea for 3 days and states it seems to be slowing down since last night. Denies having any diarrhea last night and has had only one episode this morning. Has had some mild lower abdominal cramping, intermittent. States this seems to have resolved today. Reports feeling much better but tired and generally weak. No focal weakness.    No recent travel. No new medications. No known sick contacts. No raw or new foods.   States he has been eating and drinking plenty of fluids. Does not feel dehydrated. Reports he is urinating light yellow urine.   He has been taking Immodium.   Denies fever, chills, dizziness, chest pain, shortness of breath, vomiting.    Reviewed allergies, medications, past medical, surgical, family, and social history.   Review of Systems Pertinent positives and negatives in the history of present illness.     Objective:   Physical Exam  Constitutional: He is oriented to person, place, and time. He appears well-developed and well-nourished. No distress.  HENT:  Nose: Nose normal.  Mouth/Throat: Uvula is midline, oropharynx is clear and moist and mucous membranes are normal.  Eyes: Conjunctivae are normal. Pupils are equal, round, and reactive to light. No scleral icterus.  Neck: Trachea normal and full passive range of motion without pain. Neck supple.  Cardiovascular: Normal rate, regular rhythm, normal heart sounds and normal pulses.   Pulmonary/Chest: Effort normal and breath sounds normal.  Abdominal: Soft. Normal appearance and bowel sounds are normal. There is no tenderness. There is no rigidity, no rebound, no guarding, no CVA  tenderness, no tenderness at McBurney's point and negative Murphy's sign.  Lymphadenopathy:    He has no cervical adenopathy.       Right: No supraclavicular adenopathy present.       Left: No supraclavicular adenopathy present.  Neurological: He is alert and oriented to person, place, and time. No cranial nerve deficit or sensory deficit. Gait normal.  Skin: Skin is warm and dry. No rash noted. He is not diaphoretic. No pallor.  Psychiatric: He has a normal mood and affect. His speech is normal and behavior is normal. Thought content normal.   BP 124/70   Pulse 83   Temp 98.2 F (36.8 C) (Oral)   Resp 16   Wt 164 lb 9.6 oz (74.7 kg)   SpO2 98%   BMI 22.32 kg/m      Assessment & Plan:  Diarrhea, unspecified type  Discussed that his vitals are wnl and his exam is unremarkable.  He is euvolemic. Does not appear toxic or infectious. He seems to be significantly improved. Plan to have him continue hydrating and eating bland diet for the next 24-48 hours and if his symptoms return he is aware that he may need to go to the ED for rehydration. He and his daughter are in agreement with plan.  Follow up as needed.

## 2016-09-09 NOTE — Telephone Encounter (Signed)
Refill sent via pharmacy already filled

## 2016-09-09 NOTE — Patient Instructions (Signed)
You appear to be improving.   It is important that you stay well hydrated. Avoid dairy (milk, ice cream, and cheese).   Eat a bland diet for the next 24 hours.   If you start having multiple episodes of diarrhea, stomach pain, fever, or feeling very tired and weak then you will need to go the emergency room for some IV fluids and further evaluation.

## 2016-09-20 MED FILL — ANAGRELIDE HCL 0.5 MG CAP: 0.5 | 90 days supply | Qty: 90 | Fill #2

## 2016-10-05 ENCOUNTER — Encounter: Payer: Self-pay | Admitting: Medical

## 2016-10-05 ENCOUNTER — Other Ambulatory Visit: Payer: Self-pay | Admitting: Medical

## 2016-10-05 ENCOUNTER — Ambulatory Visit (INDEPENDENT_AMBULATORY_CARE_PROVIDER_SITE_OTHER): Payer: Medicare Other | Admitting: Medical

## 2016-10-05 VITALS — BP 110/66 | HR 59 | Ht 73.0 in | Wt 164.4 lb

## 2016-10-05 DIAGNOSIS — M25561 Pain in right knee: Secondary | ICD-10-CM

## 2016-10-05 DIAGNOSIS — G8929 Other chronic pain: Secondary | ICD-10-CM | POA: Insufficient documentation

## 2016-10-05 DIAGNOSIS — E559 Vitamin D deficiency, unspecified: Secondary | ICD-10-CM

## 2016-10-05 DIAGNOSIS — R809 Proteinuria, unspecified: Secondary | ICD-10-CM | POA: Diagnosis not present

## 2016-10-05 DIAGNOSIS — I8393 Asymptomatic varicose veins of bilateral lower extremities: Secondary | ICD-10-CM

## 2016-10-05 DIAGNOSIS — R5383 Other fatigue: Secondary | ICD-10-CM | POA: Diagnosis not present

## 2016-10-05 DIAGNOSIS — N183 Chronic kidney disease, stage 3 unspecified: Secondary | ICD-10-CM

## 2016-10-05 DIAGNOSIS — M25562 Pain in left knee: Secondary | ICD-10-CM

## 2016-10-05 DIAGNOSIS — R531 Weakness: Secondary | ICD-10-CM

## 2016-10-05 DIAGNOSIS — Z7189 Other specified counseling: Secondary | ICD-10-CM | POA: Insufficient documentation

## 2016-10-05 DIAGNOSIS — L821 Other seborrheic keratosis: Secondary | ICD-10-CM | POA: Diagnosis not present

## 2016-10-05 DIAGNOSIS — H3530 Unspecified macular degeneration: Secondary | ICD-10-CM

## 2016-10-05 DIAGNOSIS — E782 Mixed hyperlipidemia: Secondary | ICD-10-CM

## 2016-10-05 DIAGNOSIS — H9190 Unspecified hearing loss, unspecified ear: Secondary | ICD-10-CM

## 2016-10-05 DIAGNOSIS — N3941 Urge incontinence: Secondary | ICD-10-CM

## 2016-10-05 DIAGNOSIS — I10 Essential (primary) hypertension: Secondary | ICD-10-CM | POA: Diagnosis not present

## 2016-10-05 DIAGNOSIS — D473 Essential (hemorrhagic) thrombocythemia: Secondary | ICD-10-CM

## 2016-10-05 DIAGNOSIS — Z0000 Encounter for general adult medical examination without abnormal findings: Secondary | ICD-10-CM | POA: Diagnosis not present

## 2016-10-05 DIAGNOSIS — R413 Other amnesia: Secondary | ICD-10-CM

## 2016-10-05 DIAGNOSIS — N400 Enlarged prostate without lower urinary tract symptoms: Secondary | ICD-10-CM | POA: Diagnosis not present

## 2016-10-05 DIAGNOSIS — D631 Anemia in chronic kidney disease: Secondary | ICD-10-CM | POA: Diagnosis not present

## 2016-10-05 DIAGNOSIS — Z9181 History of falling: Secondary | ICD-10-CM

## 2016-10-05 DIAGNOSIS — N189 Chronic kidney disease, unspecified: Secondary | ICD-10-CM | POA: Diagnosis not present

## 2016-10-05 DIAGNOSIS — Z7185 Encounter for immunization safety counseling: Secondary | ICD-10-CM

## 2016-10-05 DIAGNOSIS — F341 Dysthymic disorder: Secondary | ICD-10-CM | POA: Diagnosis not present

## 2016-10-05 LAB — COMPREHENSIVE METABOLIC PANEL
ALT: 11 U/L (ref 9–46)
AST: 19 U/L (ref 10–35)
Albumin: 4 g/dL (ref 3.6–5.1)
Alkaline Phosphatase: 71 U/L (ref 40–115)
BILIRUBIN TOTAL: 0.7 mg/dL (ref 0.2–1.2)
BUN: 25 mg/dL (ref 7–25)
CO2: 23 mmol/L (ref 20–31)
Calcium: 9.1 mg/dL (ref 8.6–10.3)
Chloride: 105 mmol/L (ref 98–110)
Creat: 1.53 mg/dL — ABNORMAL HIGH (ref 0.70–1.11)
GLUCOSE: 81 mg/dL (ref 65–99)
POTASSIUM: 4.4 mmol/L (ref 3.5–5.3)
Sodium: 139 mmol/L (ref 135–146)
Total Protein: 6.9 g/dL (ref 6.1–8.1)

## 2016-10-05 LAB — VITAMIN B12: VITAMIN B 12: 871 pg/mL (ref 200–1100)

## 2016-10-05 LAB — TSH: TSH: 4.71 m[IU]/L — AB (ref 0.40–4.50)

## 2016-10-05 LAB — LIPID PANEL
CHOLESTEROL: 138 mg/dL (ref <200)
HDL: 32 mg/dL — ABNORMAL LOW (ref >40)
LDL CALC: 79 mg/dL (ref <100)
Total CHOL/HDL Ratio: 4.3 Ratio (ref <5.0)
Triglycerides: 135 mg/dL (ref <150)
VLDL: 27 mg/dL (ref <30)

## 2016-10-05 NOTE — Progress Notes (Signed)
Subjective:    Nicholas [name] is a 83 y.o. male who presents for Preventative Services visit and chronic medical problems/med check visit.    Primary Care Provider Wyatt Haste, MD here for primary care  Current Health Care Team:  Sees dentist Dr. Gloriann Loan, eye doctor, Dr. Posey Pronto  Dr. Jana Hakim, hematology  Dorothea Ogle and Dr. Redmond School here for PCP  Medical Services you may have received from other than [facility] providers in the past year (date may be approximate) Dr. Posey Pronto, having issues with macular degeneration  Exercise Current exercise habits: walking some but limited  Nutrition/Diet Current diet: ok, not as healthy as in the past.  Not eating as much cooked meals now since wife use to cook before she had worse health problems.   Eats out mostly now.  Eats breakfast at home.   Doesn't do much cooking at home, or does frozen dinners.  Depression Screen Depression screen [facility] 2/9 10/05/2016  Decreased Interest 0  Down, Depressed, Hopeless 0  PHQ - 2 Score 0    Activities of Daily Living Screen/Functional Status Survey Is the patient deaf or have difficulty hearing?: Yes Does the patient have difficulty seeing, even when wearing glasses/contacts?: Yes Does the patient have difficulty concentrating, remembering, or making decisions?: Yes Does the patient have difficulty walking or climbing stairs?: Yes (trouble climbing long flight of stairs) Does the patient have difficulty dressing or bathing?: No Does the patient have difficulty doing errands alone such as visiting a doctor's office or shopping?: No  Can patient draw a clock face showing 3:15 oclock, yes  Fall Risk Screen Fall Risk  10/05/2016 05/28/2016 05/20/2016 03/25/2015 06/26/2014  Falls in the past year? No Yes Yes No No  Number falls in past yr: - 1 1 - -  Injury with Fall? - Yes No - -  Risk Factor Category  - High Fall Risk - - -  Risk for fall due to : - History of fall(s);Impaired vision - History of  fall(s) -  Risk for fall due to (comments): - - - had one two years ago in our parking lot -  Follow up - Education provided;Falls prevention discussed - - -    Gait Assessment: Normal gait observed yes  Advanced directives Does patient have a [place]? No Does patient have a Living Will? No   Past Medical History:  Diagnosis Date  . Abnormal CT scan, sinus 10/04/06   pansinusitis, mildly deviated septum  . Allergy   . Anemia    related to CKD  . Chronic sinusitis   . Dysthymia    dysthymia  . H/O echocardiogram 01/04/2007   LV function normal, 55-60% EF, mildly increased thickenss of LV wall  . H/O echocardiogram 12/2006   mild LV wall thickening, EF 55-60%, LV function normal  . History of MRI of brain and brain stem 02/26/06   sinusitis, nonspecific deep white matter changes normal for age  . Hypertension   . Seborrheic keratosis    sees Toni Arthurs, NP dermatology  . Thrombocytosis [facility])    sees hematology    Past Surgical History:  Procedure Laterality Date  . HERNIA REPAIR     right inguinal repair  . TONSILLECTOMY     childhood  . VARICOSE VEIN SURGERY     right lower leg    Social History   Social History  . Marital status: Married    Spouse name: N/A  . Number of children: N/A  .  Years of education: N/A   Occupational History  .  [employer]    mowing yards, drives activity school bus   Social History Main Topics  . Smoking status: Former Smoker    Quit date: 06/14/1958  . Smokeless tobacco: Never Used  . Alcohol use No  . Drug use: No  . Sexual activity: Not on file   Other Topics Concern  . Not on file   Social History Narrative   Lives at home with wife and dog.  Wife is [name], is her primary caregiver.  Does some gardening.   Exercise with walking.  Former bus Geophysicist/field seismologist.  Daughter [name] works here at [employer]    Family History  Problem Relation Age of Onset  . Heart disease Father   . Stroke Father   . Cancer Neg Hx    . COPD Neg Hx   . Diabetes Neg Hx      Current Outpatient Prescriptions:  .  acetaminophen (TYLENOL) 500 MG tablet, Take 500 mg by mouth every 6 (six) hours as needed. Reported on 07/30/2015, Disp: , Rfl:  .  anagrelide (AGRYLIN) 0.5 MG capsule, Take 1 capsule (0.5 mg total) by mouth daily., Disp: 90 capsule, Rfl: 4 .  aspirin 81 MG tablet, Take 81 mg by mouth daily.  , Disp: , Rfl:  .  CVS VITAMIN D3 1000 units capsule, TAKE 1 TABLET (1,000 UNITS TOTAL) BY MOUTH DAILY., Disp: 90 capsule, Rfl: 3 .  FLUoxetine (PROZAC) 10 MG tablet, TAKE 1 TABLET EVERY DAY, Disp: 90 tablet, Rfl: 0 .  FLUoxetine (PROZAC) 40 MG capsule, Take 1 capsule (40 mg total) by mouth daily., Disp: 90 capsule, Rfl: 1 .  sodium chloride (OCEAN) 0.65 % nasal spray, Place 1 spray into the nose as needed., Disp: , Rfl:  .  terazosin (HYTRIN) 2 MG capsule, Take 1 capsule (2 mg total) by mouth daily., Disp: 90 capsule, Rfl: 1  Allergies  Allergen Reactions  . Erythromycin     History reviewed: allergies, current medications, past family history, past medical history, past social history, past surgical history and problem list  Chronic issues discussed: HTN - compliant with BP medication, usually gets 130s/60s.    Not feeling dizzy or lightheaded.     Has some incontinence, some hesitancy of urine.    Was having bowel problems few weeks ago, loose stools and some constipation, but improved now.  Acute issues discussed: none   Objective:     Biometrics BP 110/66   Pulse (!) 59   Ht 6\' 1"  (1.854 m)   Wt 164 lb 6.4 oz (74.6 kg)   SpO2 98%   BMI 21.69 kg/m   Wt Readings from Last 3 Encounters:  10/05/16 164 lb 6.4 oz (74.6 kg)  09/09/16 164 lb 9.6 oz (74.7 kg)  04/02/16 172 lb 9.6 oz (78.3 kg)   BP Readings from Last 3 Encounters:  10/05/16 110/66  09/09/16 124/70  04/02/16 126/62    Cognitive Testing  Alert? Yes  Normal Appearance?Yes  Oriented to person? Yes  Place? Yes   Time? Yes  Recall of  three objects?  no  Can perform simple calculations? Yes  Displays appropriate judgment?Yes, sometimes has some trouble keeping up with bills and remembering, wife has dementia, and he is her caregiver as well.     Can read the correct time from a watch face?Yes  General appearance: alert, no distress, WD/WN, white male  Nutritional Status: Inadequate calore intake? yes Loss of muscle  mass? yes Loss of fat beneath skin? yes Localized or general edema? no Diminished functional status? no  Other pertinent exam: HEENT: normocephalic, sclerae anicteric, TMs pearly, nares patent, no discharge or erythema, pharynx normal Oral cavity: MMM, no lesions Neck: supple, no lymphadenopathy, no thyromegaly, no masses, no bruits Heart: RRR, normal S1, S2, no murmurs Lungs: CTA bilaterally, no wheezes, rhonchi, or rales Abdomen: +bs, soft, non tender, non distended, no masses, no hepatomegaly, no splenomegaly Musculoskeletal: nontender, no swelling, no obvious deformity Extremities: no edema, no cyanosis, no clubbing Pulses: 1+ symmetric, upper and lower extremities, normal cap refill Neurological: CN2-12 intact, strength normal upper extremities and lower extremities, sensation normal throughout, DTRs 1+ throughout, no cerebellar signs, gait normal Psychiatric: normal affect, seems a little down, pleasant    Adult ECG Report  Indication: HTN  Rate: 61 bpm  Rhythm: sinus rhythm with PVC  QRS Axis: 7 degrees  PR Interval: 174ms  QRS Duration: 64ms  QTc: 466ms  Conduction Disturbances: none  Other Abnormalities: none  Patient's cardiac risk factors are: advanced age (older than 50 for men, 48 for women), hypertension and male gender.  EKG comparison: none  Narrative Interpretation: no acute changes   Assessment:   Encounter Diagnoses  Name Primary?  . Medicare annual wellness visit, subsequent Yes  . Encounter for health maintenance examination in adult   . Dysthymia   . Essential  thrombocytosis ([place])   . CKD (chronic kidney disease) stage 3, GFR 30-59 ml/min   . Benign prostatic hyperplasia, unspecified whether lower urinary tract symptoms present   . Essential hypertension   . Varicose veins of legs   . Hearing loss, unspecified hearing loss type, unspecified laterality   . Anemia in chronic kidney disease, unspecified CKD stage   . Seborrheic keratoses   . History of fall   . Microalbuminuria   . Mixed dyslipidemia   . Urge incontinence   . Vitamin D deficiency   . Vaccine counseling   . Macular degeneration, unspecified laterality, unspecified type   . Fatigue, unspecified type   . Memory loss   . Chronic pain of both knees      Plan:   A preventative services visit was completed today.  During the course of the visit today, we discussed and counseled about appropriate screening and preventive services.  A health risk assessment was established today that included a review of current medications, allergies, social history, family history, medical and preventative health history, biometrics, and preventative screenings to identify potential safety concerns or impairments.  A personalized plan was printed today for your records and use.   Personalized health advice and education was given today to reduce health risks and promote self management and wellness.  Information regarding end of life planning was discussed today.  Conditions/risks identified: Memory loss Weight loss Chronic knee pain, OA  Chronic problems discussed today: HTN - c/t same medications, labs and EKG today BPH - c/t Hytrin  Acute problems discussed today: Counseled on diet, food choices, and his children do help out with his and wife's care, and they do get hot meals provided by family throughout the week Monitor for further weight loss Consider Namenda or referral to neurology regarding memory loss  Recommendations:  I recommend a yearly ophthalmology/optometry visit for  glaucoma screening and eye checkup  I recommended a yearly dental visit for hygiene and checkup  Advanced directives - discussed nature and purpose of Advanced Directives, encouraged them to complete them if they have not done  so and/or encouraged them to get [place] a copy if they have done this already.  Referrals today: none  Immunizations: I recommended a yearly influenza vaccine, typically in September when the vaccine is usually available Is the Pneumococcal vaccine up to date: yes. Is the Shingles vaccine up to date: no.   Is the Td/Tdap vaccine up to date: yes.   Medicare Attestation A preventative services visit was completed today.  During the course of the visit the patient was educated and counseled about appropriate screening and preventive services.  A health risk assessment was established with the patient that included a review of current medications, allergies, social history, family history, medical and preventative health history, biometrics, and preventative screenings to identify potential safety concerns or impairments.  A personalized plan was printed today for the patient's records and use.   Personalized health advice and education was given today to reduce health risks and promote self management and wellness.  Information regarding end of life planning was discussed today.  Crisoforo Oxford, PA-C   10/05/2016

## 2016-10-05 NOTE — Patient Instructions (Addendum)
Recommendations  Vaccines  Get a yearly flu shot, typically in September  I recommend you have a shingles vaccine to help prevent shingles or herpes zoster outbreak.   Please call your insurer to inquire about coverage for the Shingrix vaccine given in 2 doses.   Some insurers cover this vaccine after age 81, some cover this after age 5.  If your insurer covers this, then call to schedule appointment to have this vaccine here.  Preventive care:  See your dentist yearly for routine dental care including hygiene visits twice yearly.  Continue your routine care with your eye doctor, specifically regarding macular degeneration  See [place] here yearly for well visit, 6 month check ups on medications  Try and get a variety of vegetables and fruits in the diet, whole grains.  Limit fast food and fried food  Consider easier meals such as salads, frozen health choice dinners for example  Get exercise such as walking, stretching, chair weights  Advanced Directives I strongly recommend you complete a [place], Living Will for health care future planning And update your Last Will and Testament if needed Let your family know you and your wife's wishes regarding health care needs if something were to happen in the future such as serious illness like pneumonia or other   Keep your mind active  Read books, read your bible daily, read the newspaper  Do crosswords or word searches   If you get to a point where you don't feel safe handling your personal finances or if you are overwhelmed with home responsible since you also take care of your wife, ask for help.  Its ok!    Fall Prevention in the Home Falls can cause injuries. They can happen to people of all ages. There are many things you can do to make your home safe and to help prevent falls. What can I do on the outside of my home?  Regularly fix the edges of walkways and driveways and fix any cracks.  Remove anything  that might make you trip as you walk through a door, such as a raised step or threshold.  Trim any bushes or trees on the path to your home.  Use bright outdoor lighting.  Clear any walking paths of anything that might make someone trip, such as rocks or tools.  Regularly check to see if handrails are loose or broken. Make sure that both sides of any steps have handrails.  Any raised decks and porches should have guardrails on the edges.  Have any leaves, snow, or ice cleared regularly.  Use sand or salt on walking paths during winter.  Clean up any spills in your garage right away. This includes oil or grease spills. What can I do in the bathroom?  Use night lights.  Install grab bars by the toilet and in the tub and shower. Do not use towel bars as grab bars.  Use non-skid mats or decals in the tub or shower.  If you need to sit down in the shower, use a plastic, non-slip stool.  Keep the floor dry. Clean up any water that spills on the floor as soon as it happens.  Remove soap buildup in the tub or shower regularly.  Attach bath mats securely with double-sided non-slip rug tape.  Do not have throw rugs and other things on the floor that can make you trip. What can I do in the bedroom?  Use night lights.  Make sure that you  have a light by your bed that is easy to reach.  Do not use any sheets or blankets that are too big for your bed. They should not hang down onto the floor.  Have a firm chair that has side arms. You can use this for support while you get dressed.  Do not have throw rugs and other things on the floor that can make you trip. What can I do in the kitchen?  Clean up any spills right away.  Avoid walking on wet floors.  Keep items that you use a lot in easy-to-reach places.  If you need to reach something above you, use a strong step stool that has a grab bar.  Keep electrical cords out of the way.  Do not use floor polish or wax that makes  floors slippery. If you must use wax, use non-skid floor wax.  Do not have throw rugs and other things on the floor that can make you trip. What can I do with my stairs?  Do not leave any items on the stairs.  Make sure that there are handrails on both sides of the stairs and use them. Fix handrails that are broken or loose. Make sure that handrails are as long as the stairways.  Check any carpeting to make sure that it is firmly attached to the stairs. Fix any carpet that is loose or worn.  Avoid having throw rugs at the top or bottom of the stairs. If you do have throw rugs, attach them to the floor with carpet tape.  Make sure that you have a light switch at the top of the stairs and the bottom of the stairs. If you do not have them, ask someone to add them for you. What else can I do to help prevent falls?  Wear shoes that:  Do not have high heels.  Have rubber bottoms.  Are comfortable and fit you well.  Are closed at the toe. Do not wear sandals.  If you use a stepladder:  Make sure that it is fully opened. Do not climb a closed stepladder.  Make sure that both sides of the stepladder are locked into place.  Ask someone to hold it for you, if possible.  Clearly mark and make sure that you can see:  Any grab bars or handrails.  First and last steps.  Where the edge of each step is.  Use tools that help you move around (mobility aids) if they are needed. These include:  Canes.  Walkers.  Scooters.  Crutches.  Turn on the lights when you go into a dark area. Replace any light bulbs as soon as they burn out.  Set up your furniture so you have a clear path. Avoid moving your furniture around.  If any of your floors are uneven, fix them.  If there are any pets around you, be aware of where they are.  Review your medicines with your doctor. Some medicines can make you feel dizzy. This can increase your chance of falling. Ask your doctor what other things  that you can do to help prevent falls. This information is not intended to replace advice given to you by your health care provider. Make sure you discuss any questions you have with your health care provider. Document Released: 03/27/2009 Document Revised: 11/06/2015 Document Reviewed: 07/05/2014 Elsevier Interactive Patient Education  2017 [place] Directive Advance directives are legal documents that let you make choices ahead of time about  your health care and medical treatment in case you become unable to communicate for yourself. Advance directives are a way for you to communicate your wishes to family, friends, and health care providers. This can help convey your decisions about end-of-life care if you become unable to communicate. Discussing and writing advance directives should happen over time rather than all at once. Advance directives can be changed depending on your situation and what you want, even after you have signed the advance directives. If you do not have an advance directive, some states assign family decision makers to act on your behalf based on how closely you are related to them. Each state has its own laws regarding advance directives. You may want to check with your health care provider, attorney, or state representative about the laws in your state. There are different types of advance directives, such as:  Medical power of attorney.  Living will.  Do not resuscitate (DNR) or do not attempt resuscitation (DNAR) order. Health care proxy and medical power of attorney A health care proxy, also called a health care agent, is a person who is appointed to make medical decisions for you in cases in which you are unable to make the decisions yourself. Generally, people choose someone they know well and trust to represent their preferences. Make sure to ask this person for an agreement to act as your proxy. A proxy may have to exercise judgment in the event of  a medical decision for which your wishes are not known. A medical power of attorney is a legal document that names your health care proxy. Depending on the laws in your state, after the document is written, it may also need to be:  Signed.  Notarized.  Dated.  Copied.  Witnessed.  Incorporated into your medical record. You may also want to appoint someone to manage your financial affairs in a situation in which you are unable to do so. This is called a durable power of attorney for finances. It is a separate legal document from the durable power of attorney for health care. You may choose the same person or someone different from your health care proxy to act as your agent in financial matters. If you do not appoint a proxy, or if there is a concern that the proxy is not acting in your best interests, a court-appointed guardian may be designated to act on your behalf. Living will A living will is a set of instructions documenting your wishes about medical care when you cannot express them yourself. Health care providers should keep a copy of your living will in your medical record. You may want to give a copy to family members or friends. To alert caregivers in case of an emergency, you can place a card in your wallet to let them know that you have a living will and where they can find it. A living will is used if you become:  Terminally ill.  Incapacitated.  Unable to communicate or make decisions. Items to consider in your living will include:  The use or non-use of life-sustaining equipment, such as dialysis machines and breathing machines (ventilators).  A DNR or DNAR order, which is the instruction not to use cardiopulmonary resuscitation (CPR) if breathing or heartbeat stops.  The use or non-use of tube feeding.  Withholding of food and fluids.  Comfort (palliative) care when the goal becomes comfort rather than a cure.  Organ and tissue donation. A living will does not  give instructions for  distributing your money and property if you should pass away. It is recommended that you seek the advice of a lawyer when writing a will. Decisions about taxes, beneficiaries, and asset distribution will be legally binding. This process can relieve your family and friends of any concerns surrounding disputes or questions that may come up about the distribution of your assets. DNR or DNAR A DNR or DNAR order is a request not to have CPR in the event that your heart stops beating or you stop breathing. If a DNR or DNAR order has not been made and shared, a health care provider will try to help any patient whose heart has stopped or who has stopped breathing. If you plan to have surgery, talk with your health care provider about how your DNR or DNAR order will be followed if problems occur. Summary  Advance directives are the legal documents that allow you to make choices ahead of time about your health care and medical treatment in case you become unable to communicate for yourself.  The process of discussing and writing advance directives should happen over time. You can change the advance directives, even after you have signed them.  Advance directives include DNR or DNAR orders, living wills, and designating an agent as your medical power of attorney. This information is not intended to replace advice given to you by your health care provider. Make sure you discuss any questions you have with your health care provider. Document Released: 09/07/2007 Document Revised: 04/19/2016 Document Reviewed: 04/19/2016 Elsevier Interactive Patient Education  2017 [employer].

## 2016-10-05 NOTE — Addendum Note (Signed)
Addended by: Carlena Hurl on: 10/05/2016 01:01 PM   Modules accepted: Orders, Level of Service

## 2016-10-06 ENCOUNTER — Other Ambulatory Visit: Payer: Self-pay | Admitting: Medical

## 2016-10-06 LAB — CBC WITH DIFFERENTIAL/PLATELET
BASOS ABS: 441 {cells}/uL — AB (ref 0–200)
Basophils Relative: 3 %
EOS ABS: 147 {cells}/uL (ref 15–500)
Eosinophils Relative: 1 %
HCT: 30.2 % — ABNORMAL LOW (ref 38.5–50.0)
HEMOGLOBIN: 9.5 g/dL — AB (ref 13.2–17.1)
Lymphocytes Relative: 21 %
Lymphs Abs: 3087 cells/uL (ref 850–3900)
MCH: 30.8 pg (ref 27.0–33.0)
MCHC: 31.5 g/dL — AB (ref 32.0–36.0)
MCV: 98.1 fL (ref 80.0–100.0)
MPV: 9.5 fL (ref 7.5–12.5)
Monocytes Absolute: 2646 cells/uL — ABNORMAL HIGH (ref 200–950)
Monocytes Relative: 18 %
NEUTROS PCT: 57 %
Neutro Abs: 8379 cells/uL — ABNORMAL HIGH (ref 1500–7800)
Platelets: 439 10*3/uL — ABNORMAL HIGH (ref 140–400)
RBC: 3.08 MIL/uL — ABNORMAL LOW (ref 4.20–5.80)
RDW: 18.6 % — ABNORMAL HIGH (ref 11.0–15.0)
WBC: 14.7 10*3/uL — ABNORMAL HIGH (ref 4.0–10.5)

## 2016-10-06 LAB — T4, FREE: Free T4: 1.1 ng/dL (ref 0.8–1.8)

## 2016-10-06 MED ORDER — ASPIRIN 81 MG PO TABS: 81.0000 mg | ORAL_TABLET | Freq: Every day | ORAL | 3 refills | Status: DC

## 2016-10-06 MED ORDER — VITAMIN D (ERGOCALCIFEROL) 1.25 MG (50000 UNIT) PO CAPS: 50000.0000 [IU] | ORAL_CAPSULE | ORAL | 3 refills | Status: DC

## 2016-10-06 MED ORDER — FLUOXETINE HCL 40 MG PO CAPS: 40.0000 mg | ORAL_CAPSULE | Freq: Every day | ORAL | 3 refills | Status: DC

## 2016-10-06 MED ORDER — FLUOXETINE HCL 10 MG PO TABS: 10.0000 mg | ORAL_TABLET | Freq: Every day | ORAL | 3 refills | Status: DC

## 2016-10-06 MED ORDER — TERAZOSIN HCL 2 MG PO CAPS: 2.0000 mg | ORAL_CAPSULE | Freq: Every day | ORAL | 3 refills | Status: DC

## 2016-10-08 DIAGNOSIS — H353221 Exudative age-related macular degeneration, left eye, with active choroidal neovascularization: Secondary | ICD-10-CM | POA: Diagnosis not present

## 2016-11-16 DIAGNOSIS — H353221 Exudative age-related macular degeneration, left eye, with active choroidal neovascularization: Secondary | ICD-10-CM | POA: Diagnosis not present

## 2016-11-24 ENCOUNTER — Telehealth: Payer: Self-pay | Admitting: Medical

## 2016-11-24 NOTE — Telephone Encounter (Signed)
Pt called and stated he is experiencing uncontrolled diarrhea. He is requesting something be sent in for him. He was offered an appt but he states he can't stay out of the bathroom long enough to come in. Pt uses [facility] [address] and can be reached at [phone] or can talk to daughter [name].

## 2016-11-24 NOTE — Telephone Encounter (Signed)
Forwarding to [employer]

## 2016-11-24 NOTE — Telephone Encounter (Signed)
How many days of this? Any blood in stool? Fever? Nausea or vomiting? How many loose stools per day? Has he been on recent antibiotic?  Main recommendation is to keep hydrated, water, clear fluids, maybe some G2 gatorade, soup. If no fever or blood in stool, can use 2-3 imodium daily if needed  Let me know about the above symptoms

## 2016-11-24 NOTE — Telephone Encounter (Signed)
Pt states it started last night, no blood, no fever, no vomiting or nausea. He has not been on a recent antibiotic. Since last night he has had to go around 5 or 6 times. Pt did states it has slacked off some. Pt was informed of recommendations.

## 2016-11-24 NOTE — Telephone Encounter (Signed)
Hydrate throughout the day with water.   Can use imodium 2-3 tablets daly.  If worsening or not seeing some improvement by tomorrow, call or come in. However, if not hydrating enough or feeling very weak, then come on in for eval.    If unsure or worried, then come in for appt

## 2016-11-25 ENCOUNTER — Encounter: Payer: Self-pay | Admitting: Medical

## 2016-11-25 ENCOUNTER — Other Ambulatory Visit (HOSPITAL_BASED_OUTPATIENT_CLINIC_OR_DEPARTMENT_OTHER): Payer: Medicare Other

## 2016-11-25 ENCOUNTER — Ambulatory Visit (INDEPENDENT_AMBULATORY_CARE_PROVIDER_SITE_OTHER): Payer: Medicare Other | Admitting: Medical

## 2016-11-25 VITALS — BP 110/52 | HR 87 | Temp 98.0°F | Wt 162.2 lb

## 2016-11-25 DIAGNOSIS — M7989 Other specified soft tissue disorders: Secondary | ICD-10-CM | POA: Diagnosis not present

## 2016-11-25 DIAGNOSIS — L03119 Cellulitis of unspecified part of limb: Secondary | ICD-10-CM

## 2016-11-25 DIAGNOSIS — R197 Diarrhea, unspecified: Secondary | ICD-10-CM

## 2016-11-25 DIAGNOSIS — R011 Cardiac murmur, unspecified: Secondary | ICD-10-CM | POA: Diagnosis not present

## 2016-11-25 DIAGNOSIS — D473 Essential (hemorrhagic) thrombocythemia: Secondary | ICD-10-CM | POA: Diagnosis present

## 2016-11-25 LAB — COMPREHENSIVE METABOLIC PANEL
ALT: 11 U/L (ref 0–55)
ANION GAP: 9 meq/L (ref 3–11)
AST: 21 U/L (ref 5–34)
Albumin: 3.4 g/dL — ABNORMAL LOW (ref 3.5–5.0)
Alkaline Phosphatase: 96 U/L (ref 40–150)
BILIRUBIN TOTAL: 0.49 mg/dL (ref 0.20–1.20)
BUN: 22.1 mg/dL (ref 7.0–26.0)
CO2: 24 meq/L (ref 22–29)
CREATININE: 1.6 mg/dL — AB (ref 0.7–1.3)
Calcium: 8.9 mg/dL (ref 8.4–10.4)
Chloride: 109 mEq/L (ref 98–109)
EGFR: 39 mL/min/{1.73_m2} — ABNORMAL LOW (ref >90)
Glucose: 114 mg/dl (ref 70–140)
Potassium: 4.2 mEq/L (ref 3.5–5.1)
Sodium: 142 mEq/L (ref 136–145)
TOTAL PROTEIN: 6.8 g/dL (ref 6.4–8.3)

## 2016-11-25 LAB — CBC WITH DIFFERENTIAL/PLATELET
BASO%: 3.1 % — AB (ref 0.0–2.0)
Basophils Absolute: 0.6 10*3/uL — ABNORMAL HIGH (ref 0.0–0.1)
EOS ABS: 0 10*3/uL (ref 0.0–0.5)
EOS%: 0 % (ref 0.0–7.0)
HEMATOCRIT: 29.3 % — AB (ref 38.4–49.9)
HGB: 9 g/dL — ABNORMAL LOW (ref 13.0–17.1)
LYMPH%: 16.5 % (ref 14.0–49.0)
MCH: 30.9 pg (ref 27.2–33.4)
MCHC: 30.7 g/dL — ABNORMAL LOW (ref 32.0–36.0)
MCV: 100.7 fL — ABNORMAL HIGH (ref 79.3–98.0)
MONO#: 2.6 10*3/uL — AB (ref 0.1–0.9)
MONO%: 14.3 % — AB (ref 0.0–14.0)
NEUT%: 66.1 % (ref 39.0–75.0)
NEUTROS ABS: 12.2 10*3/uL — AB (ref 1.5–6.5)
NRBC: 1 % — AB (ref 0–0)
Platelets: 437 10*3/uL — ABNORMAL HIGH (ref 140–400)
RBC: 2.91 10*6/uL — AB (ref 4.20–5.82)
RDW: 19.5 % — AB (ref 11.0–14.6)
WBC: 18.5 10*3/uL — AB (ref 4.0–10.3)
lymph#: 3 10*3/uL (ref 0.9–3.3)

## 2016-11-25 LAB — TECHNOLOGIST REVIEW

## 2016-11-25 MED ORDER — CEPHALEXIN 500 MG PO CAPS: 500.0000 mg | ORAL_CAPSULE | Freq: Three times a day (TID) | ORAL | 0 refills | Status: DC

## 2016-11-25 MED ORDER — SILVER SULFADIAZINE 1 % EX CREA: 1.0000 "application " | TOPICAL_CREAM | Freq: Every day | CUTANEOUS | 0 refills | Status: DC

## 2016-11-25 NOTE — Patient Instructions (Addendum)
Recommendations: Ulcer/wound of left foot  Keep the wound clean with soap and water  Change out the bandage twice daily and use a layer of Silvadene cream topically to the wound  Elevate the leg for 20 minutes when possible  Begin oral Keflex antibiotic 3 times daily  Diarrhea/dehydration  You can continue Imodium OTC 2-3 tablets daily as needed  If you develop fever or blood in the stool then call us, but stop the Imodium  Hydrate throughout the day with water, some milk, some juice   You can use soup, crackers, and other bland foods  The diarrhea should resolve in a day or 2  If the diarrhea persists, then we can consider checking stool labs  Murmur  There is a new murmur today but I think its related to you being a little dehydrated and the early foot infection  Hydrate well  continue your same medications  But lets see you back in 7-10 days to recheck the heart sounds  If you start feeling short of breath, chest pain, worse swelling including both legs, or extreme fatigue, then recheck right away

## 2016-11-25 NOTE — Telephone Encounter (Signed)
Ok

## 2016-11-25 NOTE — Progress Notes (Signed)
Subjective: Chief Complaint  Patient presents with  . ulcer on heels ,    ulcer on left heel, x 3 days and diherra    Here for diarrhea and foot wound. Accompanied by daughter [name] today  He called in 2 days ago with c/o diarrhea 5-6 times daily.  We gave some advice on the phone.   He notes diarrhea has calmed down a lot in the last 12 hours.  Using a few imodium. No fever, no blood in stool, no sick conatcat, no recent antibiotic use, no recent hospitalization, no recent travel.   Is trying to hydrate.   Drinks a lot of orange juice, drinks maybe 2 cups milk daily, some water.     2 days ago had blister of left foot behind malleolus, popped it.  It drainage but not has pus drainage and foot swelling without warmth or redness.  No other aggravating or relieving factors. No other complaint.  Past Medical History:  Diagnosis Date  . Abnormal CT scan, sinus 10/04/06   pansinusitis, mildly deviated septum  . Allergy   . Anemia    related to CKD  . Chronic sinusitis   . Dysthymia    dysthymia  . H/O echocardiogram 01/04/2007   LV function normal, 55-60% EF, mildly increased thickenss of LV wall  . H/O echocardiogram 12/2006   mild LV wall thickening, EF 55-60%, LV function normal  . History of MRI of brain and brain stem 02/26/06   sinusitis, nonspecific deep white matter changes normal for age  . Hypertension   . Seborrheic keratosis    sees Toni Arthurs, NP dermatology  . Thrombocytosis [facility])    sees hematology   Current Outpatient Prescriptions on File Prior to Visit  Medication Sig Dispense Refill  . acetaminophen (TYLENOL) 500 MG tablet Take 500 mg by mouth every 6 (six) hours as needed. Reported on 07/30/2015    . anagrelide (AGRYLIN) 0.5 MG capsule Take 1 capsule (0.5 mg total) by mouth daily. 90 capsule 4  . aspirin 81 MG tablet Take 1 tablet (81 mg total) by mouth daily. 90 tablet 3  . [facility] VITAMIN D3 1000 units capsule TAKE 1 TABLET (1,000 UNITS TOTAL) BY MOUTH DAILY. 90  capsule 3  . FLUoxetine (PROZAC) 10 MG tablet Take 1 tablet (10 mg total) by mouth daily. 90 tablet 3  . FLUoxetine (PROZAC) 40 MG capsule Take 1 capsule (40 mg total) by mouth daily. 90 capsule 3  . sodium chloride (OCEAN) 0.65 % nasal spray Place 1 spray into the nose as needed.    . terazosin (HYTRIN) 2 MG capsule Take 1 capsule (2 mg total) by mouth daily. 90 capsule 3  . Vitamin D, Ergocalciferol, (DRISDOL) 50000 units CAPS capsule Take 1 capsule (50,000 Units total) by mouth every 7 (seven) days. 12 capsule 3   No current facility-administered medications on file prior to visit.    ROS as in subjective   Objective: BP (!) 110/52   Pulse 87   Temp 98 F (36.7 C)   Wt 162 lb 3.2 oz (73.6 kg)   SpO2 96%   BMI 21.40 kg/m   General appearance: alert, no distress, WD/WN,  Oral cavity: MMM, no lesions Neck: supple, no lymphadenopathy, no thyromegaly, no masses, no JVD Heart: new 2/6 holosystolic murmur heard best in upper sternal borders, otherwise RRR, normal S1, S2 Lungs: CTA bilaterally, no wheezes, rhonchi, or rales Abdomen: +bs, soft, non tender, non distended, no masses, no hepatomegaly, no splenomegaly Pulses:  2+ symmetric, upper and lower extremities, normal cap refill Skin: left foot just inferior and postero to malleolus with 2cm area of ulceration with surrounding skin from bullous tissue, some purulent drainage Left foot with generalized mild swelling without warmth or erythema Feet otherwise neurovascularly intact, no other foot lesions No lower leg or calve swelling    Assessment: Encounter Diagnoses  Name Primary?  . Diarrhea, unspecified type Yes  . Cellulitis of foot   . Foot swelling   . Heart murmur      Plan: Discussed systems and findings with him and daughter [name] We cleaned wound and put new bandage on today recommendations as below  Patient Instructions  Recommendations: Ulcer/wound of left foot  Keep the wound clean with soap and  water  Change out the bandage twice daily and use a layer of Silvadene cream topically to the wound  Elevate the leg for 20 minutes when possible  Begin oral Keflex antibiotic 3 times daily  Diarrhea/dehydration  You can continue Imodium OTC 2-3 tablets daily as needed  If you develop fever or blood in the stool then call us, but stop the Imodium  Hydrate throughout the day with water, some milk, some juice   You can use soup, crackers, and other bland foods  The diarrhea should resolve in a day or 2  If the diarrhea persists, then we can consider checking stool labs  Murmur  There is a new murmur today but I think its related to you being a little dehydrated and the early foot infection  Hydrate well  continue your same medications  But lets see you back in 7-10 days to recheck the heart sounds  If you start feeling short of breath, chest pain, worse swelling including both legs, or extreme fatigue, then recheck right away      Yani was seen today for ulcer on heels ,.  Diagnoses and all orders for this visit:  Diarrhea, unspecified type  Cellulitis of foot  Foot swelling  Heart murmur  Other orders -     silver sulfADIAZINE (SILVADENE) 1 % cream; Apply 1 application topically daily. -     cephALEXin (KEFLEX) 500 MG capsule; Take 1 capsule (500 mg total) by mouth 3 (three) times daily.

## 2016-11-25 NOTE — Telephone Encounter (Signed)
Pt was seen  For an office visit

## 2016-12-02 ENCOUNTER — Telehealth: Payer: Self-pay | Admitting: Oncology

## 2016-12-02 ENCOUNTER — Ambulatory Visit (HOSPITAL_BASED_OUTPATIENT_CLINIC_OR_DEPARTMENT_OTHER): Payer: Medicare Other | Admitting: Oncology

## 2016-12-02 VITALS — BP 130/56 | HR 71 | Temp 98.9°F | Resp 18 | Ht 73.0 in | Wt 162.6 lb

## 2016-12-02 DIAGNOSIS — D473 Essential (hemorrhagic) thrombocythemia: Secondary | ICD-10-CM

## 2016-12-02 DIAGNOSIS — D649 Anemia, unspecified: Secondary | ICD-10-CM

## 2016-12-02 MED ORDER — ANAGRELIDE HCL 0.5 MG PO CAPS: 0.5000 mg | ORAL_CAPSULE | Freq: Every day | ORAL | 4 refills | Status: DC

## 2016-12-02 NOTE — Telephone Encounter (Signed)
Scheduled appt per 6/21 los - gave patient AVS and calender per LOS.

## 2016-12-02 NOTE — Progress Notes (Signed)
ID: Nicholas [name]   DOB: 05-May-1928  MR#: [id number]  WVP#:[id number]  PCP: Denita Lung, MD GYN: SU:  OTHER MD:  CHIEF COMPLAINT:  Essential thrombocytosis and chronic anemia  CURRENT TREATMENT: Anagrelide, ASA  INTERVAL HISTORY: Nicholas [name] returns today for follow-up and treatment of his myeloproliferative syndrome, mainly essential thrombocytosis. He takes anagrelide 0.5 mg daily, which controls his platelet count without causing him any side effects that he is aware of. He is currently obtaining the drug at approximately $10 per month.    REVIEW OF SYSTEMS: Nicholas [name] is the primary caregiver for his wife and he is also very worried about his daughter, who he says has multiple problems and depends on him. He is having macular degeneration issues although he is still able to drive. Sometimes he has foot swelling. Overall though he keeps a good functional status for someone his age. A detailed review of systems was otherwise stable  PAST MEDICAL HISTORY: Past Medical History:  Diagnosis Date  . Abnormal CT scan, sinus 10/04/06   pansinusitis, mildly deviated septum  . Allergy   . Anemia    related to CKD  . Chronic sinusitis   . Dysthymia    dysthymia  . H/O echocardiogram 01/04/2007   LV function normal, 55-60% EF, mildly increased thickenss of LV wall  . H/O echocardiogram 12/2006   mild LV wall thickening, EF 55-60%, LV function normal  . History of MRI of brain and brain stem 02/26/06   sinusitis, nonspecific deep white matter changes normal for age  . Hypertension   . Seborrheic keratosis    sees Toni Arthurs, NP dermatology  . Thrombocytosis ([place])    sees hematology    PAST SURGICAL HISTORY: Past Surgical History:  Procedure Laterality Date  . HERNIA REPAIR     right inguinal repair  . TONSILLECTOMY     childhood  . VARICOSE VEIN SURGERY     right lower leg    FAMILY HISTORY Family History  Problem Relation Age of Onset  . Heart disease Father   . Stroke  Father   . Cancer Neg Hx   . COPD Neg Hx   . Diabetes Neg Hx     SOCIAL HISTORY:  (Reviewed 11/28/2013) The patient lives with his wife, who suffers from diabetes and emphysema and has had some small strokes. She is able to care for herself. He gives her her insulin shots. He has 2 daughters, Nicholas [name] [place] and [name] [facility]. He has 3 grandchildren.[name]    ADVANCED DIRECTIVES: the patient tells me his daughter Nicholas [name] would make health care decisions if he is incapacitated   HEALTH MAINTENANCE: (Updated 11/28/2013) Social History  Substance Use Topics  . Smoking status: Former Smoker    Quit date: 06/14/1958  . Smokeless tobacco: Never Used  . Alcohol use No     Colonoscopy:  Not on file  PSA: Not on file  Bone density: Never  Lipid panel: October 2014  Allergies  Allergen Reactions  . Erythromycin     Current Outpatient Prescriptions  Medication Sig Dispense Refill  . acetaminophen (TYLENOL) 500 MG tablet Take 500 mg by mouth every 6 (six) hours as needed. Reported on 07/30/2015    . anagrelide (AGRYLIN) 0.5 MG capsule Take 1 capsule (0.5 mg total) by mouth daily. 90 capsule 4  . aspirin 81 MG tablet Take 1 tablet (81 mg total) by mouth daily. 90 tablet 3  .  [employer] VITAMIN D3 1000 units capsule TAKE 1 TABLET (1,000 UNITS TOTAL) BY MOUTH DAILY. 90 capsule 3  . FLUoxetine (PROZAC) 10 MG tablet Take 1 tablet (10 mg total) by mouth daily. 90 tablet 3  . FLUoxetine (PROZAC) 40 MG capsule Take 1 capsule (40 mg total) by mouth daily. 90 capsule 3  . sodium chloride (OCEAN) 0.65 % nasal spray Place 1 spray into the nose as needed.    . terazosin (HYTRIN) 2 MG capsule Take 1 capsule (2 mg total) by mouth daily. 90 capsule 3   No current facility-administered medications for this visit.     OBJECTIVE: Elderly white [name] Who appears stated age  36:   12/02/16 1511  BP: (!) 130/56  Pulse: 71  Resp: 18  Temp: 98.9 F (37.2 C)      Body mass index is 21.45 kg/m.    ECOG FS: 1 Filed Weights   12/02/16 1511  Weight: 162 lb 9.6 oz (73.8 kg)   Sclerae unicteric, EOMs intact Oropharynx clear, slightly dry No cervical or supraclavicular adenopathy Lungs no rales or rhonchi Heart regular rate and rhythm Abd soft, nontender, positive bowel sounds MSK no focal spinal tenderness Neuro: nonfocal, well oriented, appropriate affect     LAB RESULTS:   Lab Results  Component Value Date   WBC 18.5 (H) 11/25/2016   NEUTROABS 12.2 (H) 11/25/2016   HGB 9.0 (L) 11/25/2016   HCT 29.3 (L) 11/25/2016   MCV 100.7 (H) 11/25/2016   PLT 437 (H) 11/25/2016      Chemistry      Component Value Date/Time   NA 142 11/25/2016 1349   K 4.2 11/25/2016 1349   CL 105 10/05/2016 0948   CO2 24 11/25/2016 1349   BUN 22.1 11/25/2016 1349   CREATININE 1.6 (H) 11/25/2016 1349      Component Value Date/Time   CALCIUM 8.9 11/25/2016 1349   ALKPHOS 96 11/25/2016 1349   AST 21 11/25/2016 1349   ALT 11 11/25/2016 1349   BILITOT 0.49 11/25/2016 1349       STUDIES: No results found.  ASSESSMENT: 83 y.o.  Nicholas [name] with   (1)  a history of essential thrombocytosis initially diagnosed December 2003,  controlled on anagrelide 0.5 mg daily and aspirin 81 mg daily.   (a) no evidence of malignant transformation  (2) anemia, likely secondary to chronic kidney disease   PLAN: Nicholas [name] is now a little over 14 years out from diagnosis of a myeloproliferative syndrome, primarily essential thrombocytosis. We are controlling his platelet count with anagrelide and he is also on aspirin for anticoagulation. He has had no problems from the disease or its treatment to date.  We will continue to monitor his lab work every 3 months and he will see me again in one year. He knows to call for any problems that may develop before the next visit here.  Nicholas Cruel, MD     12/04/2016

## 2016-12-06 ENCOUNTER — Telehealth: Payer: Self-pay | Admitting: Medical

## 2016-12-06 ENCOUNTER — Other Ambulatory Visit: Payer: Self-pay | Admitting: Medical

## 2016-12-06 MED ORDER — FLUOXETINE HCL 40 MG PO CAPS: 40.0000 mg | ORAL_CAPSULE | Freq: Every day | ORAL | 3 refills | Status: DC

## 2016-12-06 MED ORDER — FLUOXETINE HCL 10 MG PO TABS: 10.0000 mg | ORAL_TABLET | Freq: Every day | ORAL | 3 refills | Status: DC

## 2016-12-06 NOTE — Telephone Encounter (Signed)
Pt called for refills of Fluoxetine 10mg  and 40mg . Please send to [facility].

## 2016-12-21 DIAGNOSIS — H353221 Exudative age-related macular degeneration, left eye, with active choroidal neovascularization: Secondary | ICD-10-CM | POA: Diagnosis not present

## 2017-01-25 DIAGNOSIS — H353221 Exudative age-related macular degeneration, left eye, with active choroidal neovascularization: Secondary | ICD-10-CM | POA: Diagnosis not present

## 2017-01-26 ENCOUNTER — Encounter: Payer: Self-pay | Admitting: Medical

## 2017-01-26 ENCOUNTER — Ambulatory Visit (INDEPENDENT_AMBULATORY_CARE_PROVIDER_SITE_OTHER): Payer: Medicare Other | Admitting: Medical

## 2017-01-26 VITALS — BP 95/54 | HR 80 | Wt 159.4 lb

## 2017-01-26 DIAGNOSIS — D692 Other nonthrombocytopenic purpura: Secondary | ICD-10-CM | POA: Diagnosis not present

## 2017-01-26 DIAGNOSIS — R195 Other fecal abnormalities: Secondary | ICD-10-CM | POA: Insufficient documentation

## 2017-01-26 DIAGNOSIS — S51012A Laceration without foreign body of left elbow, initial encounter: Secondary | ICD-10-CM | POA: Insufficient documentation

## 2017-01-26 DIAGNOSIS — L989 Disorder of the skin and subcutaneous tissue, unspecified: Secondary | ICD-10-CM

## 2017-01-26 DIAGNOSIS — I10 Essential (primary) hypertension: Secondary | ICD-10-CM | POA: Diagnosis not present

## 2017-01-26 NOTE — Progress Notes (Signed)
Subjective: Chief Complaint  Patient presents with  . wound on his heel and arm    wound on his heel and arm that will not heal    Here for 2 skin issues .  He notes left forearm growth for the past month, yellow thickened.  He notes left ankle crusted scab like lesion for over a moth, nonhealing wound.  He reports seeing me for wound in this area months ago, but not the skin issue won't resolve  He feels fine otherwise, but given low BPs, he reports having 2-3 days of loose stools this week, none today and afternoon yesterday.  Not checking BPs.   Compliant with hytrin  Past Medical History:  Diagnosis Date  . Abnormal CT scan, sinus 10/04/06   pansinusitis, mildly deviated septum  . Allergy   . Anemia    related to CKD  . Chronic sinusitis   . Dysthymia    dysthymia  . H/O echocardiogram 01/04/2007   LV function normal, 55-60% EF, mildly increased thickenss of LV wall  . H/O echocardiogram 12/2006   mild LV wall thickening, EF 55-60%, LV function normal  . History of MRI of brain and brain stem 02/26/06   sinusitis, nonspecific deep white matter changes normal for age  . Hypertension   . Seborrheic keratosis    sees Toni Arthurs, NP dermatology  . Thrombocytosis [facility])    sees hematology   Current Outpatient Prescriptions on File Prior to Visit  Medication Sig Dispense Refill  . acetaminophen (TYLENOL) 500 MG tablet Take 500 mg by mouth every 6 (six) hours as needed. Reported on 07/30/2015    . anagrelide (AGRYLIN) 0.5 MG capsule Take 1 capsule (0.5 mg total) by mouth daily. 90 capsule 4  . aspirin 81 MG tablet Take 1 tablet (81 mg total) by mouth daily. 90 tablet 3  . CVS VITAMIN D3 1000 units capsule TAKE 1 TABLET (1,000 UNITS TOTAL) BY MOUTH DAILY. 90 capsule 3  . FLUoxetine (PROZAC) 10 MG tablet Take 1 tablet (10 mg total) by mouth daily. 90 tablet 3  . FLUoxetine (PROZAC) 40 MG capsule Take 1 capsule (40 mg total) by mouth daily. 90 capsule 3  . sodium chloride (OCEAN) 0.65  % nasal spray Place 1 spray into the nose as needed.    . terazosin (HYTRIN) 2 MG capsule Take 1 capsule (2 mg total) by mouth daily. 90 capsule 3   No current facility-administered medications on file prior to visit.    ROS as in subjective   Objective: BP (!) 95/54   Pulse 80   Wt 159 lb 6.4 oz (72.3 kg)   SpO2 95%   BMI 21.03 kg/m   Gen: wd, wn, nad Skin: several purplish ecchymosis of forearm and thin skin c/w senile purpura, left mid forearm posterior surface with horn like yellow thick lesion raised to a cone, roughly 20mm height x 1cm base.   Left ankle posteriorly and laterally with 2cm raised crusted lesion Heart rrr, normal s1, s2, no murmurs Lungs clear Ext: no edema non focal exam    Assessment: Encounter Diagnoses  Name Primary?  . Skin lesions Yes  . Loose stools   . Essential hypertension   . Senile purpura ([facility])     Plan: Skin lesions - refer to dermatology for further management  Loose stools - resolved  HTN - hold Hytrin for 2 days, then restart.  His hypotension today likely due to recent loose stools, viral gastroenteritis   Booker was  seen today for wound on his heel and arm.  Diagnoses and all orders for this visit:  Skin lesions  Loose stools  Essential hypertension  Senile purpura ([place])

## 2017-01-26 NOTE — Patient Instructions (Addendum)
Recommendations  We will refer you to dermatology for the skin lesions  For the next 2 days, STOP Hytrin/Terazosin, blood pressure medication  Restart the Hytrin blood pressure medication on Saturday  Make sure you are drinking plenty of water

## 2017-02-01 ENCOUNTER — Ambulatory Visit (INDEPENDENT_AMBULATORY_CARE_PROVIDER_SITE_OTHER): Payer: Medicare Other | Admitting: Family Medicine

## 2017-02-01 VITALS — BP 106/54 | HR 76 | Temp 98.1°F | Wt 160.0 lb

## 2017-02-01 DIAGNOSIS — R197 Diarrhea, unspecified: Secondary | ICD-10-CM | POA: Diagnosis not present

## 2017-02-01 DIAGNOSIS — R634 Abnormal weight loss: Secondary | ICD-10-CM

## 2017-02-01 DIAGNOSIS — Z630 Problems in relationship with spouse or partner: Secondary | ICD-10-CM

## 2017-02-01 LAB — CBC WITH DIFFERENTIAL/PLATELET
Basophils Absolute: 150 cells/uL (ref 0–200)
Basophils Relative: 1 %
EOS ABS: 0 {cells}/uL — AB (ref 15–500)
EOS PCT: 0 %
HCT: 28.3 % — ABNORMAL LOW (ref 38.5–50.0)
HEMOGLOBIN: 9.1 g/dL — AB (ref 13.2–17.1)
LYMPHS ABS: 2400 {cells}/uL (ref 850–3900)
Lymphocytes Relative: 16 %
MCH: 31.9 pg (ref 27.0–33.0)
MCHC: 32.2 g/dL (ref 32.0–36.0)
MCV: 99.3 fL (ref 80.0–100.0)
MPV: 9.4 fL (ref 7.5–12.5)
Monocytes Absolute: 750 cells/uL (ref 200–950)
Monocytes Relative: 5 %
NEUTROS PCT: 78 % — AB
Neutro Abs: 11700 cells/uL — ABNORMAL HIGH (ref 1500–7800)
Platelets: 385 10*3/uL (ref 140–400)
RBC: 2.85 MIL/uL — ABNORMAL LOW (ref 4.20–5.80)
RDW: 18.2 % — ABNORMAL HIGH (ref 11.0–15.0)
WBC: 15 10*3/uL — ABNORMAL HIGH (ref 4.0–10.5)
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
## 2017-02-02 ENCOUNTER — Ambulatory Visit: Payer: Medicare Other | Admitting: Family Medicine

## 2017-02-02 ENCOUNTER — Other Ambulatory Visit: Payer: Self-pay | Admitting: Medical

## 2017-02-02 LAB — COMPREHENSIVE METABOLIC PANEL
ALBUMIN: 3.8 g/dL (ref 3.6–5.1)
ALK PHOS: 87 U/L (ref 40–115)
ALT: 12 U/L (ref 9–46)
AST: 17 U/L (ref 10–35)
BILIRUBIN TOTAL: 0.4 mg/dL (ref 0.2–1.2)
BUN: 26 mg/dL — ABNORMAL HIGH (ref 7–25)
CALCIUM: 8.6 mg/dL (ref 8.6–10.3)
CHLORIDE: 108 mmol/L (ref 98–110)
CO2: 21 mmol/L (ref 20–32)
Creat: 1.64 mg/dL — ABNORMAL HIGH (ref 0.70–1.11)
Glucose, Bld: 106 mg/dL — ABNORMAL HIGH (ref 65–99)
POTASSIUM: 4.2 mmol/L (ref 3.5–5.3)
SODIUM: 140 mmol/L (ref 135–146)
TOTAL PROTEIN: 6.4 g/dL (ref 6.1–8.1)

## 2017-02-23 DIAGNOSIS — H353221 Exudative age-related macular degeneration, left eye, with active choroidal neovascularization: Secondary | ICD-10-CM | POA: Diagnosis not present

## 2017-02-25 ENCOUNTER — Encounter: Payer: Self-pay | Admitting: Family Medicine

## 2017-03-02 DIAGNOSIS — D0462 Carcinoma in situ of skin of left upper limb, including shoulder: Secondary | ICD-10-CM | POA: Diagnosis not present

## 2017-03-02 DIAGNOSIS — D485 Neoplasm of uncertain behavior of skin: Secondary | ICD-10-CM | POA: Diagnosis not present

## 2017-03-02 DIAGNOSIS — L859 Epidermal thickening, unspecified: Secondary | ICD-10-CM | POA: Diagnosis not present

## 2017-03-04 ENCOUNTER — Other Ambulatory Visit (HOSPITAL_BASED_OUTPATIENT_CLINIC_OR_DEPARTMENT_OTHER): Payer: Medicare Other

## 2017-03-04 DIAGNOSIS — D473 Essential (hemorrhagic) thrombocythemia: Secondary | ICD-10-CM | POA: Diagnosis present

## 2017-03-04 LAB — COMPREHENSIVE METABOLIC PANEL
ALT: 13 U/L (ref 0–55)
ANION GAP: 8 meq/L (ref 3–11)
AST: 20 U/L (ref 5–34)
Albumin: 3.5 g/dL (ref 3.5–5.0)
Alkaline Phosphatase: 73 U/L (ref 40–150)
BUN: 23.9 mg/dL (ref 7.0–26.0)
CO2: 25 meq/L (ref 22–29)
CREATININE: 1.6 mg/dL — AB (ref 0.7–1.3)
Calcium: 8.9 mg/dL (ref 8.4–10.4)
Chloride: 109 mEq/L (ref 98–109)
EGFR: 39 mL/min/{1.73_m2} — ABNORMAL LOW (ref >90)
Glucose: 90 mg/dl (ref 70–140)
Potassium: 4.2 mEq/L (ref 3.5–5.1)
SODIUM: 142 meq/L (ref 136–145)
TOTAL PROTEIN: 6.8 g/dL (ref 6.4–8.3)
Total Bilirubin: 0.52 mg/dL (ref 0.20–1.20)

## 2017-03-04 LAB — CBC WITH DIFFERENTIAL/PLATELET
BASO%: 1.3 % (ref 0.0–2.0)
Basophils Absolute: 0.2 10*3/uL — ABNORMAL HIGH (ref 0.0–0.1)
EOS%: 1.1 % (ref 0.0–7.0)
Eosinophils Absolute: 0.2 10*3/uL (ref 0.0–0.5)
HCT: 29 % — ABNORMAL LOW (ref 38.4–49.9)
HGB: 9.5 g/dL — ABNORMAL LOW (ref 13.0–17.1)
LYMPH%: 16.6 % (ref 14.0–49.0)
MCH: 31.7 pg (ref 27.2–33.4)
MCHC: 32.6 g/dL (ref 32.0–36.0)
MCV: 97.4 fL (ref 79.3–98.0)
MONO#: 1.5 10*3/uL — AB (ref 0.1–0.9)
MONO%: 9.9 % (ref 0.0–14.0)
NEUT%: 71.1 % (ref 39.0–75.0)
NEUTROS ABS: 10.4 10*3/uL — AB (ref 1.5–6.5)
Platelets: 367 10*3/uL (ref 140–400)
RBC: 2.98 10*6/uL — AB (ref 4.20–5.82)
RDW: 19.4 % — ABNORMAL HIGH (ref 11.0–14.6)
WBC: 14.7 10*3/uL — AB (ref 4.0–10.3)
lymph#: 2.4 10*3/uL (ref 0.9–3.3)

## 2017-03-04 LAB — TECHNOLOGIST REVIEW

## 2017-03-30 DIAGNOSIS — H353221 Exudative age-related macular degeneration, left eye, with active choroidal neovascularization: Secondary | ICD-10-CM | POA: Diagnosis not present

## 2017-04-07 DIAGNOSIS — L905 Scar conditions and fibrosis of skin: Secondary | ICD-10-CM | POA: Diagnosis not present

## 2017-04-07 DIAGNOSIS — D0462 Carcinoma in situ of skin of left upper limb, including shoulder: Secondary | ICD-10-CM | POA: Diagnosis not present

## 2017-04-20 ENCOUNTER — Other Ambulatory Visit (INDEPENDENT_AMBULATORY_CARE_PROVIDER_SITE_OTHER): Payer: Medicare Other

## 2017-04-20 DIAGNOSIS — Z23 Encounter for immunization: Secondary | ICD-10-CM | POA: Diagnosis not present

## 2017-05-09 DIAGNOSIS — H353221 Exudative age-related macular degeneration, left eye, with active choroidal neovascularization: Secondary | ICD-10-CM | POA: Diagnosis not present

## 2017-05-30 DIAGNOSIS — H35363 Drusen (degenerative) of macula, bilateral: Secondary | ICD-10-CM | POA: Diagnosis not present

## 2017-05-30 DIAGNOSIS — H353111 Nonexudative age-related macular degeneration, right eye, early dry stage: Secondary | ICD-10-CM | POA: Diagnosis not present

## 2017-05-30 DIAGNOSIS — H35722 Serous detachment of retinal pigment epithelium, left eye: Secondary | ICD-10-CM | POA: Diagnosis not present

## 2017-05-30 DIAGNOSIS — H2513 Age-related nuclear cataract, bilateral: Secondary | ICD-10-CM | POA: Diagnosis not present

## 2017-05-30 DIAGNOSIS — H353221 Exudative age-related macular degeneration, left eye, with active choroidal neovascularization: Secondary | ICD-10-CM | POA: Diagnosis not present

## 2017-06-03 ENCOUNTER — Telehealth: Payer: Self-pay | Admitting: Medical

## 2017-06-03 ENCOUNTER — Other Ambulatory Visit (HOSPITAL_BASED_OUTPATIENT_CLINIC_OR_DEPARTMENT_OTHER): Payer: Medicare Other

## 2017-06-03 DIAGNOSIS — D473 Essential (hemorrhagic) thrombocythemia: Secondary | ICD-10-CM

## 2017-06-03 LAB — CBC WITH DIFFERENTIAL/PLATELET
BASO%: 2.7 % — ABNORMAL HIGH (ref 0.0–2.0)
BASOS ABS: 0.5 10*3/uL — AB (ref 0.0–0.1)
EOS ABS: 0.1 10*3/uL (ref 0.0–0.5)
EOS%: 0.4 % (ref 0.0–7.0)
HEMATOCRIT: 27.6 % — AB (ref 38.4–49.9)
HGB: 8.7 g/dL — ABNORMAL LOW (ref 13.0–17.1)
LYMPH%: 15.5 % (ref 14.0–49.0)
MCH: 32.7 pg (ref 27.2–33.4)
MCHC: 31.5 g/dL — AB (ref 32.0–36.0)
MCV: 103.8 fL — AB (ref 79.3–98.0)
MONO#: 2.2 10*3/uL — AB (ref 0.1–0.9)
MONO%: 12.3 % (ref 0.0–14.0)
NEUT#: 12.2 10*3/uL — ABNORMAL HIGH (ref 1.5–6.5)
NEUT%: 69.1 % (ref 39.0–75.0)
PLATELETS: 415 10*3/uL — AB (ref 140–400)
RBC: 2.66 10*6/uL — AB (ref 4.20–5.82)
RDW: 20.5 % — ABNORMAL HIGH (ref 11.0–14.6)
WBC: 17.7 10*3/uL — ABNORMAL HIGH (ref 4.0–10.3)
lymph#: 2.7 10*3/uL (ref 0.9–3.3)
nRBC: 1 % — ABNORMAL HIGH (ref 0–0)

## 2017-06-03 LAB — COMPREHENSIVE METABOLIC PANEL
ALT: 12 U/L (ref 0–55)
AST: 20 U/L (ref 5–34)
Albumin: 3.6 g/dL (ref 3.5–5.0)
Alkaline Phosphatase: 91 U/L (ref 40–150)
Anion Gap: 9 mEq/L (ref 3–11)
BUN: 19.9 mg/dL (ref 7.0–26.0)
CALCIUM: 8.9 mg/dL (ref 8.4–10.4)
CHLORIDE: 108 meq/L (ref 98–109)
CO2: 26 mEq/L (ref 22–29)
CREATININE: 1.6 mg/dL — AB (ref 0.7–1.3)
EGFR: 39 mL/min/{1.73_m2} — ABNORMAL LOW (ref >60)
GLUCOSE: 94 mg/dL (ref 70–140)
POTASSIUM: 3.9 meq/L (ref 3.5–5.1)
SODIUM: 142 meq/L (ref 136–145)
Total Bilirubin: 0.48 mg/dL (ref 0.20–1.20)
Total Protein: 6.9 g/dL (ref 6.4–8.3)

## 2017-06-03 LAB — TECHNOLOGIST REVIEW

## 2017-06-03 NOTE — Telephone Encounter (Signed)
Pt came in and dropped off labs he had taken at the cancer center. I am sending back for review.

## 2017-06-10 ENCOUNTER — Other Ambulatory Visit: Payer: Self-pay | Admitting: Oncology

## 2017-06-15 DIAGNOSIS — H353221 Exudative age-related macular degeneration, left eye, with active choroidal neovascularization: Secondary | ICD-10-CM | POA: Diagnosis not present

## 2017-06-16 ENCOUNTER — Other Ambulatory Visit: Payer: Self-pay | Admitting: Oncology

## 2017-06-17 ENCOUNTER — Telehealth: Payer: Self-pay | Admitting: *Deleted

## 2017-06-17 ENCOUNTER — Telehealth: Payer: Self-pay | Admitting: Adult Health

## 2017-06-17 NOTE — Telephone Encounter (Signed)
Scheduled appt per 1/4 sch message - Patients daughter is aware of appt date and time.

## 2017-06-17 NOTE — Telephone Encounter (Signed)
This RN spoke with pt's dtr - Nicholas [name] ( noted on HIPPA form ) per her inquiry with information provided by her father regarding MD call and need for BMBX.  Questions answered related to abnormal labs and how a BMBX is performed- including length of procedure.  Nicholas [name] stated understanding and requested if possible to schedule on a Tuesday due to her work schedule and availability per her assistance with transportation and care related to her father.  This RN sent an in box message per above- note this RN cannot view request for appointment for BMBX to coordinate care.

## 2017-06-22 ENCOUNTER — Inpatient Hospital Stay: Payer: Medicare Other | Attending: Oncology | Admitting: Hematology and Oncology

## 2017-06-22 ENCOUNTER — Other Ambulatory Visit: Payer: Self-pay | Admitting: Adult Health

## 2017-06-22 ENCOUNTER — Inpatient Hospital Stay: Payer: Medicare Other

## 2017-06-22 VITALS — BP 134/69 | HR 69 | Temp 97.7°F | Resp 18

## 2017-06-22 DIAGNOSIS — D649 Anemia, unspecified: Secondary | ICD-10-CM | POA: Diagnosis not present

## 2017-06-22 DIAGNOSIS — Z87891 Personal history of nicotine dependence: Secondary | ICD-10-CM | POA: Insufficient documentation

## 2017-06-22 DIAGNOSIS — Z7982 Long term (current) use of aspirin: Secondary | ICD-10-CM | POA: Insufficient documentation

## 2017-06-22 DIAGNOSIS — D473 Essential (hemorrhagic) thrombocythemia: Secondary | ICD-10-CM

## 2017-06-22 DIAGNOSIS — D4989 Neoplasm of unspecified behavior of other specified sites: Secondary | ICD-10-CM | POA: Diagnosis not present

## 2017-06-22 DIAGNOSIS — D539 Nutritional anemia, unspecified: Secondary | ICD-10-CM | POA: Diagnosis not present

## 2017-06-22 LAB — CBC WITH DIFFERENTIAL (CANCER CENTER ONLY)
Abs Granulocyte: 9.2 10*3/uL — ABNORMAL HIGH (ref 1.5–6.5)
BASOS ABS: 0.3 10*3/uL — AB (ref 0.0–0.1)
Basophils Relative: 3 %
EOS ABS: 0.1 10*3/uL (ref 0.0–0.5)
Eosinophils Relative: 1 %
HCT: 28.2 % — ABNORMAL LOW (ref 38.4–49.9)
HEMOGLOBIN: 8.8 g/dL — AB (ref 13.0–17.1)
Lymphocytes Relative: 17 %
Lymphs Abs: 2.4 10*3/uL (ref 0.9–3.3)
MCH: 32.5 pg (ref 27.2–33.4)
MCHC: 31.2 g/dL — AB (ref 32.0–36.0)
MCV: 104.1 fL — ABNORMAL HIGH (ref 79.3–98.0)
MONOS PCT: 12 %
Monocytes Absolute: 1.6 10*3/uL — ABNORMAL HIGH (ref 0.1–0.9)
NEUTROS ABS: 9.2 10*3/uL — AB (ref 1.5–6.5)
NEUTROS PCT: 67 %
Platelet Count: 334 10*3/uL (ref 140–400)
RBC: 2.71 MIL/uL — AB (ref 4.20–5.82)
RDW: 20.2 % — ABNORMAL HIGH (ref 11.0–15.6)
WBC: 13.6 10*3/uL — AB (ref 4.0–10.3)

## 2017-06-22 NOTE — Progress Notes (Addendum)
INDICATION: ET, r/u MDS, leukemia    Bone Marrow Biopsy and Aspiration Procedure Note   Informed consent was obtained and potential risks including bleeding, infection and pain were reviewed with the patient.  The patient's name, date of birth, identification, consent and allergies were verified prior to the start of procedure and time out was performed.  The right posterior iliac crest was chosen as the site of biopsy.  The skin was prepped with ChloraPrep.   3 cc of 2% lidocaine was used to provide local anaesthesia.   10 cc of bone marrow aspirate was obtained followed by 1cm biopsy.  Pressure was applied to the biopsy site and bandage was placed over the biopsy site. Patient was made to lie on the back for 30 mins prior to discharge.  The procedure was tolerated well. COMPLICATIONS: None BLOOD LOSS: none The patient was discharged home in stable condition with a 1 week follow up to review results.  Patient was provided with post bone marrow biopsy instructions and instructed to call if there was any bleeding or worsening pain.  Specimens sent for flow cytometry, cytogenetics and additional studies.  Signed Scot Dock, NP  Addendum: Attending note I supervised above procedure from beginning to end. I assisted in the performance of this procedure as well. No blood loss no complications

## 2017-06-22 NOTE — Progress Notes (Signed)
Pt tolerated BMB well by MD Lindi Adie.  Stayed flat for 30 minutes post BMB for observation.  VS stable.  A&Ox4 and ambulatory w/steady gait.

## 2017-06-22 NOTE — Patient Instructions (Signed)
Bone Marrow Aspiration and Bone Marrow Biopsy, Adult, Care After This sheet gives you information about how to care for yourself after your procedure. Your health care provider may also give you more specific instructions. If you have problems or questions, contact your health care provider. What can I expect after the procedure? After the procedure, it is common to have:  Mild pain and tenderness.  Swelling.  Bruising.  Follow these instructions at home:  Take over-the-counter or prescription medicines only as told by your health care provider.  Do not take baths, swim, or use a hot tub until your health care provider approves. Ask if you can take a shower or have a sponge bath.  Follow instructions from your health care provider about how to take care of the puncture site. Make sure you: ? Wash your hands with soap and water before you change your bandage (dressing). If soap and water are not available, use hand sanitizer. ? Change your dressing as told by your health care provider.  Check your puncture siteevery day for signs of infection. Check for: ? More redness, swelling, or pain. ? More fluid or blood. ? Warmth. ? Pus or a bad smell.  Return to your normal activities as told by your health care provider. Ask your health care provider what activities are safe for you.  Do not drive for 24 hours if you were given a medicine to help you relax (sedative).  Keep all follow-up visits as told by your health care provider. This is important. Contact a health care provider if:  You have more redness, swelling, or pain around the puncture site.  You have more fluid or blood coming from the puncture site.  Your puncture site feels warm to the touch.  You have pus or a bad smell coming from the puncture site.  You have a fever.  Your pain is not controlled with medicine. This information is not intended to replace advice given to you by your health care provider. Make sure  you discuss any questions you have with your health care provider. Document Released: 12/18/2004 Document Revised: 12/19/2015 Document Reviewed: 11/12/2015 Elsevier Interactive Patient Education  2018 [employer].

## 2017-06-24 ENCOUNTER — Telehealth: Payer: Self-pay | Admitting: Hematology and Oncology

## 2017-06-24 NOTE — Telephone Encounter (Signed)
Appts already scheduled per 1/9 los.

## 2017-07-04 NOTE — Progress Notes (Signed)
ID: Nicholas [name]   DOB: [birth year]  MR#: [id number]  PTW#:[id number]  PCP: Denita Lung, MD GYN: SU:  OTHER MD:  CHIEF COMPLAINT:  Essential thrombocytosis and chronic anemia  CURRENT TREATMENT: Anagrelide, ASA; switching to hydroxyurea  INTERVAL HISTORY: Mr. Dollins returns today for follow-up and treatment of his myeloproliferative neoplasm, namely essential thrombocytosis which may be transitioning to myelofibrosis. He is accompanied by his daughter Tye [place] today. He reports that he is not feeling well overall, due to increased fatigue. He takes 1,000 mg Vitamin D daily as well as Anagrelide, which for a 60-90 day prescription he pays about $10 for.   After his last visit here particularly because his hemoglobin appears lower and his MCV higher I set him up for bone marrow biopsy, which was obtained 06/22/2017.  This showed ([id number]) a hypercellular bone marrow with myeloid neoplasm associated with fibrosis. Peripheral blood with macrocytic anemia. Leukocytosis with leukoerythroblastic reaction.  This was interpreted as consistent with early myelofibrosis  On 06/22/17 he had a normal cytogenics but additional FISH probes are pending  He is here today to discuss results of these findings    REVIEW OF SYSTEMS: Erubiel reports that he had right shoulder pain x 3-4 days and he is unsure of the cause of this.  However it is getting better on its own.  He denies appetite issues or taste changes. He reports a ten lb weight gain. He intermittently has a sensation of feeling full early. He reports intermittent diarrhea that resolves on his own and then turns into constipation. He denies issues with his urinary stream and notes that he has urinary frequency. He denies fever or drenching sweats. He reports that his wife has dementia that is progressively worsening. He denies unusual headaches, visual changes, nausea, vomiting, or dizziness. There has been no unusual cough, phlegm production, or  pleurisy. This been no change in bowel or bladder habits. She denies unexplained fatigue or unexplained weight loss, bleeding, rash, or fever. A detailed review of systems was otherwise stable.   PAST MEDICAL HISTORY: Past Medical History:  Diagnosis Date  . Abnormal CT scan, sinus 10/04/06   pansinusitis, mildly deviated septum  . Allergy   . Anemia    related to CKD  . Chronic sinusitis   . Dysthymia    dysthymia  . H/O echocardiogram 01/04/2007   LV function normal, 55-60% EF, mildly increased thickenss of LV wall  . H/O echocardiogram 12/2006   mild LV wall thickening, EF 55-60%, LV function normal  . History of MRI of brain and brain stem 02/26/06   sinusitis, nonspecific deep white matter changes normal for age  . Hypertension   . Seborrheic keratosis    sees Toni Arthurs, NP dermatology  . Thrombocytosis ([place])    sees hematology    PAST SURGICAL HISTORY: Past Surgical History:  Procedure Laterality Date  . HERNIA REPAIR     right inguinal repair  . TONSILLECTOMY     childhood  . VARICOSE VEIN SURGERY     right lower leg    FAMILY HISTORY Family History  Problem Relation Age of Onset  . Heart disease Father   . Stroke Father   . Cancer Neg Hx   . COPD Neg Hx   . Diabetes Neg Hx     SOCIAL HISTORY:  (Reviewed 11/28/2013) The patient lives with his wife, who suffers from diabetes and emphysema and has had some small strokes. She is able to care for herself. He gives  her her insulin shots. He has 2 daughters, [name] who [place] in [place] and [place] who lives near the [employer] and works with Dr [employer] .The patient has 3 grandchildren.[name]    ADVANCED DIRECTIVES: the patient tells me his daughter Dat Derksen would make health care decisions if he is incapacitated   HEALTH MAINTENANCE: (Updated 11/28/2013) Social History   Tobacco Use  . Smoking status: Former Smoker    Last attempt to quit: 06/14/1958    Years since quitting: 59.1  . Smokeless tobacco:  Never Used  Substance Use Topics  . Alcohol use: No  . Drug use: No     Colonoscopy:  Not on file  PSA: Not on file  Bone density: Never  Lipid panel: October 2014  Allergies  Allergen Reactions  . Erythromycin     Current Outpatient Medications  Medication Sig Dispense Refill  . acetaminophen (TYLENOL) 500 MG tablet Take 500 mg by mouth every 6 (six) hours as needed. Reported on 07/30/2015    . anagrelide (AGRYLIN) 0.5 MG capsule Take 1 capsule (0.5 mg total) by mouth daily. 90 capsule 4  . aspirin 81 MG tablet Take 1 tablet (81 mg total) by mouth daily. 90 tablet 3  . CVS VITAMIN D3 1000 units capsule TAKE 1 TABLET (1,000 UNITS TOTAL) BY MOUTH DAILY. 90 capsule 3  . CVS VITAMIN D3 1000 units capsule TAKE 1 TABLET (1,000 UNITS TOTAL) BY MOUTH DAILY. 90 capsule 3  . FLUoxetine (PROZAC) 10 MG tablet Take 1 tablet (10 mg total) by mouth daily. 90 tablet 3  . FLUoxetine (PROZAC) 40 MG capsule Take 1 capsule (40 mg total) by mouth daily. 90 capsule 3  . sodium chloride (OCEAN) 0.65 % nasal spray Place 1 spray into the nose as needed.    . terazosin (HYTRIN) 2 MG capsule Take 1 capsule (2 mg total) by mouth daily. 90 capsule 3   No current facility-administered medications for this visit.     OBJECTIVE: Elderly white man in no acute distress  Vitals:   07/12/17 1525  BP: (!) 125/53  Pulse: 78  Resp: 18  Temp: 98.5 F (36.9 C)  SpO2: 97%     Body mass index is 22.53 kg/m.    ECOG FS: 1 Filed Weights   07/12/17 1525  Weight: 170 lb 12.8 oz (77.5 kg)   Sclerae unicteric, EOMs intact Oropharynx clear and moist No cervical or supraclavicular adenopathy Lungs no rales or rhonchi Heart regular rate and rhythm Abd soft, nontender, positive bowel sounds; unable to palpate a definitive splenic edge secondary to muscular abdomen MSK no focal spinal tenderness, no upper extremity lymphedema Neuro: nonfocal, well oriented, appropriate affect   LAB RESULTS:   Lab Results   Component Value Date   WBC 16.9 (H) 07/12/2017   NEUTROABS 11.6 (H) 07/12/2017   HGB 8.7 (L) 07/12/2017   HCT 28.0 (L) 07/12/2017   MCV 103.7 (H) 07/12/2017   PLT 366 07/12/2017      Chemistry      Component Value Date/Time   NA 142 06/03/2017 1404   K 3.9 06/03/2017 1404   CL 108 02/01/2017 1533   CO2 26 06/03/2017 1404   BUN 19.9 06/03/2017 1404   CREATININE 1.6 (H) 06/03/2017 1404      Component Value Date/Time   CALCIUM 8.9 06/03/2017 1404   ALKPHOS 91 06/03/2017 1404   AST 20 06/03/2017 1404   ALT 12 06/03/2017 1404   BILITOT 0.48 06/03/2017 1404  STUDIES: Bone marrow biopsy results given to the patient and discussed with him and his daughter  ASSESSMENT: 84 y.o.  [place] man with   (1)  a history of essential thrombocytosis initially diagnosed December 2003,  controlled on anagrelide 0.5 mg daily and aspirin 81 mg daily.   (a) no evidence of leukemic transformation  (2) anemia, mostly secondary to chronic kidney disease   (3) possible transition to myelofibrosis on bone marrow biopsy 06/22/2017,  (a) normal cytogenetics  (b) FISH studies pending  PLAN: Mr. Wassink is now just over 15 years out from diagnosis of myelofibrosis.  His platelet count has been very stably controlled on anagrelide alone and he has tolerated that well, although he has had some difficulty obtaining the medication.  More recently he has become more anemic and his MCV had risen.  Accordingly we obtained a bone marrow biopsy which shows no evidence of leukemia.  There is a concern regarding possible myelodysplasia, although this was not overt.  Fish studies are pending, but cytogenetics so far are normal.    What the marrow does show is some fibrosis.  Essential thrombocytosis after long courses can transition to myelofibrosis which is a more aggressive condition.  It is generally accompanied by worsening cytopenias, severe splenomegaly, and has a higher rate of transformation to  acute leukemia.  I think Rosa is in the early stages of myelofibrosis.  I would not put him through JAK2 inhibitor or start him on thalidomide/prednisone, but I think it would be useful if we switched him to hydroxy urea at this point.  This will control the white cells as well as the platelet count.  And since the elevated white cell count has been associated with more rapid transition to fibrosis at this may slow things down.  I am starting him at 500 mg daily.  His first dose will be when he runs out of his current anagrelide dose.  We will adjust the dose to bring the white cell count to the normal range with an acceptable platelet count  In the meantime I think most of his fatigue is going to be due to to anemia.  He has no fevers, drenching sweats, or any GI symptoms suggestive of severe splenomegaly.  He does have chronic kidney disease stage III so I am going to start him on Aranesp, beginning next week.  He will receive a dose every 21 days.  This will allow me as well to make sure his counts are checked frequently as we transition him to hydroxyurea.  We discussed the possible toxicity side effects and complications of this and he understands this has been associated with strokes and clotting complications but that is usually in patients who have pushed the hemoglobin high.  Our goal is to keep the hemoglobin between 10 and 11.  I am going to see him again sometime in April, but once he is in a stable Hydrea dose and a stable Aranesp dose we can revert to visits here once a year with labs as needed for his EPO treatments.    Axel Meas, Virgie Dad, MD  07/12/17 3:56 PM Medical Oncology and Hematology [facility] [address] [place], [place] [address] Tel. [phone]    Fax. [phone]    This document serves as a record of services personally performed by Lurline Del, MD. It was created on his behalf by Steva Colder, a trained medical scribe. The creation of this  record is based on the scribe's personal  observations and the provider's statements to them.   I have reviewed the above documentation for accuracy and completeness, and I agree with the above.

## 2017-07-07 ENCOUNTER — Other Ambulatory Visit: Payer: Self-pay | Admitting: Oncology

## 2017-07-11 ENCOUNTER — Encounter (HOSPITAL_COMMUNITY): Payer: Self-pay

## 2017-07-12 ENCOUNTER — Inpatient Hospital Stay (HOSPITAL_BASED_OUTPATIENT_CLINIC_OR_DEPARTMENT_OTHER): Payer: Medicare Other | Admitting: Oncology

## 2017-07-12 ENCOUNTER — Telehealth: Payer: Self-pay | Admitting: Oncology

## 2017-07-12 ENCOUNTER — Inpatient Hospital Stay: Payer: Medicare Other

## 2017-07-12 VITALS — BP 125/53 | HR 78 | Temp 98.5°F | Resp 18 | Ht 73.0 in | Wt 170.8 lb

## 2017-07-12 DIAGNOSIS — R161 Splenomegaly, not elsewhere classified: Secondary | ICD-10-CM

## 2017-07-12 DIAGNOSIS — D473 Essential (hemorrhagic) thrombocythemia: Secondary | ICD-10-CM

## 2017-07-12 DIAGNOSIS — D649 Anemia, unspecified: Secondary | ICD-10-CM

## 2017-07-12 DIAGNOSIS — N183 Chronic kidney disease, stage 3 unspecified: Secondary | ICD-10-CM

## 2017-07-12 DIAGNOSIS — Z87891 Personal history of nicotine dependence: Secondary | ICD-10-CM | POA: Diagnosis not present

## 2017-07-12 DIAGNOSIS — D631 Anemia in chronic kidney disease: Secondary | ICD-10-CM

## 2017-07-12 DIAGNOSIS — Z7982 Long term (current) use of aspirin: Secondary | ICD-10-CM

## 2017-07-12 LAB — COMPREHENSIVE METABOLIC PANEL
ALT: 13 U/L (ref 0–55)
AST: 22 U/L (ref 5–34)
Albumin: 3.5 g/dL (ref 3.5–5.0)
Alkaline Phosphatase: 96 U/L (ref 40–150)
Anion gap: 7 (ref 3–11)
BUN: 23 mg/dL (ref 7–26)
CHLORIDE: 109 mmol/L (ref 98–109)
CO2: 25 mmol/L (ref 22–29)
Calcium: 8.5 mg/dL (ref 8.4–10.4)
Creatinine, Ser: 1.59 mg/dL — ABNORMAL HIGH (ref 0.70–1.30)
GFR, EST AFRICAN AMERICAN: 43 mL/min — AB (ref >60)
GFR, EST NON AFRICAN AMERICAN: 37 mL/min — AB (ref >60)
Glucose, Bld: 106 mg/dL (ref 70–140)
POTASSIUM: 4 mmol/L (ref 3.5–5.1)
Sodium: 141 mmol/L (ref 136–145)
Total Bilirubin: 0.5 mg/dL (ref 0.2–1.2)
Total Protein: 7.1 g/dL (ref 6.4–8.3)

## 2017-07-12 LAB — RETICULOCYTES
RBC.: 2.7 MIL/uL — AB (ref 4.20–5.82)
RETIC COUNT ABSOLUTE: 78.3 10*3/uL (ref 34.8–93.9)
Retic Ct Pct: 2.9 % — ABNORMAL HIGH (ref 0.8–1.8)

## 2017-07-12 LAB — CBC WITH DIFFERENTIAL/PLATELET
BASOS ABS: 0.4 10*3/uL — AB (ref 0.0–0.1)
Basophils Relative: 3 %
Eosinophils Absolute: 0.1 10*3/uL (ref 0.0–0.5)
Eosinophils Relative: 1 %
HEMATOCRIT: 28 % — AB (ref 38.4–49.9)
Hemoglobin: 8.7 g/dL — ABNORMAL LOW (ref 13.0–17.1)
Lymphocytes Relative: 16 %
Lymphs Abs: 2.6 10*3/uL (ref 0.9–3.3)
MCH: 32.2 pg (ref 27.2–33.4)
MCHC: 31.1 g/dL — ABNORMAL LOW (ref 32.0–36.0)
MCV: 103.7 fL — AB (ref 79.3–98.0)
Monocytes Absolute: 2.2 10*3/uL — ABNORMAL HIGH (ref 0.1–0.9)
Monocytes Relative: 13 %
NEUTROS ABS: 11.6 10*3/uL — AB (ref 1.5–6.5)
NEUTROS PCT: 67 %
Platelets: 366 10*3/uL (ref 140–400)
RBC: 2.7 MIL/uL — AB (ref 4.20–5.82)
RDW: 19.4 % — ABNORMAL HIGH (ref 11.0–15.6)
WBC: 16.9 10*3/uL — ABNORMAL HIGH (ref 4.0–10.3)

## 2017-07-12 LAB — FOLATE: Folate: 10.7 ng/mL (ref >5.9)

## 2017-07-12 LAB — VITAMIN B12: Vitamin B-12: 660 pg/mL (ref 180–914)

## 2017-07-12 LAB — LACTATE DEHYDROGENASE: LDH: 444 U/L — AB (ref 125–245)

## 2017-07-12 MED ORDER — HYDROXYUREA 500 MG PO CAPS: 500.0000 mg | ORAL_CAPSULE | Freq: Every day | ORAL | 3 refills | Status: DC

## 2017-07-12 NOTE — Telephone Encounter (Signed)
Gave patient AVS and calendar of upcoming February through April appointments.  °

## 2017-07-13 LAB — IRON AND TIBC
Iron: 99 ug/dL (ref 42–163)
Saturation Ratios: 40 % — ABNORMAL LOW (ref 42–163)
TIBC: 246 ug/dL (ref 202–409)
UIBC: 148 ug/dL

## 2017-07-13 LAB — FERRITIN: Ferritin: 354 ng/mL — ABNORMAL HIGH (ref 22–316)

## 2017-07-19 ENCOUNTER — Other Ambulatory Visit: Payer: Self-pay | Admitting: Oncology

## 2017-07-19 ENCOUNTER — Inpatient Hospital Stay: Payer: Medicare Other

## 2017-07-19 ENCOUNTER — Inpatient Hospital Stay: Payer: Medicare Other | Attending: Oncology

## 2017-07-19 ENCOUNTER — Encounter: Payer: Self-pay | Admitting: Oncology

## 2017-07-19 VITALS — BP 128/68

## 2017-07-19 DIAGNOSIS — D473 Essential (hemorrhagic) thrombocythemia: Secondary | ICD-10-CM

## 2017-07-19 DIAGNOSIS — D631 Anemia in chronic kidney disease: Secondary | ICD-10-CM

## 2017-07-19 DIAGNOSIS — D649 Anemia, unspecified: Secondary | ICD-10-CM | POA: Diagnosis not present

## 2017-07-19 DIAGNOSIS — N183 Chronic kidney disease, stage 3 unspecified: Secondary | ICD-10-CM

## 2017-07-19 DIAGNOSIS — N189 Chronic kidney disease, unspecified: Secondary | ICD-10-CM | POA: Diagnosis not present

## 2017-07-19 LAB — COMPREHENSIVE METABOLIC PANEL
ALK PHOS: 75 U/L (ref 40–150)
ALT: 13 U/L (ref 0–55)
ANION GAP: 8 (ref 3–11)
AST: 19 U/L (ref 5–34)
Albumin: 3.5 g/dL (ref 3.5–5.0)
BUN: 26 mg/dL (ref 7–26)
CALCIUM: 8.8 mg/dL (ref 8.4–10.4)
CO2: 26 mmol/L (ref 22–29)
Chloride: 107 mmol/L (ref 98–109)
Creatinine, Ser: 1.63 mg/dL — ABNORMAL HIGH (ref 0.70–1.30)
GFR calc non Af Amer: 36 mL/min — ABNORMAL LOW (ref >60)
GFR, EST AFRICAN AMERICAN: 42 mL/min — AB (ref >60)
GLUCOSE: 107 mg/dL (ref 70–140)
Potassium: 4.4 mmol/L (ref 3.5–5.1)
Sodium: 141 mmol/L (ref 136–145)
Total Bilirubin: 0.5 mg/dL (ref 0.2–1.2)
Total Protein: 7.1 g/dL (ref 6.4–8.3)

## 2017-07-19 LAB — CBC WITH DIFFERENTIAL/PLATELET
Basophils Absolute: 0.4 10*3/uL — ABNORMAL HIGH (ref 0.0–0.1)
Basophils Relative: 3 %
Eosinophils Absolute: 0.1 10*3/uL (ref 0.0–0.5)
Eosinophils Relative: 0 %
HEMATOCRIT: 26.1 % — AB (ref 38.4–49.9)
HEMOGLOBIN: 8.8 g/dL — AB (ref 13.0–17.1)
LYMPHS ABS: 3.2 10*3/uL (ref 0.9–3.3)
Lymphocytes Relative: 20 %
MCH: 36.2 pg — AB (ref 27.2–33.4)
MCHC: 33.7 g/dL (ref 32.0–36.0)
MCV: 107.4 fL — ABNORMAL HIGH (ref 79.3–98.0)
MONOS PCT: 10 %
Monocytes Absolute: 1.6 10*3/uL — ABNORMAL HIGH (ref 0.1–0.9)
NEUTROS ABS: 10.3 10*3/uL — AB (ref 1.5–6.5)
NEUTROS PCT: 67 %
Platelets: 401 10*3/uL — ABNORMAL HIGH (ref 140–400)
RBC: 2.43 MIL/uL — ABNORMAL LOW (ref 4.20–5.82)
RDW: 21.7 % — ABNORMAL HIGH (ref 11.0–14.6)
WBC: 15.6 10*3/uL — ABNORMAL HIGH (ref 4.0–10.3)

## 2017-07-19 MED ORDER — DARBEPOETIN ALFA 200 MCG/0.4ML IJ SOSY
200.0000 ug | PREFILLED_SYRINGE | Freq: Once | INTRAMUSCULAR | Status: AC
  Administered 2017-07-19: 200 ug via SUBCUTANEOUS

## 2017-07-19 NOTE — Patient Instructions (Signed)

## 2017-07-26 ENCOUNTER — Ambulatory Visit (HOSPITAL_COMMUNITY)
Admission: RE | Admit: 2017-07-26 | Discharge: 2017-07-26 | Disposition: A | Discharge status: Home / Self Care | Payer: Medicare Other | Source: Ambulatory Visit | Attending: Oncology | Admitting: Oncology

## 2017-07-26 ENCOUNTER — Encounter (HOSPITAL_COMMUNITY): Payer: Self-pay

## 2017-07-26 DIAGNOSIS — D473 Essential (hemorrhagic) thrombocythemia: Secondary | ICD-10-CM | POA: Insufficient documentation

## 2017-07-26 DIAGNOSIS — I700 Atherosclerosis of aorta: Secondary | ICD-10-CM | POA: Diagnosis not present

## 2017-07-26 DIAGNOSIS — N400 Enlarged prostate without lower urinary tract symptoms: Secondary | ICD-10-CM | POA: Insufficient documentation

## 2017-07-26 DIAGNOSIS — R161 Splenomegaly, not elsewhere classified: Secondary | ICD-10-CM | POA: Diagnosis not present

## 2017-07-26 DIAGNOSIS — J439 Emphysema, unspecified: Secondary | ICD-10-CM | POA: Diagnosis not present

## 2017-07-26 DIAGNOSIS — K5730 Diverticulosis of large intestine without perforation or abscess without bleeding: Secondary | ICD-10-CM | POA: Insufficient documentation

## 2017-07-26 DIAGNOSIS — N183 Chronic kidney disease, stage 3 unspecified: Secondary | ICD-10-CM

## 2017-07-26 DIAGNOSIS — K7689 Other specified diseases of liver: Secondary | ICD-10-CM | POA: Diagnosis not present

## 2017-07-26 DIAGNOSIS — D631 Anemia in chronic kidney disease: Secondary | ICD-10-CM | POA: Insufficient documentation

## 2017-07-26 DIAGNOSIS — N281 Cyst of kidney, acquired: Secondary | ICD-10-CM | POA: Diagnosis not present

## 2017-07-26 MED ORDER — IOPAMIDOL (ISOVUE-300) INJECTION 61%
INTRAVENOUS | Status: AC
  Administered 2017-07-26: 80 mL via INTRAVENOUS
  Filled 2017-07-26: qty 100

## 2017-07-26 MED ORDER — IOPAMIDOL (ISOVUE-300) INJECTION 61%
100.0000 mL | Freq: Once | INTRAVENOUS | Status: AC | PRN
  Administered 2017-07-26: 80 mL via INTRAVENOUS

## 2017-07-27 ENCOUNTER — Other Ambulatory Visit: Payer: Self-pay | Admitting: Oncology

## 2017-07-27 LAB — JAK 2 EXON 12 (GENPATH)

## 2017-07-27 NOTE — Progress Notes (Signed)
Call him and let him know that his spleen is only large.  There are some "lesions" on the spring which are probably metaplastic bone marrow.

## 2017-07-28 LAB — TISSUE HYBRIDIZATION (BONE MARROW)-NCBH

## 2017-07-28 LAB — CHROMOSOME ANALYSIS, BONE MARROW

## 2017-08-05 DIAGNOSIS — D0462 Carcinoma in situ of skin of left upper limb, including shoulder: Secondary | ICD-10-CM | POA: Diagnosis not present

## 2017-08-05 DIAGNOSIS — L905 Scar conditions and fibrosis of skin: Secondary | ICD-10-CM | POA: Diagnosis not present

## 2017-08-09 ENCOUNTER — Inpatient Hospital Stay: Payer: Medicare Other

## 2017-08-09 VITALS — BP 141/67 | HR 70 | Temp 98.4°F | Resp 20

## 2017-08-09 DIAGNOSIS — N183 Chronic kidney disease, stage 3 unspecified: Secondary | ICD-10-CM

## 2017-08-09 DIAGNOSIS — D631 Anemia in chronic kidney disease: Secondary | ICD-10-CM

## 2017-08-09 DIAGNOSIS — D473 Essential (hemorrhagic) thrombocythemia: Secondary | ICD-10-CM

## 2017-08-09 DIAGNOSIS — R161 Splenomegaly, not elsewhere classified: Secondary | ICD-10-CM

## 2017-08-09 DIAGNOSIS — D649 Anemia, unspecified: Secondary | ICD-10-CM | POA: Diagnosis not present

## 2017-08-09 DIAGNOSIS — N189 Chronic kidney disease, unspecified: Secondary | ICD-10-CM | POA: Diagnosis not present

## 2017-08-09 LAB — CBC WITH DIFFERENTIAL/PLATELET
Basophils Absolute: 0.3 10*3/uL — ABNORMAL HIGH (ref 0.0–0.1)
Basophils Relative: 2 %
Eosinophils Absolute: 0.1 10*3/uL (ref 0.0–0.5)
Eosinophils Relative: 1 %
HEMATOCRIT: 32.2 % — AB (ref 38.4–49.9)
HEMOGLOBIN: 9.9 g/dL — AB (ref 13.0–17.1)
LYMPHS ABS: 2.2 10*3/uL (ref 0.9–3.3)
LYMPHS PCT: 17 %
MCH: 31.8 pg (ref 27.2–33.4)
MCHC: 30.7 g/dL — ABNORMAL LOW (ref 32.0–36.0)
MCV: 103.5 fL — AB (ref 79.3–98.0)
MONO ABS: 1.7 10*3/uL — AB (ref 0.1–0.9)
Monocytes Relative: 13 %
NEUTROS ABS: 8.5 10*3/uL — AB (ref 1.5–6.5)
Neutrophils Relative %: 67 %
Platelets: 294 10*3/uL (ref 140–400)
RBC: 3.11 MIL/uL — ABNORMAL LOW (ref 4.20–5.82)
RDW: 20 % — AB (ref 11.0–14.6)
Smear Review: 1
WBC: 12.8 10*3/uL — ABNORMAL HIGH (ref 4.0–10.3)
nRBC: 1 /100 WBC — ABNORMAL HIGH

## 2017-08-09 LAB — COMPREHENSIVE METABOLIC PANEL
ALK PHOS: 66 U/L (ref 40–150)
ALT: 14 U/L (ref 0–55)
ANION GAP: 9 (ref 3–11)
AST: 22 U/L (ref 5–34)
Albumin: 3.6 g/dL (ref 3.5–5.0)
BILIRUBIN TOTAL: 0.5 mg/dL (ref 0.2–1.2)
BUN: 28 mg/dL — AB (ref 7–26)
CALCIUM: 9.2 mg/dL (ref 8.4–10.4)
CO2: 24 mmol/L (ref 22–29)
Chloride: 109 mmol/L (ref 98–109)
Creatinine, Ser: 1.54 mg/dL — ABNORMAL HIGH (ref 0.70–1.30)
GFR calc Af Amer: 45 mL/min — ABNORMAL LOW (ref >60)
GFR calc non Af Amer: 39 mL/min — ABNORMAL LOW (ref >60)
GLUCOSE: 88 mg/dL (ref 70–140)
POTASSIUM: 4.5 mmol/L (ref 3.5–5.1)
Sodium: 142 mmol/L (ref 136–145)
TOTAL PROTEIN: 7.2 g/dL (ref 6.4–8.3)

## 2017-08-09 MED ORDER — DARBEPOETIN ALFA 200 MCG/0.4ML IJ SOSY
200.0000 ug | PREFILLED_SYRINGE | Freq: Once | INTRAMUSCULAR | Status: AC
  Administered 2017-08-09: 200 ug via SUBCUTANEOUS

## 2017-08-09 NOTE — Patient Instructions (Signed)

## 2017-08-10 DIAGNOSIS — H353221 Exudative age-related macular degeneration, left eye, with active choroidal neovascularization: Secondary | ICD-10-CM | POA: Diagnosis not present

## 2017-08-22 ENCOUNTER — Telehealth: Payer: Self-pay | Admitting: Family Medicine

## 2017-08-22 DIAGNOSIS — S50312A Abrasion of left elbow, initial encounter: Secondary | ICD-10-CM | POA: Diagnosis not present

## 2017-08-22 NOTE — Telephone Encounter (Signed)
Patient at urgent care for fall and large scrape of arm.  [name] called and needed last tdap.

## 2017-08-30 ENCOUNTER — Inpatient Hospital Stay: Payer: Medicare Other | Attending: Oncology

## 2017-08-30 ENCOUNTER — Inpatient Hospital Stay: Payer: Medicare Other

## 2017-08-30 VITALS — BP 132/60 | HR 77 | Temp 98.1°F | Resp 18

## 2017-08-30 DIAGNOSIS — D631 Anemia in chronic kidney disease: Secondary | ICD-10-CM | POA: Diagnosis present

## 2017-08-30 DIAGNOSIS — R161 Splenomegaly, not elsewhere classified: Secondary | ICD-10-CM

## 2017-08-30 DIAGNOSIS — D473 Essential (hemorrhagic) thrombocythemia: Secondary | ICD-10-CM

## 2017-08-30 DIAGNOSIS — N183 Chronic kidney disease, stage 3 unspecified: Secondary | ICD-10-CM

## 2017-08-30 DIAGNOSIS — N189 Chronic kidney disease, unspecified: Secondary | ICD-10-CM | POA: Diagnosis present

## 2017-08-30 DIAGNOSIS — D649 Anemia, unspecified: Secondary | ICD-10-CM | POA: Diagnosis not present

## 2017-08-30 LAB — CBC WITH DIFFERENTIAL/PLATELET
Basophils Absolute: 0.3 10*3/uL — ABNORMAL HIGH (ref 0.0–0.1)
Basophils Relative: 2 %
EOS ABS: 0.1 10*3/uL (ref 0.0–0.5)
EOS PCT: 1 %
HCT: 31.5 % — ABNORMAL LOW (ref 38.4–49.9)
Hemoglobin: 9.8 g/dL — ABNORMAL LOW (ref 13.0–17.1)
LYMPHS ABS: 2.3 10*3/uL (ref 0.9–3.3)
Lymphocytes Relative: 18 %
MCH: 32.9 pg (ref 27.2–33.4)
MCHC: 31.1 g/dL — ABNORMAL LOW (ref 32.0–36.0)
MCV: 105.7 fL — AB (ref 79.3–98.0)
MONO ABS: 1.7 10*3/uL — AB (ref 0.1–0.9)
Monocytes Relative: 13 %
NRBC: 1 /100{WBCs} — AB
Neutro Abs: 8.4 10*3/uL — ABNORMAL HIGH (ref 1.5–6.5)
Neutrophils Relative %: 66 %
PLATELETS: 303 10*3/uL (ref 140–400)
RBC: 2.98 MIL/uL — ABNORMAL LOW (ref 4.20–5.82)
RDW: 20.8 % — ABNORMAL HIGH (ref 11.0–14.6)
WBC: 12.7 10*3/uL — ABNORMAL HIGH (ref 4.0–10.3)

## 2017-08-30 LAB — COMPREHENSIVE METABOLIC PANEL
ALT: 12 U/L (ref 0–55)
ANION GAP: 7 (ref 3–11)
AST: 21 U/L (ref 5–34)
Albumin: 3.5 g/dL (ref 3.5–5.0)
Alkaline Phosphatase: 78 U/L (ref 40–150)
BUN: 24 mg/dL (ref 7–26)
CALCIUM: 9 mg/dL (ref 8.4–10.4)
CHLORIDE: 109 mmol/L (ref 98–109)
CO2: 26 mmol/L (ref 22–29)
CREATININE: 1.52 mg/dL — AB (ref 0.70–1.30)
GFR calc Af Amer: 46 mL/min — ABNORMAL LOW (ref >60)
GFR calc non Af Amer: 39 mL/min — ABNORMAL LOW (ref >60)
Glucose, Bld: 90 mg/dL (ref 70–140)
Potassium: 4.4 mmol/L (ref 3.5–5.1)
Sodium: 142 mmol/L (ref 136–145)
Total Bilirubin: 0.6 mg/dL (ref 0.2–1.2)
Total Protein: 7.1 g/dL (ref 6.4–8.3)

## 2017-08-30 MED ORDER — DARBEPOETIN ALFA 200 MCG/0.4ML IJ SOSY
200.0000 ug | PREFILLED_SYRINGE | Freq: Once | INTRAMUSCULAR | Status: AC
  Administered 2017-08-30: 200 ug via SUBCUTANEOUS

## 2017-08-30 MED ORDER — DARBEPOETIN ALFA 200 MCG/0.4ML IJ SOSY
PREFILLED_SYRINGE | INTRAMUSCULAR | Status: AC
Start: 2017-08-30 — End: 2017-08-30
  Filled 2017-08-30: qty 0.4

## 2017-08-30 NOTE — Patient Instructions (Signed)

## 2017-09-01 ENCOUNTER — Other Ambulatory Visit: Payer: Medicare Other

## 2017-09-13 ENCOUNTER — Emergency Department (HOSPITAL_COMMUNITY): Payer: Medicare Other

## 2017-09-13 ENCOUNTER — Encounter (HOSPITAL_COMMUNITY): Payer: Self-pay | Admitting: Emergency Medicine

## 2017-09-13 ENCOUNTER — Inpatient Hospital Stay (HOSPITAL_COMMUNITY)
Admission: EM | Admit: 2017-09-13 | Discharge: 2017-09-19 | DRG: 854 | Disposition: A | Discharge status: Home Health | Payer: Medicare Other | Attending: Internal Medicine | Admitting: Internal Medicine

## 2017-09-13 ENCOUNTER — Other Ambulatory Visit: Payer: Self-pay

## 2017-09-13 DIAGNOSIS — F32A Depression, unspecified: Secondary | ICD-10-CM | POA: Diagnosis present

## 2017-09-13 DIAGNOSIS — Z91048 Other nonmedicinal substance allergy status: Secondary | ICD-10-CM

## 2017-09-13 DIAGNOSIS — S41151A Open bite of right upper arm, initial encounter: Secondary | ICD-10-CM | POA: Diagnosis not present

## 2017-09-13 DIAGNOSIS — N400 Enlarged prostate without lower urinary tract symptoms: Secondary | ICD-10-CM | POA: Diagnosis present

## 2017-09-13 DIAGNOSIS — A419 Sepsis, unspecified organism: Secondary | ICD-10-CM | POA: Diagnosis not present

## 2017-09-13 DIAGNOSIS — S61051A Open bite of right thumb without damage to nail, initial encounter: Secondary | ICD-10-CM | POA: Diagnosis present

## 2017-09-13 DIAGNOSIS — F329 Major depressive disorder, single episode, unspecified: Secondary | ICD-10-CM | POA: Diagnosis present

## 2017-09-13 DIAGNOSIS — E559 Vitamin D deficiency, unspecified: Secondary | ICD-10-CM | POA: Diagnosis present

## 2017-09-13 DIAGNOSIS — I10 Essential (primary) hypertension: Secondary | ICD-10-CM | POA: Diagnosis not present

## 2017-09-13 DIAGNOSIS — W540XXA Bitten by dog, initial encounter: Secondary | ICD-10-CM

## 2017-09-13 DIAGNOSIS — R161 Splenomegaly, not elsewhere classified: Secondary | ICD-10-CM | POA: Diagnosis present

## 2017-09-13 DIAGNOSIS — Z881 Allergy status to other antibiotic agents status: Secondary | ICD-10-CM

## 2017-09-13 DIAGNOSIS — Z6826 Body mass index (BMI) 26.0-26.9, adult: Secondary | ICD-10-CM

## 2017-09-13 DIAGNOSIS — H9190 Unspecified hearing loss, unspecified ear: Secondary | ICD-10-CM | POA: Diagnosis present

## 2017-09-13 DIAGNOSIS — S61451A Open bite of right hand, initial encounter: Secondary | ICD-10-CM | POA: Diagnosis present

## 2017-09-13 DIAGNOSIS — L03113 Cellulitis of right upper limb: Secondary | ICD-10-CM | POA: Diagnosis not present

## 2017-09-13 DIAGNOSIS — J329 Chronic sinusitis, unspecified: Secondary | ICD-10-CM | POA: Diagnosis present

## 2017-09-13 DIAGNOSIS — Z79899 Other long term (current) drug therapy: Secondary | ICD-10-CM

## 2017-09-13 DIAGNOSIS — Z87891 Personal history of nicotine dependence: Secondary | ICD-10-CM

## 2017-09-13 DIAGNOSIS — L821 Other seborrheic keratosis: Secondary | ICD-10-CM | POA: Diagnosis present

## 2017-09-13 DIAGNOSIS — E440 Moderate protein-calorie malnutrition: Secondary | ICD-10-CM | POA: Diagnosis not present

## 2017-09-13 DIAGNOSIS — I129 Hypertensive chronic kidney disease with stage 1 through stage 4 chronic kidney disease, or unspecified chronic kidney disease: Secondary | ICD-10-CM | POA: Diagnosis present

## 2017-09-13 DIAGNOSIS — N189 Chronic kidney disease, unspecified: Secondary | ICD-10-CM

## 2017-09-13 DIAGNOSIS — D473 Essential (hemorrhagic) thrombocythemia: Secondary | ICD-10-CM | POA: Diagnosis not present

## 2017-09-13 DIAGNOSIS — Z23 Encounter for immunization: Secondary | ICD-10-CM

## 2017-09-13 DIAGNOSIS — D631 Anemia in chronic kidney disease: Secondary | ICD-10-CM | POA: Diagnosis not present

## 2017-09-13 DIAGNOSIS — N183 Chronic kidney disease, stage 3 unspecified: Secondary | ICD-10-CM | POA: Diagnosis present

## 2017-09-13 DIAGNOSIS — Z7982 Long term (current) use of aspirin: Secondary | ICD-10-CM

## 2017-09-13 DIAGNOSIS — R197 Diarrhea, unspecified: Secondary | ICD-10-CM | POA: Diagnosis not present

## 2017-09-13 DIAGNOSIS — H3530 Unspecified macular degeneration: Secondary | ICD-10-CM | POA: Diagnosis present

## 2017-09-13 DIAGNOSIS — Z8249 Family history of ischemic heart disease and other diseases of the circulatory system: Secondary | ICD-10-CM

## 2017-09-13 DIAGNOSIS — M65131 Other infective (teno)synovitis, right wrist: Secondary | ICD-10-CM | POA: Diagnosis present

## 2017-09-13 LAB — URINALYSIS, ROUTINE W REFLEX MICROSCOPIC
BILIRUBIN URINE: NEGATIVE
Bacteria, UA: NONE SEEN
GLUCOSE, UA: NEGATIVE mg/dL
HGB URINE DIPSTICK: NEGATIVE
Ketones, ur: NEGATIVE mg/dL
Leukocytes, UA: NEGATIVE
NITRITE: NEGATIVE
PH: 7 (ref 5.0–8.0)
Protein, ur: 30 mg/dL — AB
SPECIFIC GRAVITY, URINE: 1.016 (ref 1.005–1.030)

## 2017-09-13 LAB — CBC WITH DIFFERENTIAL/PLATELET
BASOS ABS: 0 10*3/uL (ref 0.0–0.1)
BLASTS: 0 %
Band Neutrophils: 0 %
Basophils Relative: 0 %
Eosinophils Absolute: 0 10*3/uL (ref 0.0–0.7)
Eosinophils Relative: 0 %
HCT: 31.6 % — ABNORMAL LOW (ref 39.0–52.0)
Hemoglobin: 10.2 g/dL — ABNORMAL LOW (ref 13.0–17.0)
Lymphocytes Relative: 3 %
Lymphs Abs: 0.6 10*3/uL — ABNORMAL LOW (ref 0.7–4.0)
MCH: 35.5 pg — ABNORMAL HIGH (ref 26.0–34.0)
MCHC: 32.3 g/dL (ref 30.0–36.0)
MCV: 110.1 fL — ABNORMAL HIGH (ref 78.0–100.0)
METAMYELOCYTES PCT: 0 %
MONOS PCT: 8 %
MYELOCYTES: 0 %
Monocytes Absolute: 1.6 10*3/uL — ABNORMAL HIGH (ref 0.1–1.0)
NEUTROS ABS: 17.4 10*3/uL — AB (ref 1.7–7.7)
Neutrophils Relative %: 89 %
Other: 0 %
PLATELETS: 313 10*3/uL (ref 150–400)
Promyelocytes Absolute: 0 %
RBC: 2.87 MIL/uL — ABNORMAL LOW (ref 4.22–5.81)
RDW: 23.3 % — AB (ref 11.5–15.5)
WBC: 19.6 10*3/uL — ABNORMAL HIGH (ref 4.0–10.5)
nRBC: 0 /100 WBC

## 2017-09-13 LAB — COMPREHENSIVE METABOLIC PANEL
ALBUMIN: 3.6 g/dL (ref 3.5–5.0)
ALT: 14 U/L — AB (ref 17–63)
AST: 20 U/L (ref 15–41)
Alkaline Phosphatase: 71 U/L (ref 38–126)
Anion gap: 11 (ref 5–15)
BUN: 28 mg/dL — AB (ref 6–20)
CO2: 21 mmol/L — ABNORMAL LOW (ref 22–32)
CREATININE: 1.51 mg/dL — AB (ref 0.61–1.24)
Calcium: 8.7 mg/dL — ABNORMAL LOW (ref 8.9–10.3)
Chloride: 107 mmol/L (ref 101–111)
GFR calc Af Amer: 46 mL/min — ABNORMAL LOW (ref >60)
GFR calc non Af Amer: 40 mL/min — ABNORMAL LOW (ref >60)
Glucose, Bld: 118 mg/dL — ABNORMAL HIGH (ref 65–99)
POTASSIUM: 4.5 mmol/L (ref 3.5–5.1)
Sodium: 139 mmol/L (ref 135–145)
TOTAL PROTEIN: 7.1 g/dL (ref 6.5–8.1)
Total Bilirubin: 1 mg/dL (ref 0.3–1.2)

## 2017-09-13 LAB — I-STAT CG4 LACTIC ACID, ED: Lactic Acid, Venous: 1.79 mmol/L (ref 0.5–1.9)

## 2017-09-13 MED ORDER — TETANUS-DIPHTH-ACELL PERTUSSIS 5-2.5-18.5 LF-MCG/0.5 IM SUSP
0.5000 mL | Freq: Once | INTRAMUSCULAR | Status: AC
  Administered 2017-09-13: 0.5 mL via INTRAMUSCULAR
  Filled 2017-09-13: qty 0.5

## 2017-09-13 MED ORDER — SODIUM CHLORIDE 0.9 % IV SOLN
1.5000 g | Freq: Four times a day (QID) | INTRAVENOUS | Status: DC
  Administered 2017-09-13 – 2017-09-19 (×23): 1.5 g via INTRAVENOUS
  Filled 2017-09-13 (×25): qty 1.5

## 2017-09-13 MED ORDER — ACETAMINOPHEN 500 MG PO TABS
1000.0000 mg | ORAL_TABLET | Freq: Once | ORAL | Status: AC
  Administered 2017-09-13: 1000 mg via ORAL
  Filled 2017-09-13: qty 2

## 2017-09-13 NOTE — Progress Notes (Signed)
Pharmacy Antibiotic Note  Nicholas [name] is a 84 y.o. male admitted on 09/13/2017 with wound infection.  Pharmacy has been consulted for unasyn dosing. Patient was bit by family dog. WBC 12.7  Plan: Unasyn 1.5g IV q6 hours Monitor clinical progression F/u transition to oral antibiotics     Temp (24hrs), Avg:100 F (37.8 C), Min:100 F (37.8 C), Max:100 F (37.8 C)  No results for input(s): WBC, CREATININE, LATICACIDVEN, VANCOTROUGH, VANCOPEAK, VANCORANDOM, GENTTROUGH, GENTPEAK, GENTRANDOM, TOBRATROUGH, TOBRAPEAK, TOBRARND, AMIKACINPEAK, AMIKACINTROU, AMIKACIN in the last 168 hours.  CrCl cannot be calculated (Unknown ideal weight.).    Allergies  Allergen Reactions  . Erythromycin     Thank you for allowing pharmacy to be a part of this patient's care.  [place] 09/13/2017 10:27 PM

## 2017-09-13 NOTE — H&P (Signed)
History and Physical    Nicholas [name] FUX:[id number] DOB: 09-Sep-1927 DOA: 09/13/2017  Referring MD/NP/PA:   PCP: Denita Lung, MD   Patient coming from:  The patient is coming from home.  At baseline, pt is independent for most of ADL.   Chief Complaint: right hand pain after dog bite  HPI: Nicholas [name] is a 84 y.o. male with medical history significant of hypertension, depression, essential thrombocytopenia, chronic sinusitis, anemia, CKD-3, BPH, who presents with right hand pain after dog bite.  Pt states that he was bitten by his pet dog yesterday. His right hand becomes swelling and painful today. The right hand is erythematous. The pain is constant, severe, 8 out of 10 in severity, sharp, nonradiating, aggravated with movement. There is some dark colored bloody draining coming out from the site of dog bite. He also has fever of 100 and chills. He states that the dog is current on its rabies vaccinations.patient has some mild cough, but no chest pain, shortness of breath. Denies nausea, vomiting, diarrhea, abdominal pain, symptoms of UTI or unilateral weakness.  ED Course: pt was found to have WBC 19.6, lactic acid 1.79, negative urinalysis, stable renal function, temperature 100, no tachycardia, no tachypnea, oxygen saturation 100% on room air. X-ray of the right hand showed no bony fracture. Pt is admitted to [facility] bed as inpatient. Hand surgeon, Dr. Amedeo Plenty was consulted.  Review of Systems:   General: has fevers, chills, no body weight gain, has fatigue HEENT: no blurry vision, hearing changes or sore throat Respiratory: no dyspnea, has coughing, no wheezing CV: no chest pain, no palpitations GI: no nausea, vomiting, abdominal pain, diarrhea, constipation GU: no dysuria, burning on urination, increased urinary frequency, hematuria  Ext: no leg edema Neuro: no unilateral weakness, numbness, or tingling, no vision change or hearing loss Skin: has right hand swelling and  pain MSK: No muscle spasm, no deformity, no limitation of range of movement in spin Heme: No easy bruising.  Travel history: No recent long distant travel.  Allergy:  Allergies  Allergen Reactions  . Tape Other (See Comments)    The patient's SKIN IS VERY THIN AND TEARS AND BRUISES VERY EASILY!!  . Erythromycin Other (See Comments)    Gums turned red and bled profusely    Past Medical History:  Diagnosis Date  . Abnormal CT scan, sinus 10/04/06   pansinusitis, mildly deviated septum  . Allergy   . Anemia    related to CKD  . Chronic sinusitis   . Dysthymia    dysthymia  . H/O echocardiogram 01/04/2007   LV function normal, 55-60% EF, mildly increased thickenss of LV wall  . H/O echocardiogram 12/2006   mild LV wall thickening, EF 55-60%, LV function normal  . History of MRI of brain and brain stem 02/26/06   sinusitis, nonspecific deep white matter changes normal for age  . Hypertension   . Seborrheic keratosis    sees Toni Arthurs, NP dermatology  . Thrombocytosis [facility])    sees hematology    Past Surgical History:  Procedure Laterality Date  . HERNIA REPAIR     right inguinal repair  . TONSILLECTOMY     childhood  . VARICOSE VEIN SURGERY     right lower leg    Social History:  reports that he quit smoking about 59 years ago. He has never used smokeless tobacco. He reports that he does not drink alcohol or use drugs.  Family History:  Family History  Problem Relation Age of Onset  . Heart disease Father   . Stroke Father   . Cancer Neg Hx   . COPD Neg Hx   . Diabetes Neg Hx      Prior to Admission medications   Medication Sig Start Date End Date Taking? Authorizing Provider  acetaminophen (TYLENOL) 650 MG CR tablet Take 650 mg by mouth at bedtime.   Yes [provider]  aspirin 81 MG tablet Take 1 tablet (81 mg total) by mouth daily. 10/06/16  Yes Tysinger, Camelia Eng, PA-C  cetirizine (ZYRTEC) 10 MG tablet Take 10 mg by mouth at bedtime.   Yes  [provider]  CVS VITAMIN D3 1000 units capsule TAKE 1 TABLET (1,000 UNITS TOTAL) BY MOUTH DAILY. 08/06/16  Yes Tysinger, Camelia Eng, PA-C  FLUoxetine (PROZAC) 10 MG tablet Take 1 tablet (10 mg total) by mouth daily. Patient taking differently: Take 10 mg by mouth at bedtime.  12/06/16  Yes Tysinger, Camelia Eng, PA-C  FLUoxetine (PROZAC) 40 MG capsule Take 1 capsule (40 mg total) by mouth daily. Patient taking differently: Take 40 mg by mouth at bedtime.  12/06/16  Yes Tysinger, Camelia Eng, PA-C  hydroxyurea (HYDREA) 500 MG capsule Take 1 capsule (500 mg total) by mouth daily. May take with food to minimize GI side effects. 07/12/17  Yes Magrinat, Virgie Dad, MD  Multiple Vitamins-Minerals (PRESERVISION AREDS 2) CAPS Take 1 capsule by mouth 2 (two) times daily.   Yes [provider]  Polyethyl Glyc-Propyl Glyc PF (SYSTANE PRESERVATIVE FREE) 0.4-0.3 % SOLN Place 1 drop into both eyes daily as needed (for irritation).   Yes [provider]  sodium chloride (OCEAN) 0.65 % nasal spray Place 1 spray into the nose 4 (four) times daily as needed for congestion.    Yes [provider]  terazosin (HYTRIN) 2 MG capsule Take 1 capsule (2 mg total) by mouth daily. Patient taking differently: Take 2 mg by mouth at bedtime.  10/06/16  Yes Tysinger, Camelia Eng, PA-C  anagrelide (AGRYLIN) 0.5 MG capsule Take 1 capsule (0.5 mg total) by mouth daily. Patient not taking: Reported on 09/13/2017 12/02/16   Magrinat, Virgie Dad, MD  CVS VITAMIN D3 1000 units capsule TAKE 1 TABLET (1,000 UNITS TOTAL) BY MOUTH DAILY. Patient not taking: Reported on 09/13/2017 02/02/17   Carlena Hurl, PA-C    Physical Exam: Vitals:   09/13/17 2135 09/13/17 2145  BP: 132/61 (!) 118/56  Pulse: 85 (!) 58  Resp: 18 15  Temp: 100 F (37.8 C)   TempSrc: Oral   SpO2: 98% 100%   General: Not in acute distress HEENT:       Eyes: PERRL, EOMI, no scleral icterus.       ENT: No discharge from the ears and nose, no  pharynx injection, no tonsillar enlargement.        Neck: No JVD, no bruit, no mass felt. Heme: No neck lymph node enlargement. Cardiac: G6/Y6, RRR, 2/6 systolic murmurs, No gallops or rubs. Respiratory: Good air movement bilaterally. No rales, wheezing, rhonchi or rubs. GI: Soft, nondistended, nontender, no rebound pain, no organomegaly, BS present. GU: No hematuria Ext: No pitting leg edema bilaterally. 2+DP/PT pulse bilaterally. Musculoskeletal: No joint deformities, No joint redness or warmth, no limitation of ROM in spin. Skin: the right hand is swelling, red, warm and tender, worse at the base of right thumb. There is some dark colored bloody draining coming out from the site of dog bite.  Neuro: Alert,  oriented X3, cranial nerves II-XII grossly intact, moves all extremities normally. Psych: Patient is not psychotic, no suicidal or hemocidal ideation.  Labs on Admission: I have personally reviewed following labs and imaging studies  CBC: Recent Labs  Lab 09/13/17 2149  WBC 19.6*  NEUTROABS 17.4*  HGB 10.2*  HCT 31.6*  MCV 110.1*  PLT 902   Basic Metabolic Panel: Recent Labs  Lab 09/13/17 2149  NA 139  K 4.5  CL 107  CO2 21*  GLUCOSE 118*  BUN 28*  CREATININE 1.51*  CALCIUM 8.7*   GFR: CrCl cannot be calculated (Unknown ideal weight.). Liver Function Tests: Recent Labs  Lab 09/13/17 2149  AST 20  ALT 14*  ALKPHOS 71  BILITOT 1.0  PROT 7.1  ALBUMIN 3.6   No results for input(s): LIPASE, AMYLASE in the last 168 hours. No results for input(s): AMMONIA in the last 168 hours. Coagulation Profile: No results for input(s): INR, PROTIME in the last 168 hours. Cardiac Enzymes: No results for input(s): CKTOTAL, CKMB, CKMBINDEX, TROPONINI in the last 168 hours. BNP (last 3 results) No results for input(s): PROBNP in the last 8760 hours. HbA1C: No results for input(s): HGBA1C in the last 72 hours. CBG: No results for input(s): GLUCAP in the last 168  hours. Lipid Profile: No results for input(s): CHOL, HDL, LDLCALC, TRIG, CHOLHDL, LDLDIRECT in the last 72 hours. Thyroid Function Tests: No results for input(s): TSH, T4TOTAL, FREET4, T3FREE, THYROIDAB in the last 72 hours. Anemia Panel: No results for input(s): VITAMINB12, FOLATE, FERRITIN, TIBC, IRON, RETICCTPCT in the last 72 hours. Urine analysis:    Component Value Date/Time   COLORURINE YELLOW 09/13/2017 2136   APPEARANCEUR CLEAR 09/13/2017 2136   LABSPEC 1.016 09/13/2017 2136   PHURINE 7.0 09/13/2017 2136   GLUCOSEU NEGATIVE 09/13/2017 2136   HGBUR NEGATIVE 09/13/2017 2136   BILIRUBINUR NEGATIVE 09/13/2017 2136   BILIRUBINUR n 05/12/2015 1207   [place] NEGATIVE 09/13/2017 2136   PROTEINUR 30 (A) 09/13/2017 2136   UROBILINOGEN negative 05/12/2015 1207   NITRITE NEGATIVE 09/13/2017 2136   LEUKOCYTESUR NEGATIVE 09/13/2017 2136   Sepsis Labs: @LABRCNTIP (procalcitonin:4,lacticidven:4) )No results found for this or any previous visit (from the past 240 hour(s)).   Radiological Exams on Admission: Dg Hand Complete Right  Result Date: 09/13/2017 CLINICAL DATA:  Dog bite to right thumb. EXAM: RIGHT HAND - COMPLETE 3+ VIEW COMPARISON:  None. FINDINGS: No acute fracture or dislocation. Moderate osteoarthritis of the DIP joints and third MCP joint. Mild osteoarthritis of the thumb IP joint, PIP joints, and scaphotrapeziotrapezoid joint. Diffuse osteopenia. Vascular calcifications. IMPRESSION: 1. No acute osseous abnormality. 2. 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 Electronically Signed   By: Titus Dubin M.D.   On: 09/13/2017 23:19     EKG:   Not done in ED, will get one.   Assessment/Plan Principal Problem:   Cellulitis of right hand Active Problems:   Depression   Anemia in chronic kidney disease   Essential hypertension   Essential thrombocytosis (HCC)   BPH (benign prostatic hyperplasia)   CKD (chronic kidney disease) stage 3, GFR 30-59 ml/min (HCC)   Dog  bite of right hand   Sepsis due to Cellulitis of right hand 2/2  dog bite: patient meets criteria for sepsis with leukocytosis and fever. Lactic acid is normal. Currently hemodynamically stable. Hand surgeon, Dr. Amedeo Plenty was consulted-->I&D.   - will admit to med-surg bed as inpt - IV unasyn - PRN Zofran for nausea, and Percocet for pain - Blood cultures x  2  - ESR and CRP - will get Procalcitonin and trend lactic acid levels per sepsis protocol. - IVF: 2.5 L of NS bolus in ED, followed by 75 cc/h  Depression: Stable, no suicidal or homicidal ideations. -Continue home medications: Prozac  Anemia in chronic kidney disease: hgb stable,hgb 10.2 -f/u by CBC  HTN: blood pressure 118/58 -on Terazosin which is for BPH -IV hydralazine prn  Essential thrombocytosis ([place]): platelet 313. F/u with Dr. Nat Math -on hydroxyureacurrently  BPH: stable - Continue Terazosin  CKD (chronic kidney disease) stage 3, GFR 30-59 ml/min ([place]): stable. Baseline creatinine 1.5-1.6. Her creatinine is 1.51 -Follow-up by BMP   DVT ppx: SCD Code Status: Full code Family Communication:   Yes, patient's  dautghter at bed side Disposition Plan:  Anticipate discharge back to previous home environment Consults called:  Copy, Dr. Amedeo Plenty Admission status:  medical floor/inpt  Date of Service 09/14/2017    Ivor Costa [facility] Pager [phone]  If 7PM-7AM, please contact night-coverage [email or web address] Password [facility] 09/14/2017, 12:47 AM

## 2017-09-13 NOTE — ED Triage Notes (Signed)
Pt BIB family, bit on right hand by family dog approx. 24 hours ago. Pt's right hand red, warm to touch, and wound draining. Pt's temp in triage 100. Dog's vaccinations up to date.

## 2017-09-13 NOTE — ED Provider Notes (Signed)
[facility] EMERGENCY DEPARTMENT Provider Note   CSN: [id number] Arrival date & time: 09/13/17  2117     History   Chief Complaint Chief Complaint  Patient presents with  . Animal Bite    HPI Furkan T Imad Shostak is a 84 y.o. male.  Patient presents to the ED with a chief complaint of dog bite.  He states that he was bitten by his pet dog yesterday.  States that today he noticed pain and redness of the skin.  Has not taken anything for the symptoms.  He has some pain with movement and palpation of the area.  He states that the dog is current on its rabies vaccinations.  He denies any other symptoms.  He is noted to have a fever in triage.     Past Medical History:  Diagnosis Date  . Abnormal CT scan, sinus 10/04/06   pansinusitis, mildly deviated septum  . Allergy   . Anemia    related to CKD  . Chronic sinusitis   . Dysthymia    dysthymia  . H/O echocardiogram 01/04/2007   LV function normal, 55-60% EF, mildly increased thickenss of LV wall  . H/O echocardiogram 12/2006   mild LV wall thickening, EF 55-60%, LV function normal  . History of MRI of brain and brain stem 02/26/06   sinusitis, nonspecific deep white matter changes normal for age  . Hypertension   . Seborrheic keratosis    sees Toni Arthurs, NP dermatology  . Thrombocytosis [facility])    sees hematology    Patient Active Problem List   Diagnosis Date Noted  . Splenomegaly 07/12/2017  . Skin lesions 01/26/2017  . Loose stools 01/26/2017  . Senile purpura ([place]) 01/26/2017  . Diarrhea 11/25/2016  . Heart murmur 11/25/2016  . Macular degeneration 10/05/2016  . Memory loss 10/05/2016  . Chronic pain of both knees 10/05/2016  . CKD (chronic kidney disease) stage 3, GFR 30-59 ml/min (HCC) 12/04/2015  . Microalbuminuria 04/29/2015  . Vitamin D deficiency 04/29/2015  . Mixed dyslipidemia 04/29/2015  . Anemia in chronic kidney disease 03/25/2015  . Essential hypertension 03/25/2015  . Essential  thrombocytosis ([place]) 03/25/2015  . Need for prophylactic vaccination and inoculation against influenza 03/25/2015  . Urge incontinence 03/25/2015  . Benign prostatic hyperplasia 03/25/2015  . Seborrheic keratoses 03/25/2015  . Hearing loss 03/25/2015  . History of fall 03/25/2015  . Varicose veins of legs 03/25/2015  . Dysthymia 10/21/2010    Past Surgical History:  Procedure Laterality Date  . HERNIA REPAIR     right inguinal repair  . TONSILLECTOMY     childhood  . VARICOSE VEIN SURGERY     right lower leg        Home Medications    Prior to Admission medications   Medication Sig Start Date End Date Taking? Authorizing Provider  acetaminophen (TYLENOL) 650 MG CR tablet Take 650 mg by mouth at bedtime.   Yes [provider]  aspirin 81 MG tablet Take 1 tablet (81 mg total) by mouth daily. 10/06/16  Yes Tysinger, Camelia Eng, PA-C  cetirizine (ZYRTEC) 10 MG tablet Take 10 mg by mouth at bedtime.   Yes [provider]  [employer] VITAMIN D3 1000 units capsule TAKE 1 TABLET (1,000 UNITS TOTAL) BY MOUTH DAILY. 08/06/16  Yes Tysinger, Camelia Eng, PA-C  FLUoxetine (PROZAC) 10 MG tablet Take 1 tablet (10 mg total) by mouth daily. Patient taking differently: Take 10 mg by mouth at bedtime.  12/06/16  Yes Tysinger, Camelia Eng, PA-C  FLUoxetine (PROZAC) 40 MG capsule Take 1 capsule (40 mg total) by mouth daily. Patient taking differently: Take 40 mg by mouth at bedtime.  12/06/16  Yes Tysinger, Camelia Eng, PA-C  hydroxyurea (HYDREA) 500 MG capsule Take 1 capsule (500 mg total) by mouth daily. May take with food to minimize GI side effects. 07/12/17  Yes Magrinat, Virgie Dad, MD  Multiple Vitamins-Minerals (PRESERVISION AREDS 2) CAPS Take 1 capsule by mouth 2 (two) times daily.   Yes [provider]  Polyethyl Glyc-Propyl Glyc PF (SYSTANE PRESERVATIVE FREE) 0.4-0.3 % SOLN Place 1 drop into both eyes daily as needed (for irritation).   Yes [provider]  sodium chloride (OCEAN)  0.65 % nasal spray Place 1 spray into the nose 4 (four) times daily as needed for congestion.    Yes [provider]  terazosin (HYTRIN) 2 MG capsule Take 1 capsule (2 mg total) by mouth daily. Patient taking differently: Take 2 mg by mouth at bedtime.  10/06/16  Yes Tysinger, Camelia Eng, PA-C  anagrelide (AGRYLIN) 0.5 MG capsule Take 1 capsule (0.5 mg total) by mouth daily. Patient not taking: Reported on 09/13/2017 12/02/16   Magrinat, Virgie Dad, MD  CVS VITAMIN D3 1000 units capsule TAKE 1 TABLET (1,000 UNITS TOTAL) BY MOUTH DAILY. Patient not taking: Reported on 09/13/2017 02/02/17   Tysinger, Camelia Eng, PA-C    Family History Family History  Problem Relation Age of Onset  . Heart disease Father   . Stroke Father   . Cancer Neg Hx   . COPD Neg Hx   . Diabetes Neg Hx     Social History Social History   Tobacco Use  . Smoking status: Former Smoker    Last attempt to quit: 06/14/1958    Years since quitting: 59.2  . Smokeless tobacco: Never Used  Substance Use Topics  . Alcohol use: No  . Drug use: No     Allergies   Tape and Erythromycin   Review of Systems Review of Systems  All other systems reviewed and are negative.    Physical Exam Updated Vital Signs BP 132/61 (BP Location: Right Arm)   Pulse 85   Temp 100 F (37.8 C) (Oral)   Resp 18   SpO2 98%   Physical Exam  Constitutional: He is oriented to person, place, and time. He appears well-developed and well-nourished.  HENT:  Head: Normocephalic and atraumatic.  Eyes: Pupils are equal, round, and reactive to light. Conjunctivae and EOM are normal. Right eye exhibits no discharge. Left eye exhibits no discharge. No scleral icterus.  Neck: Normal range of motion. Neck supple. No JVD present.  Cardiovascular: Normal rate, regular rhythm and normal heart sounds. Exam reveals no gallop and no friction rub.  No murmur heard. Pulmonary/Chest: Effort normal and breath sounds normal. No respiratory distress. He has  no wheezes. He has no rales. He exhibits no tenderness.  Abdominal: Soft. He exhibits no distension and no mass. There is no tenderness. There is no rebound and no guarding.  Musculoskeletal: Normal range of motion. He exhibits no edema or tenderness.  ROM and strength of right thumb is 4/5 limited by pain, right wrist is 5/5, no palpable fluid collection  Neurological: He is alert and oriented to person, place, and time.  Skin: Skin is warm and dry. There is erythema.  Erythema about the right thumb at the base posteriorly, mild erythema on anterior wrist  Psychiatric: He has a normal mood  and affect. His behavior is normal. Judgment and thought content normal.  Nursing note and vitals reviewed.    ED Treatments / Results  Labs (all labs ordered are listed, but only abnormal results are displayed) Labs Reviewed  CBC WITH DIFFERENTIAL/PLATELET - Abnormal; Notable for the following components:      Result Value   WBC 19.6 (*)    RBC 2.87 (*)    Hemoglobin 10.2 (*)    HCT 31.6 (*)    MCV 110.1 (*)    MCH 35.5 (*)    RDW 23.3 (*)    All other components within normal limits  URINALYSIS, ROUTINE W REFLEX MICROSCOPIC - Abnormal; Notable for the following components:   Protein, ur 30 (*)    Squamous Epithelial / LPF 0-5 (*)    All other components within normal limits  COMPREHENSIVE METABOLIC PANEL  I-STAT CG4 LACTIC ACID, ED    EKG None  Radiology No results found.  Procedures Procedures (including critical care time)  Medications Ordered in ED Medications  acetaminophen (TYLENOL) tablet 1,000 mg (has no administration in time range)  ampicillin-sulbactam (UNASYN) 1.5 g in sodium chloride 0.9 % 100 mL IVPB (has no administration in time range)  Tdap (BOOSTRIX) injection 0.5 mL (has no administration in time range)     Initial Impression / Assessment and Plan / ED Course  I have reviewed the triage vital signs and the nursing notes.  Pertinent labs & imaging results  that were available during my care of the patient were reviewed by me and considered in my medical decision making (see chart for details).     Patient with dog bite yesterday.  Has cellulitis now. Fever to 100.  WBC is elevated at 19.6.  No sign of abscess.  Able to range the thumb and wrist.  Doubt tenosynovitis.  Seen by and discussed with Dr. Ralene Bathe, who recommends admission for IV antibiotics.  Will start Unasyn.    Appreciate Dr. Amedeo Plenty for seeing the patient.    Appreciate Dr. Blaine Hamper for admitting the patient.  Final Clinical Impressions(s) / ED Diagnoses   Final diagnoses:  Dog bite, initial encounter  Cellulitis of right upper extremity    ED Discharge Orders    None       Montine Circle, PA-C 09/14/17 0036    Quintella Reichert, MD 09/17/17 1153

## 2017-09-14 ENCOUNTER — Other Ambulatory Visit: Payer: Self-pay

## 2017-09-14 DIAGNOSIS — R197 Diarrhea, unspecified: Secondary | ICD-10-CM | POA: Diagnosis not present

## 2017-09-14 DIAGNOSIS — A419 Sepsis, unspecified organism: Secondary | ICD-10-CM | POA: Diagnosis present

## 2017-09-14 DIAGNOSIS — M65141 Other infective (teno)synovitis, right hand: Secondary | ICD-10-CM | POA: Diagnosis not present

## 2017-09-14 DIAGNOSIS — Z23 Encounter for immunization: Secondary | ICD-10-CM | POA: Diagnosis not present

## 2017-09-14 DIAGNOSIS — Z7982 Long term (current) use of aspirin: Secondary | ICD-10-CM | POA: Diagnosis not present

## 2017-09-14 DIAGNOSIS — H3530 Unspecified macular degeneration: Secondary | ICD-10-CM | POA: Diagnosis present

## 2017-09-14 DIAGNOSIS — H9190 Unspecified hearing loss, unspecified ear: Secondary | ICD-10-CM | POA: Diagnosis present

## 2017-09-14 DIAGNOSIS — E440 Moderate protein-calorie malnutrition: Secondary | ICD-10-CM | POA: Diagnosis present

## 2017-09-14 DIAGNOSIS — I129 Hypertensive chronic kidney disease with stage 1 through stage 4 chronic kidney disease, or unspecified chronic kidney disease: Secondary | ICD-10-CM | POA: Diagnosis present

## 2017-09-14 DIAGNOSIS — F329 Major depressive disorder, single episode, unspecified: Secondary | ICD-10-CM | POA: Diagnosis not present

## 2017-09-14 DIAGNOSIS — L03113 Cellulitis of right upper limb: Secondary | ICD-10-CM | POA: Diagnosis not present

## 2017-09-14 DIAGNOSIS — T8141XA Infection following a procedure, superficial incisional surgical site, initial encounter: Secondary | ICD-10-CM | POA: Diagnosis not present

## 2017-09-14 DIAGNOSIS — S61451A Open bite of right hand, initial encounter: Secondary | ICD-10-CM | POA: Diagnosis present

## 2017-09-14 DIAGNOSIS — J329 Chronic sinusitis, unspecified: Secondary | ICD-10-CM | POA: Diagnosis present

## 2017-09-14 DIAGNOSIS — M65131 Other infective (teno)synovitis, right wrist: Secondary | ICD-10-CM | POA: Diagnosis present

## 2017-09-14 DIAGNOSIS — E559 Vitamin D deficiency, unspecified: Secondary | ICD-10-CM | POA: Diagnosis present

## 2017-09-14 DIAGNOSIS — W540XXD Bitten by dog, subsequent encounter: Secondary | ICD-10-CM | POA: Diagnosis not present

## 2017-09-14 DIAGNOSIS — L03011 Cellulitis of right finger: Secondary | ICD-10-CM | POA: Diagnosis not present

## 2017-09-14 DIAGNOSIS — R161 Splenomegaly, not elsewhere classified: Secondary | ICD-10-CM | POA: Diagnosis present

## 2017-09-14 DIAGNOSIS — Z881 Allergy status to other antibiotic agents status: Secondary | ICD-10-CM | POA: Diagnosis not present

## 2017-09-14 DIAGNOSIS — Z87891 Personal history of nicotine dependence: Secondary | ICD-10-CM | POA: Diagnosis not present

## 2017-09-14 DIAGNOSIS — I10 Essential (primary) hypertension: Secondary | ICD-10-CM | POA: Diagnosis not present

## 2017-09-14 DIAGNOSIS — N400 Enlarged prostate without lower urinary tract symptoms: Secondary | ICD-10-CM | POA: Diagnosis not present

## 2017-09-14 DIAGNOSIS — S61451D Open bite of right hand, subsequent encounter: Secondary | ICD-10-CM | POA: Diagnosis not present

## 2017-09-14 DIAGNOSIS — S61051A Open bite of right thumb without damage to nail, initial encounter: Secondary | ICD-10-CM | POA: Diagnosis present

## 2017-09-14 DIAGNOSIS — W540XXA Bitten by dog, initial encounter: Secondary | ICD-10-CM | POA: Diagnosis not present

## 2017-09-14 DIAGNOSIS — D631 Anemia in chronic kidney disease: Secondary | ICD-10-CM | POA: Diagnosis not present

## 2017-09-14 DIAGNOSIS — L821 Other seborrheic keratosis: Secondary | ICD-10-CM | POA: Diagnosis present

## 2017-09-14 DIAGNOSIS — N183 Chronic kidney disease, stage 3 (moderate): Secondary | ICD-10-CM | POA: Diagnosis not present

## 2017-09-14 DIAGNOSIS — Z8249 Family history of ischemic heart disease and other diseases of the circulatory system: Secondary | ICD-10-CM | POA: Diagnosis not present

## 2017-09-14 DIAGNOSIS — Z79899 Other long term (current) drug therapy: Secondary | ICD-10-CM | POA: Diagnosis not present

## 2017-09-14 DIAGNOSIS — M65841 Other synovitis and tenosynovitis, right hand: Secondary | ICD-10-CM | POA: Diagnosis not present

## 2017-09-14 DIAGNOSIS — D473 Essential (hemorrhagic) thrombocythemia: Secondary | ICD-10-CM | POA: Diagnosis not present

## 2017-09-14 LAB — LACTIC ACID, PLASMA: LACTIC ACID, VENOUS: 1.1 mmol/L (ref 0.5–1.9)

## 2017-09-14 LAB — BASIC METABOLIC PANEL
ANION GAP: 14 (ref 5–15)
BUN: 28 mg/dL — ABNORMAL HIGH (ref 6–20)
CHLORIDE: 106 mmol/L (ref 101–111)
CO2: 18 mmol/L — AB (ref 22–32)
Calcium: 8.4 mg/dL — ABNORMAL LOW (ref 8.9–10.3)
Creatinine, Ser: 1.43 mg/dL — ABNORMAL HIGH (ref 0.61–1.24)
GFR calc non Af Amer: 42 mL/min — ABNORMAL LOW (ref >60)
GFR, EST AFRICAN AMERICAN: 49 mL/min — AB (ref >60)
GLUCOSE: 114 mg/dL — AB (ref 65–99)
POTASSIUM: 4.4 mmol/L (ref 3.5–5.1)
Sodium: 138 mmol/L (ref 135–145)

## 2017-09-14 LAB — CBC
HEMATOCRIT: 29.2 % — AB (ref 39.0–52.0)
Hemoglobin: 9.3 g/dL — ABNORMAL LOW (ref 13.0–17.0)
MCH: 34.6 pg — AB (ref 26.0–34.0)
MCHC: 31.8 g/dL (ref 30.0–36.0)
MCV: 108.6 fL — AB (ref 78.0–100.0)
Platelets: 261 10*3/uL (ref 150–400)
RBC: 2.69 MIL/uL — AB (ref 4.22–5.81)
RDW: 22.6 % — ABNORMAL HIGH (ref 11.5–15.5)
WBC: 15.8 10*3/uL — ABNORMAL HIGH (ref 4.0–10.5)

## 2017-09-14 LAB — PROTIME-INR
INR: 1.25
Prothrombin Time: 15.6 seconds — ABNORMAL HIGH (ref 11.4–15.2)

## 2017-09-14 LAB — C-REACTIVE PROTEIN: CRP: 3.1 mg/dL — ABNORMAL HIGH (ref <1.0)

## 2017-09-14 LAB — PROCALCITONIN: Procalcitonin: 0.12 ng/mL

## 2017-09-14 LAB — SEDIMENTATION RATE: Sed Rate: 67 mm/hr — ABNORMAL HIGH (ref 0–16)

## 2017-09-14 MED ORDER — ACETAMINOPHEN 325 MG PO TABS
650.0000 mg | ORAL_TABLET | Freq: Four times a day (QID) | ORAL | Status: DC | PRN
  Administered 2017-09-14: 650 mg via ORAL
  Filled 2017-09-14: qty 2

## 2017-09-14 MED ORDER — ONDANSETRON HCL 4 MG/2ML IJ SOLN: 4.0000 mg | Freq: Three times a day (TID) | INTRAMUSCULAR | Status: DC | PRN

## 2017-09-14 MED ORDER — TERAZOSIN HCL 2 MG PO CAPS
2.0000 mg | ORAL_CAPSULE | Freq: Every day | ORAL | Status: DC
  Administered 2017-09-14 – 2017-09-18 (×6): 2 mg via ORAL
  Filled 2017-09-14 (×6): qty 1

## 2017-09-14 MED ORDER — LIDOCAINE HCL (PF) 1 % IJ SOLN
30.0000 mL | Freq: Once | INTRAMUSCULAR | Status: AC
  Administered 2017-09-14: 30 mL via INTRADERMAL
  Filled 2017-09-14: qty 30

## 2017-09-14 MED ORDER — SODIUM CHLORIDE 0.9 % IV BOLUS: 2500.0000 mL | Freq: Once | INTRAVENOUS | Status: DC

## 2017-09-14 MED ORDER — POLYETHYLENE GLYCOL 3350 17 G PO PACK: 17.0000 g | PACK | Freq: Every day | ORAL | Status: DC | PRN

## 2017-09-14 MED ORDER — OXYCODONE-ACETAMINOPHEN 5-325 MG PO TABS
1.0000 | ORAL_TABLET | ORAL | Status: DC | PRN
  Administered 2017-09-16 – 2017-09-18 (×7): 1 via ORAL
  Filled 2017-09-14 (×7): qty 1

## 2017-09-14 MED ORDER — ASPIRIN EC 81 MG PO TBEC
81.0000 mg | DELAYED_RELEASE_TABLET | Freq: Every day | ORAL | Status: DC
  Administered 2017-09-14 – 2017-09-19 (×6): 81 mg via ORAL
  Filled 2017-09-14 (×6): qty 1

## 2017-09-14 MED ORDER — LORATADINE 10 MG PO TABS
10.0000 mg | ORAL_TABLET | Freq: Every day | ORAL | Status: DC
  Administered 2017-09-14 – 2017-09-19 (×6): 10 mg via ORAL
  Filled 2017-09-14 (×6): qty 1

## 2017-09-14 MED ORDER — SODIUM CHLORIDE 0.9 % IV SOLN
INTRAVENOUS | Status: DC
  Administered 2017-09-14 – 2017-09-15 (×3): via INTRAVENOUS

## 2017-09-14 MED ORDER — HYDRALAZINE HCL 20 MG/ML IJ SOLN: 5.0000 mg | INTRAMUSCULAR | Status: DC | PRN

## 2017-09-14 MED ORDER — IBUPROFEN 400 MG PO TABS
400.0000 mg | ORAL_TABLET | Freq: Four times a day (QID) | ORAL | Status: DC | PRN
  Administered 2017-09-14 – 2017-09-19 (×6): 400 mg via ORAL
  Filled 2017-09-14 (×6): qty 1

## 2017-09-14 MED ORDER — HYDROXYUREA 500 MG PO CAPS
500.0000 mg | ORAL_CAPSULE | Freq: Every day | ORAL | Status: DC
  Administered 2017-09-14 – 2017-09-19 (×6): 500 mg via ORAL
  Filled 2017-09-14 (×6): qty 1

## 2017-09-14 MED ORDER — PROSIGHT PO TABS
1.0000 | ORAL_TABLET | Freq: Every day | ORAL | Status: DC
  Administered 2017-09-14 – 2017-09-19 (×6): 1 via ORAL
  Filled 2017-09-14 (×6): qty 1

## 2017-09-14 MED ORDER — SALINE SPRAY 0.65 % NA SOLN: 1.0000 | Freq: Four times a day (QID) | NASAL | Status: DC | PRN

## 2017-09-14 MED ORDER — ZOLPIDEM TARTRATE 5 MG PO TABS
5.0000 mg | ORAL_TABLET | Freq: Every evening | ORAL | Status: DC | PRN
  Administered 2017-09-17 – 2017-09-18 (×3): 5 mg via ORAL
  Filled 2017-09-14 (×3): qty 1

## 2017-09-14 MED ORDER — FLUOXETINE HCL 20 MG PO CAPS
50.0000 mg | ORAL_CAPSULE | Freq: Every day | ORAL | Status: DC
  Administered 2017-09-14 – 2017-09-18 (×6): 50 mg via ORAL
  Filled 2017-09-14 (×6): qty 1

## 2017-09-14 MED ORDER — FLUOXETINE HCL 40 MG PO CAPS: 40.0000 mg | ORAL_CAPSULE | Freq: Every day | ORAL | Status: DC

## 2017-09-14 MED ORDER — SACCHAROMYCES BOULARDII 250 MG PO CAPS
250.0000 mg | ORAL_CAPSULE | Freq: Two times a day (BID) | ORAL | Status: DC
  Administered 2017-09-14 – 2017-09-19 (×11): 250 mg via ORAL
  Filled 2017-09-14 (×12): qty 1

## 2017-09-14 MED ORDER — POLYVINYL ALCOHOL 1.4 % OP SOLN: 1.0000 [drp] | Freq: Every day | OPHTHALMIC | Status: DC | PRN

## 2017-09-14 MED ORDER — FLUOXETINE HCL 20 MG PO TABS: 10.0000 mg | ORAL_TABLET | Freq: Every day | ORAL | Status: DC

## 2017-09-14 NOTE — Consult Note (Signed)
Reason for Consult: Dog bite right hand Referring Physician: ER staff  Nicholas [name] is an 84 y.o. male.  HPI: 84 year old male who is bit greater than 24 hours ago by his dog.  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  It has a tendency to need it he states.  Patient denies neck back chest or abdominal pain.  He lives with his wife who has dementia.  He is here with his daughter today who is very helpful with the history.  Patient is retired from the [employer].  He lives at home as mentioned.  He denies other complaints.  He states he has had redness developing over the last 24 hours which is ascended to the distal forearm.  He complains of thumb pain as well where the entrance occurred.  The entrance is just proximal to the MCP joint with surrounding cellulitis.  I have reviewed this at length.  I reviewed his notes in detail.  Past Medical History:  Diagnosis Date  . Abnormal CT scan, sinus 10/04/06   pansinusitis, mildly deviated septum  . Allergy   . Anemia    related to CKD  . Chronic sinusitis   . Dysthymia    dysthymia  . H/O echocardiogram 01/04/2007   LV function normal, 55-60% EF, mildly increased thickenss of LV wall  . H/O echocardiogram 12/2006   mild LV wall thickening, EF 55-60%, LV function normal  . History of MRI of brain and brain stem 02/26/06   sinusitis, nonspecific deep white matter changes normal for age  . Hypertension   . Seborrheic keratosis    sees Toni Arthurs, NP dermatology  . Thrombocytosis [facility])    sees hematology    Past Surgical History:  Procedure Laterality Date  . HERNIA REPAIR     right inguinal repair  . TONSILLECTOMY     childhood  . VARICOSE VEIN SURGERY     right lower leg    Family History  Problem Relation Age of Onset  . Heart disease Father   . Stroke Father   . Cancer Neg Hx   . COPD Neg Hx   . Diabetes Neg Hx     Social History:  reports that he quit smoking about 59 years ago. He has never used smokeless tobacco.  He reports that he does not drink alcohol or use drugs.  Allergies:  Allergies  Allergen Reactions  . Tape Other (See Comments)    The patient's SKIN IS VERY THIN AND TEARS AND BRUISES VERY EASILY!!  . Erythromycin Other (See Comments)    Gums turned red and bled profusely    Medications: I have reviewed the patient's current medications.  Results for orders placed or performed during the hospital encounter of 09/13/17 (from the past 48 hour(s))  Urinalysis, Routine w reflex microscopic     Status: Abnormal   Collection Time: 09/13/17  9:36 PM  Result Value Ref Range   Color, Urine YELLOW YELLOW   APPearance CLEAR CLEAR   Specific Gravity, Urine 1.016 1.005 - 1.030   pH 7.0 5.0 - 8.0   Glucose, UA NEGATIVE NEGATIVE mg/dL   Hgb urine dipstick NEGATIVE NEGATIVE   Bilirubin Urine NEGATIVE NEGATIVE   Ketones, ur NEGATIVE NEGATIVE mg/dL   Protein, ur 30 (A) NEGATIVE mg/dL   Nitrite NEGATIVE NEGATIVE   Leukocytes, UA NEGATIVE NEGATIVE   RBC / HPF 0-5 0 - 5 RBC/hpf   WBC, UA 0-5 0 - 5 WBC/hpf   Bacteria, UA  NONE SEEN NONE SEEN   Squamous Epithelial / LPF 0-5 (A) NONE SEEN    Comment: Performed at [facility], [place] [address] [place], [place] [address]  Comprehensive metabolic panel     Status: Abnormal   Collection Time: 09/13/17  9:49 PM  Result Value Ref Range   Sodium 139 135 - 145 mmol/L   Potassium 4.5 3.5 - 5.1 mmol/L   Chloride 107 101 - 111 mmol/L   CO2 21 (L) 22 - 32 mmol/L   Glucose, Bld 118 (H) 65 - 99 mg/dL   BUN 28 (H) 6 - 20 mg/dL   Creatinine, Ser 1.51 (H) 0.61 - 1.24 mg/dL   Calcium 8.7 (L) 8.9 - 10.3 mg/dL   Total Protein 7.1 6.5 - 8.1 g/dL   Albumin 3.6 3.5 - 5.0 g/dL   AST 20 15 - 41 U/L   ALT 14 (L) 17 - 63 U/L   Alkaline Phosphatase 71 38 - 126 U/L   Total Bilirubin 1.0 0.3 - 1.2 mg/dL   GFR calc non Af Amer 40 (L) >60 mL/min   GFR calc Af Amer 46 (L) >60 mL/min    Comment: (NOTE) The eGFR has been calculated using the CKD EPI equation. This  calculation has not been validated in all clinical situations. eGFR's persistently <60 mL/min signify possible Chronic Kidney Disease.    Anion gap 11 5 - 15    Comment: Performed at [facility] [address], [place], [place] [address]  CBC with Differential     Status: Abnormal   Collection Time: 09/13/17  9:49 PM  Result Value Ref Range   WBC 19.6 (H) 4.0 - 10.5 K/uL   RBC 2.87 (L) 4.22 - 5.81 MIL/uL   Hemoglobin 10.2 (L) 13.0 - 17.0 g/dL   HCT 31.6 (L) 39.0 - 52.0 %   MCV 110.1 (H) 78.0 - 100.0 fL   MCH 35.5 (H) 26.0 - 34.0 pg   MCHC 32.3 30.0 - 36.0 g/dL   RDW 23.3 (H) 11.5 - 15.5 %   Platelets 313 150 - 400 K/uL   Neutrophils Relative % 89 %   Lymphocytes Relative 3 %   Monocytes Relative 8 %   Eosinophils Relative 0 %   Basophils Relative 0 %   Band Neutrophils 0 %   Metamyelocytes Relative 0 %   Myelocytes 0 %   Promyelocytes Absolute 0 %   Blasts 0 %   nRBC 0 0 /100 WBC   Other 0 %   Neutro Abs 17.4 (H) 1.7 - 7.7 K/uL   Lymphs Abs 0.6 (L) 0.7 - 4.0 K/uL   Monocytes Absolute 1.6 (H) 0.1 - 1.0 K/uL   Eosinophils Absolute 0.0 0.0 - 0.7 K/uL   Basophils Absolute 0.0 0.0 - 0.1 K/uL   RBC Morphology POLYCHROMASIA PRESENT    WBC Morphology TOXIC GRANULATION     Comment: Performed at [facility], [place] [address] [place], [place] [address]  I-Stat CG4 Lactic Acid, ED     Status: None   Collection Time: 09/13/17 10:29 PM  Result Value Ref Range   Lactic Acid, Venous 1.79 0.5 - 1.9 mmol/L    Dg Hand Complete Right  Result Date: 09/13/2017 CLINICAL DATA:  Dog bite to right thumb. EXAM: RIGHT HAND - COMPLETE 3+ VIEW COMPARISON:  None. FINDINGS: No acute fracture or dislocation. Moderate osteoarthritis of the DIP joints and third MCP joint. Mild osteoarthritis of the thumb IP joint, PIP joints, and  scaphotrapeziotrapezoid joint. Diffuse osteopenia. Vascular calcifications. IMPRESSION: 1. No acute osseous abnormality. 2. 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 Electronically Signed   By: Titus Dubin M.D.   On: 09/13/2017 23:19    Review of Systems  Respiratory: Negative.   Cardiovascular: Negative.   Gastrointestinal: Negative.   Genitourinary: Negative.    Blood pressure (!) 118/56, pulse (!) 58, temperature 100 F (37.8 C), temperature source Oral, resp. rate 15, SpO2 100 %. Physical Exam  Pleasant male alert and oriented he has cellulitis and erythema about the dorsal aspect of his hand and thumb there is an entrance bite just proximal to the MCP joint of the thumb.  There is no evidence of instability.  There is no evidence of an obvious fracture.  I have reviewed his x-rays at length.  Lower extremity examination is benign left upper extremity is benign.  Chest is clear HEENT is within normal limits and his abdomen is nontender.  We reviewed this at length and his findings.  Assessment/Plan: Dog bite dorsal aspect of the thumb with surrounding cellulitis.  I reviewed this with patient at length and the findings.  I have recommended irrigation and debridement and he will be admitted by the hospitalist.  Procedure patient was given a field block with lidocaine.  He was scrubbed with Betadine scrub followed by saline wash.  We then performed irrigation debridement of skin subcutanrous tissue extensive in nature patient tolerated this well there were no complicating features  Following this the patient then had the wound packed.  I treated the skin with Neosporin and Xeroform gauze as well as a large wrap.  Once again this was an irrigation debridement skin subtenons tissue secondary to a dog bite this was excisional nature with curette knife and scissor.  I will wait 24 hours before unwrapping this.  Patient understands that it will be Thursday morning when I look at his thumb and assess the cellulitis which hopefully will be resolved.  I would recommend continue IV Unasyn.  Patient understands this.  Is a pleasure to see this  patient tonight he is a nice gentleman hopefully we can eradicate the symptoms quickly Willa Frater III 09/14/2017, 12:59 AM

## 2017-09-14 NOTE — Progress Notes (Signed)
Progress Note    Nicholas [name]  YSA:[id number] DOB: 1927-10-28  DOA: 09/13/2017 PCP: Denita Lung, MD    Brief Narrative:   Chief complaint: Follow-up right hand cellulitis status post dog bite  Medical records reviewed and are as summarized below:  Nicholas [name] is an 84 y.o. male with a PMH of hypertension, depression, essential thrombocytopenia, chronic sinusitis, anemia, stage III CK D, and BPH who was admitted 09/13/17 after suffering from a dog bite resulting in fever, chills, and increased pain/swelling of his right hand.  Assessment/Plan:   Principal Problem:   Sepsis/Cellulitis of right hand secondary to dog bite Plain films of the right hand were negative for fracture. Underwent an incision and drainage procedure by the hand surgeon with the wound packed postoperatively. The bandages will stay intact until 09/15/17. Remains febrile with elevated WBC. Continue Unasyn. Had one episode of diarrhea. Place on probiotics.  Active Problems:   Depression Continue Prozac.    Anemia in chronic kidney disease Hemoglobin stable.    Essential hypertension On Terazosin for BPH which causes lowering of the blood pressure. Blood pressure reasonable.    Essential thrombocytosis (HCC) On chronic hydroxyurea.    BPH (benign prostatic hyperplasia) On Terazosin.    CKD (chronic kidney disease) stage 3, GFR 30-59 ml/min (HCC) Baseline creatinine 1.5-1.6. Current creatinine consistent with usual baseline values.  Body mass index is 26.15 kg/m.   Family Communication/Anticipated D/C date and plan/Code Status   DVT prophylaxis: Lovenox ordered. Code Status: Full Code.  Family Communication: Daughter at the bedside. Disposition Plan: Likely home in 24 hours.   Medical Consultants:    Hand Surgery   Anti-Infectives:    Unasyn 09/13/17--->  Subjective:   Patient is a bit sleepy today. He has had one episode of diarrhea. No nausea or vomiting. No current  complaints of hand pain.  Objective:    Vitals:   09/13/17 2135 09/13/17 2145 09/14/17 0223 09/14/17 0508  BP: 132/61 (!) 118/56 (!) 153/64 124/62  Pulse: 85 (!) 58 64 60  Resp: 18 15 17 16   Temp: 100 F (37.8 C)  98 F (36.7 C) 98.4 F (36.9 C)  TempSrc: Oral  Oral Oral  SpO2: 98% 100% 98% 99%  Weight:   78 kg (171 lb 15.3 oz)   Height:   5\' 8"  (1.727 m)     Intake/Output Summary (Last 24 hours) at 09/14/2017 0842 Last data filed at 09/14/2017 0636 Gross per 24 hour  Intake 495 ml  Output -  Net 495 ml   Filed Weights   09/14/17 0223  Weight: 78 kg (171 lb 15.3 oz)    Exam: General: No acute distress. Sleepy. Cardiovascular: Heart sounds show a regular rate, and rhythm. No gallops or rubs. No murmurs. No JVD. Lungs: Clear to auscultation bilaterally with good air movement. No rales, rhonchi or wheezes. Abdomen: Soft, nontender, nondistended with normal active bowel sounds. No masses. No hepatosplenomegaly. Neurological: Alert and oriented 3. Moves all extremities 4 with equal strength. Cranial nerves II through XII grossly intact. Skin: Warm and dry. No rashes or lesions. Extremities: No clubbing or cyanosis. No edema. Pedal pulses 2+. Right hand wrapped in an Ace wrap/bandages, postoperative dressings not removed. Psychiatric: Mood and affect are normal. Insight and judgment are fair.   Data Reviewed:   I have personally reviewed following labs and imaging studies:  Labs: Labs show the following:   Basic Metabolic Panel: Recent Labs  Lab 09/13/17 2149  09/14/17 0413  NA 139 138  K 4.5 4.4  CL 107 106  CO2 21* 18*  GLUCOSE 118* 114*  BUN 28* 28*  CREATININE 1.51* 1.43*  CALCIUM 8.7* 8.4*   GFR Estimated Creatinine Clearance: 35.2 mL/min (A) (by C-G formula based on SCr of 1.43 mg/dL (H)). Liver Function Tests: Recent Labs  Lab 09/13/17 2149  AST 20  ALT 14*  ALKPHOS 71  BILITOT 1.0  PROT 7.1  ALBUMIN 3.6   Coagulation profile Recent Labs    Lab 09/14/17 0116  INR 1.25    CBC: Recent Labs  Lab 09/13/17 2149 09/14/17 0413  WBC 19.6* 15.8*  NEUTROABS 17.4*  --   HGB 10.2* 9.3*  HCT 31.6* 29.2*  MCV 110.1* 108.6*  PLT 313 261   Sepsis Labs: Recent Labs  Lab 09/13/17 2149 09/13/17 2229 09/14/17 0116 09/14/17 0117 09/14/17 0413  PROCALCITON  --   --  0.12  --   --   WBC 19.6*  --   --   --  15.8*  LATICACIDVEN  --  1.79  --  1.1  --     Microbiology No results found for this or any previous visit (from the past 240 hour(s)).  Procedures and diagnostic studies:  Dg Hand Complete Right  Result Date: 09/13/2017 CLINICAL DATA:  Dog bite to right thumb. EXAM: RIGHT HAND - COMPLETE 3+ VIEW COMPARISON:  None. FINDINGS: No acute fracture or dislocation. Moderate osteoarthritis of the DIP joints and third MCP joint. Mild osteoarthritis of the thumb IP joint, PIP joints, and scaphotrapeziotrapezoid joint. Diffuse osteopenia. Vascular calcifications. IMPRESSION: 1. No acute osseous abnormality. 2. 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 Electronically Signed   By: Titus Dubin M.D.   On: 09/13/2017 23:19    Medications:   . aspirin EC  81 mg Oral Daily  . FLUoxetine  50 mg Oral QHS  . hydroxyurea  500 mg Oral Q breakfast  . loratadine  10 mg Oral Daily  . multivitamin  1 tablet Oral Daily  . terazosin  2 mg Oral QHS   Continuous Infusions: . sodium chloride 75 mL/hr at 09/14/17 0240  . ampicillin-sulbactam (UNASYN) IV Stopped (09/14/17 0600)  . sodium chloride       LOS: 0 days   Jacquelynn Cree  [facility] Pager [phone]. If unable to reach me by pager, please call my cell phone at [phone].  *Please refer to [email or web address], password [email or web address] to get updated schedule on who will round on this patient, as hospitalists switch teams weekly. If 7PM-7AM, please contact night-coverage at [email or web address], password [email or web address] for any overnight needs.  09/14/2017, 8:42 AM

## 2017-09-14 NOTE — ED Notes (Signed)
Patient is stable and ready to be transport to the floor at this time.  Report was called to 6N RN.  Belongings taken with the patient to the floor.   

## 2017-09-14 NOTE — ED Notes (Signed)
Per RN, 2nd I stat lactic is not needed .

## 2017-09-15 LAB — CBC
HCT: 27.8 % — ABNORMAL LOW (ref 39.0–52.0)
Hemoglobin: 9 g/dL — ABNORMAL LOW (ref 13.0–17.0)
MCH: 35.6 pg — ABNORMAL HIGH (ref 26.0–34.0)
MCHC: 32.4 g/dL (ref 30.0–36.0)
MCV: 109.9 fL — AB (ref 78.0–100.0)
PLATELETS: 233 10*3/uL (ref 150–400)
RBC: 2.53 MIL/uL — ABNORMAL LOW (ref 4.22–5.81)
RDW: 23 % — AB (ref 11.5–15.5)
WBC: 12.3 10*3/uL — AB (ref 4.0–10.5)

## 2017-09-15 LAB — BASIC METABOLIC PANEL
Anion gap: 9 (ref 5–15)
BUN: 23 mg/dL — AB (ref 6–20)
CHLORIDE: 108 mmol/L (ref 101–111)
CO2: 20 mmol/L — ABNORMAL LOW (ref 22–32)
CREATININE: 1.35 mg/dL — AB (ref 0.61–1.24)
Calcium: 8 mg/dL — ABNORMAL LOW (ref 8.9–10.3)
GFR calc Af Amer: 53 mL/min — ABNORMAL LOW (ref >60)
GFR calc non Af Amer: 46 mL/min — ABNORMAL LOW (ref >60)
Glucose, Bld: 84 mg/dL (ref 65–99)
Potassium: 4.1 mmol/L (ref 3.5–5.1)
Sodium: 137 mmol/L (ref 135–145)

## 2017-09-15 NOTE — Consult Note (Signed)
Patient is seen and examined at bedside. Patient has no signs of elbow shoulder or upper arm pain but still has some residual cellulitic change.  His white count decreased from 19-15. I have taken his bandage off and performed an irrigation and debridement procedure at bedside.  Patient  carefully had the bandage removed without difficulty. Following this we performed a  irrigation and debridement.  Procedure note: Patient underwent thorough prep and sterile field application. Following this with combination knife blade scissor tip and curette we performed irrigation and debridement of skin and subcutaneous tissue. Deep tissue was also irrigated and removed. Patient tolerated this well. Copious  amounts of saline were placed in the wound. There is approximately a liter of irrigant applied to the wound. Once again this was an excisional debridement of skin subcutaneous tissue as well as associated deep tissue about the wound.  Following this the patient was packed with wet-to-dry gauze. We'll continue bedside irrigation and debridement and dressing changes in an aggressive fashion.  Patient tolerated the irrigation and debridement procedure without difficulty.  The patient understands the necessity of strict wound care antibiotics and other measures. Oftentimes wounds will take days to declare themselves. We'll do everything in our power to try and ensure uneventful healing.  At present time given the presentation of pleural fluid I would recommend continued IV antibiotics and a dressing change by myself tomorrow.  I feel that he hopefully will turn the corner but given the declaration with a small abscess focus and need for I&D and culture today I would like to keep him in the hospital until he is completely stable.  We will call his daughters and let them know.  Otherwise he is anxious to go home and without complicating feature.  There is no DVT there is no left arm problems.  He requests that he  has less IV fluid as he has to use the bathroom a lot.  We will await his cultures continue aggressive wound management and move forward accordingly.  I discussed with him that infections are difficult entities and that we truly sometimes have to chase infections.  In his situation his infection continues to persist and has accumulated some degree of purulent material in the wound which we cultured today and aggressively I&D.  Given these issues continued IV antibiotics and cautious observation is the rule.We have discussed with the patient the issues regarding their infection to the extremity. We will continue antibiotics and await culture results. Often times it will take 3-5 days for cultures to become final. During this time we will typically have the patient on intravenous antibiotics until we can find a parenteral route of antibiotic regime specific for the bacteria or organism isolated. We have discussed with the patient the need for daily irrigation and debridement as well as therapy to the area. We have discussed with the patient the necessity of range of motion to the involved joints as discussed today. We have discussed with the patient the unpredictability of infections at times. We'll continue to work towards good pain control and restoration of function. The patient understands the need for meticulous wound care and the necessity of proper followup.  The possible complications of stiffness (loss of motion), resistant infection, possible deep bone infection, possible chronic pain issues, possible need for multiple surgeries and even amputation.  With this in mind the patient understands our goal is to eradicate the infection to quiesence. We will continue to work towards these goals.  All questions have  been addressed    Amedeo Plenty MD

## 2017-09-15 NOTE — Progress Notes (Signed)
Patient ID: Nicholas [name], male   DOB: 11-Dec-1927, 84 y.o.   MRN: [id number]  PROGRESS NOTE    LOWERY PAULLIN  [id number] DOB: 1927/12/15 DOA: 09/13/2017 PCP: Denita Lung, MD   Brief Narrative:  84 year old male with history of hypertension, depression, essential thrombocytosis, anemia, chronic kidney disease stage III and BPH presented on 09/13/2017 after suffering from a dog bite resulting in fever, chills and increased pain/swelling of his right hand.  He was started on Unasyn.  Hand surgery was consulted.   Assessment & Plan:   Principal Problem:   Cellulitis of right hand Active Problems:   Depression   Anemia in chronic kidney disease   Essential hypertension   Essential thrombocytosis (HCC)   BPH (benign prostatic hyperplasia)   CKD (chronic kidney disease) stage 3, GFR 30-59 ml/min (HCC)   Dog bite of right hand   Sepsis secondary to cellulitis -Improved.  Hemodynamically stable.  Blood cultures negative so far.  Wound cultures are pending.  Antibiotic plan as below.  Discontinue IV fluids  Right hand cellulitis after dog bite -Hand surgery following: Status post irrigation and debridement -Continue Unasyn.  Wound cultures are pending -Continue pain management  Leukocytosis -Probably secondary to above.  Improving.  Repeat a.m. Labs  Essential hypertension -Blood pressure stable.  Continue terazosin  Essential thrombocytosis -On chronic hydroxyurea.  Platelets stable  BPH -Continue terazosin  Anemia of chronic disease -Hemoglobin stable  Chronic kidney disease stage III -Creatinine stable.  Repeat a.m. labs    DVT prophylaxis: Lovenox Code Status: Full Family Communication:  daughter at bedside Disposition Plan: Home once cleared by hand surgery in the next 1-2 days  Consultants: Hand surgery  Procedures: Bedside irrigation and debridement performed by hand surgery  Antimicrobials: Unasyn from 09/13/2017 onwards   Subjective: Patient  seen and examined at bedside.  He feels better.  His hand pain is improving.  No overnight fever, nausea or vomiting.  Objective: Vitals:   09/14/17 0508 09/14/17 1425 09/14/17 2210 09/15/17 0505  BP: 124/62 (!) 121/47 (!) 161/82 (!) 113/56  Pulse: 60 63 69 63  Resp: 16 16 16 17   Temp: 98.4 F (36.9 C) 98.3 F (36.8 C) 98.9 F (37.2 C) 98.4 F (36.9 C)  TempSrc: Oral Oral Oral Oral  SpO2: 99% 98% 99% 98%  Weight:      Height:        Intake/Output Summary (Last 24 hours) at 09/15/2017 1418 Last data filed at 09/15/2017 0900 Gross per 24 hour  Intake 1650 ml  Output 1050 ml  Net 600 ml   Filed Weights   09/14/17 0223  Weight: 78 kg (171 lb 15.3 oz)    Examination:  General exam: Appears calm and comfortable  Respiratory system: Bilateral decreased breath sound at bases Cardiovascular system: S1 & S2 heard, rate controlled  gastrointestinal system: Abdomen is nondistended, soft and nontender. Normal bowel sounds heard. Extremities: No cyanosis; right hand and forearm dressing present   Data Reviewed: I have personally reviewed following labs and imaging studies  CBC: Recent Labs  Lab 09/13/17 2149 09/14/17 0413 09/15/17 0631  WBC 19.6* 15.8* 12.3*  NEUTROABS 17.4*  --   --   HGB 10.2* 9.3* 9.0*  HCT 31.6* 29.2* 27.8*  MCV 110.1* 108.6* 109.9*  PLT 313 261 573   Basic Metabolic Panel: Recent Labs  Lab 09/13/17 2149 09/14/17 0413 09/15/17 0631  NA 139 138 137  K 4.5 4.4 4.1  CL 107 106 108  CO2 21* 18* 20*  GLUCOSE 118* 114* 84  BUN 28* 28* 23*  CREATININE 1.51* 1.43* 1.35*  CALCIUM 8.7* 8.4* 8.0*   GFR: Estimated Creatinine Clearance: 37.3 mL/min (A) (by C-G formula based on SCr of 1.35 mg/dL (H)). Liver Function Tests: Recent Labs  Lab 09/13/17 2149  AST 20  ALT 14*  ALKPHOS 71  BILITOT 1.0  PROT 7.1  ALBUMIN 3.6   No results for input(s): LIPASE, AMYLASE in the last 168 hours. No results for input(s): AMMONIA in the last 168  hours. Coagulation Profile: Recent Labs  Lab 09/14/17 0116  INR 1.25   Cardiac Enzymes: No results for input(s): CKTOTAL, CKMB, CKMBINDEX, TROPONINI in the last 168 hours. BNP (last 3 results) No results for input(s): PROBNP in the last 8760 hours. HbA1C: No results for input(s): HGBA1C in the last 72 hours. CBG: No results for input(s): GLUCAP in the last 168 hours. Lipid Profile: No results for input(s): CHOL, HDL, LDLCALC, TRIG, CHOLHDL, LDLDIRECT in the last 72 hours. Thyroid Function Tests: No results for input(s): TSH, T4TOTAL, FREET4, T3FREE, THYROIDAB in the last 72 hours. Anemia Panel: No results for input(s): VITAMINB12, FOLATE, FERRITIN, TIBC, IRON, RETICCTPCT in the last 72 hours. Sepsis Labs: Recent Labs  Lab 09/13/17 2229 09/14/17 0116 09/14/17 0117  PROCALCITON  --  0.12  --   LATICACIDVEN 1.79  --  1.1    Recent Results (from the past 240 hour(s))  Culture, blood (x 2)     Status: None (Preliminary result)   Collection Time: 09/14/17 12:30 AM  Result Value Ref Range Status   Specimen Description BLOOD RIGHT ARM  Final   Special Requests   Final    BOTTLES DRAWN AEROBIC AND ANAEROBIC Blood Culture results may not be optimal due to an excessive volume of blood received in culture bottles   Culture   Final    NO GROWTH 1 DAY Performed at [facility] [address] [place], [place] [address]    Report Status PENDING  Incomplete  Culture, blood (x 2)     Status: None (Preliminary result)   Collection Time: 09/14/17  1:14 AM  Result Value Ref Range Status   Specimen Description BLOOD LEFT HAND  Final   Special Requests   Final    IN PEDIATRIC BOTTLE Blood Culture results may not be optimal due to an excessive volume of blood received in culture bottles   Culture   Final    NO GROWTH 1 DAY Performed at [facility], [place] [address], [place], [place] [address]    Report Status PENDING  Incomplete  Aerobic Culture (superficial specimen)      Status: None (Preliminary result)   Collection Time: 09/15/17 10:18 AM  Result Value Ref Range Status   Specimen Description WOUND HAND RIGHT  Final   Special Requests NONE  Final   Gram Stain   Final    FEW WBC PRESENT, PREDOMINANTLY PMN MODERATE GRAM POSITIVE COCCI MODERATE GRAM NEGATIVE RODS MODERATE GRAM POSITIVE RODS Performed at [facility], [place] [address], [place], [place] [address]    Culture PENDING  Incomplete   Report Status PENDING  Incomplete         Radiology Studies: Dg Hand Complete Right  Result Date: 09/13/2017 CLINICAL DATA:  Dog bite to right thumb. EXAM: RIGHT HAND - COMPLETE 3+ VIEW COMPARISON:  None. FINDINGS: No acute fracture or dislocation. Moderate osteoarthritis of the DIP joints and third MCP joint. Mild  osteoarthritis of the thumb IP joint, PIP joints, and scaphotrapeziotrapezoid joint. Diffuse osteopenia. Vascular calcifications. IMPRESSION: 1. No acute osseous abnormality. 2. 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 Electronically Signed   By: Titus Dubin M.D.   On: 09/13/2017 23:19        Scheduled Meds: . aspirin EC  81 mg Oral Daily  . FLUoxetine  50 mg Oral QHS  . hydroxyurea  500 mg Oral Q breakfast  . loratadine  10 mg Oral Daily  . multivitamin  1 tablet Oral Daily  . saccharomyces boulardii  250 mg Oral BID  . terazosin  2 mg Oral QHS   Continuous Infusions: . sodium chloride 75 mL/hr at 09/15/17 1332  . ampicillin-sulbactam (UNASYN) IV Stopped (09/15/17 1113)  . sodium chloride       LOS: 1 day        Aline August, MD [facility] Pager [phone]  If 7PM-7AM, please contact night-coverage [email or web address] Password [email or web address] 09/15/2017, 2:18 PM

## 2017-09-15 NOTE — Progress Notes (Signed)
Initial Nutrition Assessment  DOCUMENTATION CODES:   Non-severe (moderate) malnutrition in context of social or environmental circumstances  INTERVENTION:   -Continue MVI daily -Ensure Enlive po BID, each supplement provides 350 kcal and 20 grams of protein  NUTRITION DIAGNOSIS:   Moderate Malnutrition related to social / environmental circumstances as evidenced by mild fat depletion, moderate fat depletion, mild muscle depletion, moderate muscle depletion.  GOAL:   Patient will meet greater than or equal to 90% of their needs  MONITOR:   PO intake, Supplement acceptance, Labs, Weight trends, Skin, I & O's  REASON FOR ASSESSMENT:   Malnutrition Screening Tool    ASSESSMENT:   Nicholas [name] is a 84 y.o. male with medical history significant of hypertension, depression, essential thrombocytopenia, chronic sinusitis, anemia, CKD-3, BPH, who presents with right hand pain after dog bite.  Pt admitted with sepsis due to cellulitis of rt hand secondary to dog bite.   4/4- s/p bedside I&D of right hand  Spoke with pt and daughter at bedside. Pt reports "everything was going really well, up until this happened". Pt reports he ate about half of his breakfast (noted meal completion 100%). Pt consumes 3 meals per day (Breakfast: cereal, Lunch: hamburger, sandwich, or pizza, Dinner: meat, starch, and vegetables from [employer]). Per pt daughter, pt has lost a lost of weight progressive over the past 4 years, related to increased caretaking responsibilities for his wife with dementia.   Pt reports UBW is around 200#. Noted wt has been stable over the past 2 years.   Discussed importance of good nutrition to promote healing. Pt amenable to MVI and Ensure supplements. Encouraged pt to also take at home, which pt daughter is receptive to.   Labs reviewed.   NUTRITION - FOCUSED PHYSICAL EXAM:    Most Recent Value  Orbital Region  Mild depletion  Upper Arm Region  Moderate depletion   Thoracic and Lumbar Region  No depletion  Buccal Region  Mild depletion  Temple Region  Mild depletion  Clavicle Bone Region  Mild depletion  Clavicle and Acromion Bone Region  Mild depletion  Scapular Bone Region  Mild depletion  Dorsal Hand  Moderate depletion  Patellar Region  Mild depletion  Anterior Thigh Region  Mild depletion  Posterior Calf Region  Moderate depletion  Edema (RD Assessment)  None  Hair  Reviewed  Eyes  Reviewed  Mouth  Reviewed  Skin  Reviewed  Nails  Reviewed       Diet Order:  Diet Heart Room service appropriate? Yes; Fluid consistency: Thin  EDUCATION NEEDS:   Education needs have been addressed  Skin:  Skin Assessment: Skin Integrity Issues: Skin Integrity Issues:: Incisions Incisions: rt hand  Last BM:  09/14/17  Height:   Ht Readings from Last 1 Encounters:  09/14/17 5\' 8"  (1.727 m)    Weight:   Wt Readings from Last 1 Encounters:  09/14/17 171 lb 15.3 oz (78 kg)    Ideal Body Weight:  70 kg  BMI:  Body mass index is 26.15 kg/m.  Estimated Nutritional Needs:   Kcal:  1950-2150  Protein:  95-110 grams  Fluid:  1.9-2.1 L    Aerika Groll A. Jimmye Norman, RD, LDN, CDE Pager: [phone] After hours Pager: [phone]

## 2017-09-16 ENCOUNTER — Encounter (HOSPITAL_COMMUNITY)
Admission: EM | Disposition: A | Discharge status: Home Health | Payer: Self-pay | Source: Home / Self Care | Attending: Internal Medicine

## 2017-09-16 ENCOUNTER — Inpatient Hospital Stay (HOSPITAL_COMMUNITY): Payer: Medicare Other | Admitting: Anesthesiology

## 2017-09-16 ENCOUNTER — Encounter (HOSPITAL_COMMUNITY): Payer: Self-pay | Admitting: Surgery

## 2017-09-16 DIAGNOSIS — L03113 Cellulitis of right upper limb: Secondary | ICD-10-CM

## 2017-09-16 DIAGNOSIS — W540XXD Bitten by dog, subsequent encounter: Secondary | ICD-10-CM

## 2017-09-16 DIAGNOSIS — N183 Chronic kidney disease, stage 3 (moderate): Secondary | ICD-10-CM

## 2017-09-16 DIAGNOSIS — S61451D Open bite of right hand, subsequent encounter: Secondary | ICD-10-CM

## 2017-09-16 DIAGNOSIS — D473 Essential (hemorrhagic) thrombocythemia: Secondary | ICD-10-CM

## 2017-09-16 DIAGNOSIS — I10 Essential (primary) hypertension: Secondary | ICD-10-CM

## 2017-09-16 DIAGNOSIS — E440 Moderate protein-calorie malnutrition: Secondary | ICD-10-CM

## 2017-09-16 DIAGNOSIS — F329 Major depressive disorder, single episode, unspecified: Secondary | ICD-10-CM

## 2017-09-16 DIAGNOSIS — N400 Enlarged prostate without lower urinary tract symptoms: Secondary | ICD-10-CM

## 2017-09-16 DIAGNOSIS — D631 Anemia in chronic kidney disease: Secondary | ICD-10-CM

## 2017-09-16 HISTORY — PX: I&D EXTREMITY: SHX5045

## 2017-09-16 LAB — IRON AND TIBC
IRON: 66 ug/dL (ref 45–182)
SATURATION RATIOS: 26 % (ref 17.9–39.5)
TIBC: 255 ug/dL (ref 250–450)
UIBC: 189 ug/dL

## 2017-09-16 LAB — CBC WITH DIFFERENTIAL/PLATELET
BASOS ABS: 0.1 10*3/uL (ref 0.0–0.1)
Basophils Relative: 1 %
EOS PCT: 1 %
Eosinophils Absolute: 0.1 10*3/uL (ref 0.0–0.7)
HEMATOCRIT: 30.1 % — AB (ref 39.0–52.0)
Hemoglobin: 9.4 g/dL — ABNORMAL LOW (ref 13.0–17.0)
Lymphocytes Relative: 17 %
Lymphs Abs: 1.9 10*3/uL (ref 0.7–4.0)
MCH: 33.6 pg (ref 26.0–34.0)
MCHC: 31.2 g/dL (ref 30.0–36.0)
MCV: 107.5 fL — AB (ref 78.0–100.0)
MONOS PCT: 14 %
Monocytes Absolute: 1.6 10*3/uL — ABNORMAL HIGH (ref 0.1–1.0)
NEUTROS ABS: 7.5 10*3/uL (ref 1.7–7.7)
Neutrophils Relative %: 67 %
Platelets: 228 10*3/uL (ref 150–400)
RBC: 2.8 MIL/uL — ABNORMAL LOW (ref 4.22–5.81)
RDW: 21.8 % — AB (ref 11.5–15.5)
WBC: 11.2 10*3/uL — ABNORMAL HIGH (ref 4.0–10.5)

## 2017-09-16 LAB — BASIC METABOLIC PANEL
Anion gap: 10 (ref 5–15)
BUN: 24 mg/dL — AB (ref 6–20)
CALCIUM: 8.3 mg/dL — AB (ref 8.9–10.3)
CO2: 20 mmol/L — AB (ref 22–32)
CREATININE: 1.4 mg/dL — AB (ref 0.61–1.24)
Chloride: 108 mmol/L (ref 101–111)
GFR calc Af Amer: 51 mL/min — ABNORMAL LOW (ref >60)
GFR calc non Af Amer: 44 mL/min — ABNORMAL LOW (ref >60)
GLUCOSE: 88 mg/dL (ref 65–99)
Potassium: 3.8 mmol/L (ref 3.5–5.1)
Sodium: 138 mmol/L (ref 135–145)

## 2017-09-16 LAB — RETICULOCYTES
RBC.: 2.88 MIL/uL — ABNORMAL LOW (ref 4.22–5.81)
Retic Count, Absolute: 60.5 10*3/uL (ref 19.0–186.0)
Retic Ct Pct: 2.1 % (ref 0.4–3.1)

## 2017-09-16 LAB — FOLATE: FOLATE: 15.4 ng/mL (ref >5.9)

## 2017-09-16 LAB — VITAMIN B12: Vitamin B-12: 558 pg/mL (ref 180–914)

## 2017-09-16 LAB — MAGNESIUM: Magnesium: 2.1 mg/dL (ref 1.7–2.4)

## 2017-09-16 LAB — FERRITIN: FERRITIN: 289 ng/mL (ref 24–336)

## 2017-09-16 LAB — MRSA PCR SCREENING: MRSA by PCR: NEGATIVE

## 2017-09-16 SURGERY — IRRIGATION AND DEBRIDEMENT EXTREMITY
Anesthesia: General | Site: Wrist | Laterality: Right

## 2017-09-16 MED ORDER — DEXAMETHASONE SODIUM PHOSPHATE 10 MG/ML IJ SOLN
INTRAMUSCULAR | Status: AC
  Filled 2017-09-16: qty 1

## 2017-09-16 MED ORDER — FENTANYL CITRATE (PF) 100 MCG/2ML IJ SOLN: 25.0000 ug | INTRAMUSCULAR | Status: DC | PRN

## 2017-09-16 MED ORDER — SODIUM CHLORIDE 0.9 % IR SOLN
Status: DC | PRN
Start: 2017-09-16 — End: 2017-09-16
  Administered 2017-09-16: 1000 mL

## 2017-09-16 MED ORDER — EPHEDRINE 5 MG/ML INJ
INTRAVENOUS | Status: AC
  Filled 2017-09-16: qty 10

## 2017-09-16 MED ORDER — BACITRACIN-NEOMYCIN-POLYMYXIN 400-5-5000 EX OINT
TOPICAL_OINTMENT | CUTANEOUS | Status: AC
  Filled 2017-09-16: qty 1

## 2017-09-16 MED ORDER — FENTANYL CITRATE (PF) 250 MCG/5ML IJ SOLN
INTRAMUSCULAR | Status: AC
  Filled 2017-09-16: qty 5

## 2017-09-16 MED ORDER — DEXAMETHASONE SODIUM PHOSPHATE 10 MG/ML IJ SOLN
INTRAMUSCULAR | Status: DC | PRN
  Administered 2017-09-16: 5 mg via INTRAVENOUS

## 2017-09-16 MED ORDER — EPHEDRINE SULFATE-NACL 50-0.9 MG/10ML-% IV SOSY
PREFILLED_SYRINGE | INTRAVENOUS | Status: DC | PRN
  Administered 2017-09-16 (×4): 10 mg via INTRAVENOUS

## 2017-09-16 MED ORDER — BUPIVACAINE HCL (PF) 0.25 % IJ SOLN
INTRAMUSCULAR | Status: AC
  Filled 2017-09-16: qty 30

## 2017-09-16 MED ORDER — ACETAMINOPHEN 10 MG/ML IV SOLN
INTRAVENOUS | Status: DC | PRN
  Administered 2017-09-16: 1000 mg via INTRAVENOUS

## 2017-09-16 MED ORDER — BACITRACIN-NEOMYCIN-POLYMYXIN OINTMENT TUBE
TOPICAL_OINTMENT | CUTANEOUS | Status: DC | PRN
  Administered 2017-09-16: 1 via TOPICAL

## 2017-09-16 MED ORDER — LACTATED RINGERS IV SOLN
INTRAVENOUS | Status: DC
Start: 2017-09-16 — End: 2017-09-19
  Administered 2017-09-16: 17:00:00 via INTRAVENOUS

## 2017-09-16 MED ORDER — LIDOCAINE 2% (20 MG/ML) 5 ML SYRINGE
INTRAMUSCULAR | Status: DC | PRN
  Administered 2017-09-16: 80 mg via INTRAVENOUS

## 2017-09-16 MED ORDER — PROPOFOL 10 MG/ML IV BOLUS
INTRAVENOUS | Status: AC
  Filled 2017-09-16: qty 20

## 2017-09-16 MED ORDER — ONDANSETRON HCL 4 MG/2ML IJ SOLN: 4.0000 mg | Freq: Once | INTRAMUSCULAR | Status: DC | PRN

## 2017-09-16 MED ORDER — ONDANSETRON HCL 4 MG/2ML IJ SOLN
INTRAMUSCULAR | Status: DC | PRN
  Administered 2017-09-16: 4 mg via INTRAVENOUS

## 2017-09-16 MED ORDER — PROPOFOL 10 MG/ML IV BOLUS
INTRAVENOUS | Status: DC | PRN
  Administered 2017-09-16: 100 mg via INTRAVENOUS

## 2017-09-16 MED ORDER — ACETAMINOPHEN 10 MG/ML IV SOLN
INTRAVENOUS | Status: AC
  Filled 2017-09-16: qty 100

## 2017-09-16 MED ORDER — ONDANSETRON HCL 4 MG/2ML IJ SOLN
INTRAMUSCULAR | Status: AC
  Filled 2017-09-16: qty 2

## 2017-09-16 MED ORDER — SODIUM CHLORIDE 0.9 % IR SOLN
Status: DC | PRN
  Administered 2017-09-16: 3000 mL

## 2017-09-16 MED ORDER — FENTANYL CITRATE (PF) 250 MCG/5ML IJ SOLN
INTRAMUSCULAR | Status: DC | PRN
  Administered 2017-09-16: 50 ug via INTRAVENOUS
  Administered 2017-09-16 (×3): 25 ug via INTRAVENOUS

## 2017-09-16 MED ORDER — PHENYLEPHRINE 40 MCG/ML (10ML) SYRINGE FOR IV PUSH (FOR BLOOD PRESSURE SUPPORT)
PREFILLED_SYRINGE | INTRAVENOUS | Status: AC
  Filled 2017-09-16: qty 20

## 2017-09-16 MED ORDER — BUPIVACAINE HCL (PF) 0.25 % IJ SOLN: INTRAMUSCULAR | Status: DC | PRN

## 2017-09-16 SURGICAL SUPPLY — 42 items
BANDAGE ACE 4X5 VEL STRL LF (GAUZE/BANDAGES/DRESSINGS) ×3 IMPLANT
BNDG CONFORM 2 STRL LF (GAUZE/BANDAGES/DRESSINGS) IMPLANT
BNDG GAUZE ELAST 4 BULKY (GAUZE/BANDAGES/DRESSINGS) ×3 IMPLANT
CORDS BIPOLAR (ELECTRODE) ×3 IMPLANT
COVER SURGICAL LIGHT HANDLE (MISCELLANEOUS) ×3 IMPLANT
CUFF TOURNIQUET SINGLE 18IN (TOURNIQUET CUFF) ×3 IMPLANT
CUFF TOURNIQUET SINGLE 24IN (TOURNIQUET CUFF) IMPLANT
DRSG ADAPTIC 3X8 NADH LF (GAUZE/BANDAGES/DRESSINGS) ×3 IMPLANT
DRSG MEPITEL 4X7.2 (GAUZE/BANDAGES/DRESSINGS) ×3 IMPLANT
GAUZE SPONGE 4X4 12PLY STRL (GAUZE/BANDAGES/DRESSINGS) ×3 IMPLANT
GAUZE XEROFORM 1X8 LF (GAUZE/BANDAGES/DRESSINGS) ×3 IMPLANT
GAUZE XEROFORM 5X9 LF (GAUZE/BANDAGES/DRESSINGS) ×3 IMPLANT
GLOVE BIOGEL M 8.0 STRL (GLOVE) ×3 IMPLANT
GLOVE SS BIOGEL STRL SZ 8 (GLOVE) ×1 IMPLANT
GLOVE SUPERSENSE BIOGEL SZ 8 (GLOVE) ×2
GOWN STRL REUS W/ TWL LRG LVL3 (GOWN DISPOSABLE) ×1 IMPLANT
GOWN STRL REUS W/ TWL XL LVL3 (GOWN DISPOSABLE) ×2 IMPLANT
GOWN STRL REUS W/TWL LRG LVL3 (GOWN DISPOSABLE) ×2
GOWN STRL REUS W/TWL XL LVL3 (GOWN DISPOSABLE) ×4
KIT BASIN OR (CUSTOM PROCEDURE TRAY) ×3 IMPLANT
KIT TURNOVER KIT B (KITS) ×3 IMPLANT
LOOP VESSEL MAXI BLUE (MISCELLANEOUS) ×3 IMPLANT
MANIFOLD NEPTUNE II (INSTRUMENTS) ×3 IMPLANT
NEEDLE HYPO 25GX1X1/2 BEV (NEEDLE) IMPLANT
NS IRRIG 1000ML POUR BTL (IV SOLUTION) ×3 IMPLANT
PACK ORTHO EXTREMITY (CUSTOM PROCEDURE TRAY) ×3 IMPLANT
PAD ARMBOARD 7.5X6 YLW CONV (MISCELLANEOUS) ×3 IMPLANT
PAD CAST 4YDX4 CTTN HI CHSV (CAST SUPPLIES) ×1 IMPLANT
PADDING CAST COTTON 4X4 STRL (CAST SUPPLIES) ×2
SCRUB BETADINE 4OZ XXX (MISCELLANEOUS) ×3 IMPLANT
SOL PREP POV-IOD 4OZ 10% (MISCELLANEOUS) ×3 IMPLANT
SPLINT FIBERGLASS 3X12 (CAST SUPPLIES) ×3 IMPLANT
SPONGE LAP 4X18 X RAY DECT (DISPOSABLE) ×3 IMPLANT
SUT PROLENE 5 0 PS 2 (SUTURE) ×6 IMPLANT
SWAB CULTURE ESWAB REG 1ML (MISCELLANEOUS) IMPLANT
SYR CONTROL 10ML LL (SYRINGE) IMPLANT
TOWEL OR 17X24 6PK STRL BLUE (TOWEL DISPOSABLE) ×3 IMPLANT
TOWEL OR 17X26 10 PK STRL BLUE (TOWEL DISPOSABLE) ×3 IMPLANT
TUBE CONNECTING 12'X1/4 (SUCTIONS) ×1
TUBE CONNECTING 12X1/4 (SUCTIONS) ×2 IMPLANT
WATER STERILE IRR 1000ML POUR (IV SOLUTION) ×3 IMPLANT
YANKAUER SUCT BULB TIP NO VENT (SUCTIONS) ×3 IMPLANT

## 2017-09-16 NOTE — Progress Notes (Signed)
PROGRESS NOTE    Nicholas [name]  GQQ:[id number] DOB: Sep 12, 1927 DOA: 09/13/2017 PCP: Denita Lung, MD   Brief Narrative:  84 year old male with history of hypertension, depression, essential thrombocytosis, anemia, chronic kidney disease stage III and BPH presented on 09/13/2017 after suffering from a dog bite resulting in fever, chills and increased pain/swelling of his right hand.  He was started on Unasyn.  Hand surgery was consulted.  Patient underwent bedside irrigation and debridement x2 and culture sent off per hand surgeon, Dr. Amedeo Plenty.  Patient to OR for formal irrigation and debridement today 09/16/2017.     Assessment & Plan:   Principal Problem:   Cellulitis of right hand Active Problems:   Depression   Anemia in chronic kidney disease   Essential hypertension   Essential thrombocytosis (HCC)   BPH (benign prostatic hyperplasia)   CKD (chronic kidney disease) stage 3, GFR 30-59 ml/min (HCC)   Dog bite of right hand   Malnutrition of moderate degree  #1 sepsis secondary to right hand cellulitis after dog bite Slowly clinically improving.  Patient afebrile.  WBC trending down.  Patient status post bedside irrigation and debridement x2 by hand surgeon, Dr. Amedeo Plenty.  Patient seen by Dr. Amedeo Plenty this morning for dressing change wound was examined and thick purulent material emanating from wound was noted despite irrigation and debridement x2 is felt patient has had a reaccumulation of infection.  Hand improving with some erythema in the forearm.  Dressing change per hand surgeon was done this morning.  Patient to be taken to the operating room for formal irrigation and debridement per hand surgeon today.  Cultures pending.  Continue empiric IV Unasyn.  2.  Leukocytosis Secondary to problem #1.  WBC trending down.  Wound cultures pending.  Continue empiric IV Unasyn.  3.  Hypertension Stable.  Continue terazosin.  4.  Essential thrombocytosis Platelet count improved and  currently at 228.  Continue hydroxyurea.  5.  Chronic kidney disease stage III Baseline creatinine 1.5-1.6.  Currently close to baseline.  Monitor closely.  6.  BPH Continue terazosin.  7.  Anemia of chronic kidney disease H&H stable.  8.  Depression Continue Prozac.     DVT prophylaxis: SCDs Code Status: Full Family Communication: Updated patient and granddaughter at bedside. Disposition Plan: Home once medically stable, clinically improved, cultures have resulted and per hand surgeon.   Consultants:   Hand surgery: Dr. Amedeo Plenty 09/14/2017  Procedures:   Bedside irrigation and debridement x2 per Dr. Amedeo Plenty  Plain films of the right hand 09/13/2017  Antimicrobials:   IV Unasyn 09/13/2017   Subjective: Patient sitting up in chair.  Less pain in the right hand.  Patient states he is hungry.  No nausea, no vomiting, no abdominal pain.  Patient states he is urinating too much.  Objective: Vitals:   09/15/17 0505 09/15/17 1448 09/15/17 2211 09/16/17 0443  BP: (!) 113/56 (!) 130/57 (!) 141/63 (!) 119/53  Pulse: 63 78 62 60  Resp: 17 16 18 19   Temp: 98.4 F (36.9 C) 99.1 F (37.3 C) 98.4 F (36.9 C) 98.3 F (36.8 C)  TempSrc: Oral Oral Oral Oral  SpO2: 98% 97% 100% 99%  Weight:      Height:        Intake/Output Summary (Last 24 hours) at 09/16/2017 1019 Last data filed at 09/15/2017 1839 Gross per 24 hour  Intake 400 ml  Output -  Net 400 ml   Filed Weights   09/14/17 0223  Weight: 78  kg (171 lb 15.3 oz)    Examination:  General exam: Appears calm and comfortable  Respiratory system: Clear to auscultation. Respiratory effort normal. Cardiovascular system: S1 & S2 heard, RRR. No JVD, murmurs, rubs, gallops or clicks. No pedal edema. Gastrointestinal system: Abdomen is nondistended, soft and nontender. No organomegaly or masses felt. Normal bowel sounds heard. Central nervous system: Alert and oriented. No focal neurological . Extremities: Right upper extremity  with hand bandaged and some erythema in the forearm. 5 x 5 power. Skin: No rashes, lesions or ulcers Psychiatry: Judgement and insight appear normal. Mood & affect appropriate.     Data Reviewed: I have personally reviewed following labs and imaging studies  CBC: Recent Labs  Lab 09/13/17 2149 09/14/17 0413 09/15/17 0631 09/16/17 0627  WBC 19.6* 15.8* 12.3* 11.2*  NEUTROABS 17.4*  --   --  7.5  HGB 10.2* 9.3* 9.0* 9.4*  HCT 31.6* 29.2* 27.8* 30.1*  MCV 110.1* 108.6* 109.9* 107.5*  PLT 313 261 233 458   Basic Metabolic Panel: Recent Labs  Lab 09/13/17 2149 09/14/17 0413 09/15/17 0631 09/16/17 0627  NA 139 138 137 138  K 4.5 4.4 4.1 3.8  CL 107 106 108 108  CO2 21* 18* 20* 20*  GLUCOSE 118* 114* 84 88  BUN 28* 28* 23* 24*  CREATININE 1.51* 1.43* 1.35* 1.40*  CALCIUM 8.7* 8.4* 8.0* 8.3*  MG  --   --   --  2.1   GFR: Estimated Creatinine Clearance: 36 mL/min (A) (by C-G formula based on SCr of 1.4 mg/dL (H)). Liver Function Tests: Recent Labs  Lab 09/13/17 2149  AST 20  ALT 14*  ALKPHOS 71  BILITOT 1.0  PROT 7.1  ALBUMIN 3.6   No results for input(s): LIPASE, AMYLASE in the last 168 hours. No results for input(s): AMMONIA in the last 168 hours. Coagulation Profile: Recent Labs  Lab 09/14/17 0116  INR 1.25   Cardiac Enzymes: No results for input(s): CKTOTAL, CKMB, CKMBINDEX, TROPONINI in the last 168 hours. BNP (last 3 results) No results for input(s): PROBNP in the last 8760 hours. HbA1C: No results for input(s): HGBA1C in the last 72 hours. CBG: No results for input(s): GLUCAP in the last 168 hours. Lipid Profile: No results for input(s): CHOL, HDL, LDLCALC, TRIG, CHOLHDL, LDLDIRECT in the last 72 hours. Thyroid Function Tests: No results for input(s): TSH, T4TOTAL, FREET4, T3FREE, THYROIDAB in the last 72 hours. Anemia Panel: No results for input(s): VITAMINB12, FOLATE, FERRITIN, TIBC, IRON, RETICCTPCT in the last 72 hours. Sepsis Labs: Recent  Labs  Lab 09/13/17 2229 09/14/17 0116 09/14/17 0117  PROCALCITON  --  0.12  --   LATICACIDVEN 1.79  --  1.1    Recent Results (from the past 240 hour(s))  Culture, blood (x 2)     Status: None (Preliminary result)   Collection Time: 09/14/17 12:30 AM  Result Value Ref Range Status   Specimen Description BLOOD RIGHT ARM  Final   Special Requests   Final    BOTTLES DRAWN AEROBIC AND ANAEROBIC Blood Culture results may not be optimal due to an excessive volume of blood received in culture bottles   Culture   Final    NO GROWTH 1 DAY Performed at [facility] [address], [place],  [address]    Report Status PENDING  Incomplete  Culture, blood (x 2)     Status: None (Preliminary result)   Collection Time: 09/14/17  1:14 AM  Result Value Ref Range  Status   Specimen Description BLOOD LEFT HAND  Final   Special Requests   Final    IN PEDIATRIC BOTTLE Blood Culture results may not be optimal due to an excessive volume of blood received in culture bottles   Culture   Final    NO GROWTH 1 DAY Performed at [facility], [place] [address], [place], [place] [address]    Report Status PENDING  Incomplete  Aerobic Culture (superficial specimen)     Status: None (Preliminary result)   Collection Time: 09/15/17 10:18 AM  Result Value Ref Range Status   Specimen Description WOUND HAND RIGHT  Final   Special Requests NONE  Final   Gram Stain   Final    FEW WBC PRESENT, PREDOMINANTLY PMN MODERATE GRAM POSITIVE COCCI MODERATE GRAM NEGATIVE RODS MODERATE GRAM POSITIVE RODS Performed at [facility], [place] [address] [place], [place] [address]    Culture PENDING  Incomplete   Report Status PENDING  Incomplete         Radiology Studies: No results found.      Scheduled Meds: . aspirin EC  81 mg Oral Daily  . FLUoxetine  50 mg Oral QHS  . hydroxyurea  500 mg Oral Q breakfast  . loratadine  10 mg Oral Daily  . multivitamin  1 tablet Oral Daily  .  saccharomyces boulardii  250 mg Oral BID  . terazosin  2 mg Oral QHS   Continuous Infusions: . ampicillin-sulbactam (UNASYN) IV Stopped (09/16/17 0531)  . sodium chloride       LOS: 2 days    Time spent: 35 mins    Irine Seal, MD [facility] Pager [phone]  If 7PM-7AM, please contact night-coverage [email or web address] Password [email or web address] 09/16/2017, 10:19 AM

## 2017-09-16 NOTE — Evaluation (Signed)
Occupational Therapy Evaluation Patient Details Name: Nicholas [name] MRN: [id number] DOB: 22-Jun-1927 Today's Date: 09/16/2017    History of Present Illness Pt is an 84 y/o male admitted secondary to increased redness and swelling from a dog bite in R hand. Suspected cellulitis. Per note, possible OR on 4/5 for I and D of  R hand. PMH includes HTN and CKD.    Clinical Impression   Pt reports he was very independent with ADL PTA and was the primary caregiver for his wife. Pt currently supervision-min guard assist for ADL and functional mobility. Pt concerned he wont be able to take care of himself due to diminished use of RUE. Pt planning to d/c home with intermittent supervision from family. Recommending HHOT for follow up to maximize independence and safety with ADL and functional mobility upon return home. Pt would benefit from continued skilled OT to address established goals.    Follow Up Recommendations  Home health OT;Supervision - Intermittent    Equipment Recommendations  None recommended by OT    Recommendations for Other Services       Precautions / Restrictions Precautions Precautions: None Restrictions Weight Bearing Restrictions: No      Mobility Bed Mobility               General bed mobility comments: Pt OOB in chair upon arrival  Transfers Overall transfer level: Needs assistance Equipment used: None Transfers: Sit to/from Stand Sit to Stand: Min guard         General transfer comment: Close guard for safety, no physical assist required    Balance Overall balance assessment: Mild deficits observed, not formally tested                            ADL either performed or assessed with clinical judgement   ADL Overall ADL's : Needs assistance/impaired Eating/Feeding: Set up;Sitting   Grooming: Set up;Sitting   Upper Body Bathing: Set up;Supervision/ safety;Sitting   Lower Body Bathing: Min guard;Sit to/from stand   Upper Body  Dressing : Set up;Supervision/safety;Sitting   Lower Body Dressing: Min guard;Sit to/from stand   Toilet Transfer: Min guard;Ambulation           Functional mobility during ADLs: Min guard General ADL Comments: Educated on need to wrap R wrist with plastic prior to showering to prevent moisture on area. Educated on digit ROM and elevation RUE throughout day for edema management.     Vision         Perception     Praxis      Pertinent Vitals/Pain Pain Assessment: No/denies pain     Hand Dominance Right   Extremity/Trunk Assessment Upper Extremity Assessment Upper Extremity Assessment: RUE deficits/detail RUE Deficits / Details: Bandage around hand and wrist area. Pt with full AROM at digits, elbow, and shoulder.   Lower Extremity Assessment Lower Extremity Assessment: Defer to PT evaluation   Cervical / Trunk Assessment Cervical / Trunk Assessment: Kyphotic   Communication Communication Communication: No difficulties   Cognition Arousal/Alertness: Awake/alert Behavior During Therapy: WFL for tasks assessed/performed Overall Cognitive Status: Within Functional Limits for tasks assessed                                 General Comments: Pt repeating himself throughout session; "I used to be able to get around great, but not since Ive been here"   General  Comments  Pt's family present during session.     Exercises     Shoulder Instructions      Home Living Family/patient expects to be discharged to:: Private residence Living Arrangements: Spouse/significant other Available Help at Discharge: Family;Available PRN/intermittently Type of Home: House Home Access: Stairs to enter [employer] of Steps: 4 Entrance Stairs-Rails: Left Home Layout: One level     Bathroom Shower/Tub: Teacher, early years/pre: Standard     Home Equipment: Shower seat;Grab bars - tub/shower          Prior Functioning/Environment Level of  Independence: Independent        Comments: Pt is the primary caregiver for his wife; occasionally has to help her physically. Family trying to arrange [facility] services for pts wife        OT Problem List: Decreased strength;Decreased activity tolerance;Impaired balance (sitting and/or standing);Decreased cognition;Decreased knowledge of use of DME or AE;Impaired UE functional use      OT Treatment/Interventions: Self-care/ADL training;Therapeutic exercise;Energy conservation;DME and/or AE instruction;Therapeutic activities;Patient/family education;Balance training    OT Goals(Current goals can be found in the care plan section) Acute Rehab OT Goals Patient Stated Goal: to get stronger  OT Goal Formulation: With patient Time For Goal Achievement: 09/30/17 Potential to Achieve Goals: Good ADL Goals Pt Will Perform Tub/Shower Transfer: Tub transfer;with supervision;ambulating;shower seat;grab bars Additional ADL Goal #1: Pt will gather ADL items and perform ADL with mod I. Additional ADL Goal #2: Pt will independnetly verbally recall 3 edema management strategies for RUE.  OT Frequency: Min 2X/week   Barriers to D/C: Decreased caregiver support          Co-evaluation              AM-PAC PT "6 Clicks" Daily Activity     Outcome Measure Help from another person eating meals?: None Help from another person taking care of personal grooming?: A Little Help from another person toileting, which includes using toliet, bedpan, or urinal?: A Little Help from another person bathing (including washing, rinsing, drying)?: A Little Help from another person to put on and taking off regular upper body clothing?: None Help from another person to put on and taking off regular lower body clothing?: A Little 6 Click Score: 20   End of Session    Activity Tolerance: Patient tolerated treatment well Patient left: in chair;with call bell/phone within reach;with family/visitor present  OT Visit  Diagnosis: Unsteadiness on feet (R26.81)                Time: 1314-1330 OT Time Calculation (min): 16 min Charges:  OT General Charges $OT Visit: 1 Visit OT Evaluation $OT Eval Low Complexity: 1 Low G-Codes:     Esti Demello A. Ulice Brilliant, M.S., OTR/L Pager: [place] 09/16/2017, 1:50 PM

## 2017-09-16 NOTE — Consult Note (Signed)
  Patient and I discussed all issues.  I have discussed these issues with his daughter yesterday and today.  His wound is examined.  He still has some thick purulent material emanating from the wound.  Despite irrigation and debridement on 2 occasions he still has reaccumulation of infection.  His hand looks markedly better but he is forearm has some erythema still tracking.  I have discussed with the family these issues.  We performed a dressing change at bedside.  Given these findings I would recommend a formal irrigation and debridement in the operative theater today.  I discussed with he and his family the risk and benefit profile.  I have discussed with patient and his family that unfortunately the infection usually dictates what we do.  Although we want the infection to be gone and eradicated swiftly sometimes they are very difficult to heal.  We need to maintain diligence and perform serial irrigation and debridements to try and get him back to a state of affairs that is nonproblematic .   Patient and his family understand this.  I will have him n.p.o. after 8 AM.  He can drink liquids till 8 AM and I will plan for surgery around 4 to 4:30 PM today.  I would expect we need to maintain his inpatient status.  I reviewed his labs and findings.  I discussed with him all issues.  We are planning surgery for your upper extremity. The risk and benefits of surgery to include risk of bleeding, infection, anesthesia,  damage to normal structures and failure of the surgery to accomplish its intended goals of relieving symptoms and restoring function have been discussed in detail. With this in mind we plan to proceed. I have specifically discussed with the patient the pre-and postoperative regime and the dos and don'ts and risk and benefits in great detail. Risk and benefits of surgery also include risk of dystrophy(CRPS), chronic nerve pain, failure of the healing process to go onto completion and other  inherent risks of surgery The relavent the pathophysiology of the disease/injury process, as well as the alternatives for treatment and postoperative course of action has been discussed in great detail with the patient who desires to proceed.  We will do everything in our power to help you (the patient) restore function to the upper extremity. It is a pleasure to see this patient today.   Drae Mitzel MD

## 2017-09-16 NOTE — Anesthesia Preprocedure Evaluation (Addendum)
Anesthesia Evaluation  Patient identified by MRN, date of birth, ID band Patient awake    Reviewed: Allergy & Precautions, H&P , NPO status , Patient's Chart, lab work & pertinent test results, reviewed documented beta blocker date and time   Airway Mallampati: II  TM Distance: >3 FB Neck ROM: full    Dental  (+) Partial Upper, Missing, Poor Dentition   Pulmonary neg pulmonary ROS, former smoker,    Pulmonary exam normal breath sounds clear to auscultation       Cardiovascular hypertension,  Rhythm:regular Rate:Normal     Neuro/Psych PSYCHIATRIC DISORDERS Depression negative neurological ROS     GI/Hepatic negative GI ROS, Neg liver ROS,   Endo/Other  negative endocrine ROS  Renal/GU Renal InsufficiencyRenal disease     Musculoskeletal negative musculoskeletal ROS (+)   Abdominal   Peds  Hematology  (+) Blood dyscrasia (Thrombocytosis), anemia ,   Anesthesia Other Findings Day of surgery medications reviewed with the patient.  Reproductive/Obstetrics                            Anesthesia Physical Anesthesia Plan  ASA: II  Anesthesia Plan: General   Post-op Pain Management:    Induction: Intravenous  PONV Risk Score and Plan: 2 and Dexamethasone and Ondansetron  Airway Management Planned: LMA  Additional Equipment:   Intra-op Plan:   Post-operative Plan:   Informed Consent: I have reviewed the patients History and Physical, chart, labs and discussed the procedure including the risks, benefits and alternatives for the proposed anesthesia with the patient or authorized representative who has indicated his/her understanding and acceptance.   Dental Advisory Given  Plan Discussed with: CRNA and Surgeon  Anesthesia Plan Comments: ( )       Anesthesia Quick Evaluation

## 2017-09-16 NOTE — Anesthesia Procedure Notes (Signed)
Procedure Name: LMA Insertion Date/Time: 09/16/2017 5:04 PM Performed by: Renato Shin, CRNA Pre-anesthesia Checklist: Patient identified, Emergency Drugs available, Suction available and Patient being monitored Patient Re-evaluated:Patient Re-evaluated prior to induction Oxygen Delivery Method: Circle system utilized Preoxygenation: Pre-oxygenation with 100% oxygen Induction Type: IV induction LMA: LMA inserted LMA Size: 5.0 Number of attempts: 1 Placement Confirmation: positive ETCO2,  CO2 detector and breath sounds checked- equal and bilateral Tube secured with: Tape Dental Injury: Teeth and Oropharynx as per pre-operative assessment

## 2017-09-16 NOTE — Evaluation (Signed)
Physical Therapy Evaluation Patient Details Name: Nicholas [name] MRN: [id number] DOB: 01-09-1928 Today's Date: 09/16/2017   History of Present Illness  Pt is an 84 y/o male admitted secondary to increased redness and swelling from a dog bite in R hand. Suspected cellulitis. Per note, possible OR on 4/5 for I and D of  R hand. PMH includes HTN and CKD.   Clinical Impression  Pt admitted secondary to problem above with deficits below. Pt tolerated gait well, however, reports feeling weaker than normal. Required use of IV pole and min guard A for mobility this session. Discussed DME recommendations with pt and role of PT. Reports wife has dementia, however, other family members can check on pt throughout the day. Will continue to follow acutely to maximize functional mobility independence and safety.     Follow Up Recommendations Home health PT;Supervision for mobility/OOB    Equipment Recommendations  3in1 (PT);Cane    Recommendations for Other Services OT consult     Precautions / Restrictions Precautions Precautions: None Restrictions Weight Bearing Restrictions: No      Mobility  Bed Mobility               General bed mobility comments: In chair upon entry.   Transfers Overall transfer level: Needs assistance Equipment used: None Transfers: Sit to/from Stand Sit to Stand: Min assist         General transfer comment: Min A for steadying assist. Increased time required.   Ambulation/Gait Ambulation/Gait assistance: Min guard Ambulation Distance (Feet): 200 Feet Assistive device: (IV pole ) Gait Pattern/deviations: Step-through pattern;Decreased stride length;Trunk flexed Gait velocity: Decreased  Gait velocity interpretation: Below normal speed for age/gender General Gait Details: Slow, slightly unsteady gait, however, no overt LOB noted. Used IV pole for steadying. Educated about use of cane at home to increase stability.   Stairs             Wheelchair Mobility    Modified Rankin (Stroke Patients Only)       Balance Overall balance assessment: Needs assistance Sitting-balance support: No upper extremity supported;Feet supported Sitting balance-Leahy Scale: Good     Standing balance support: During functional activity;Single extremity supported;No upper extremity supported Standing balance-Leahy Scale: Fair Standing balance comment: Able to maintain static standing without UE support.                              Pertinent Vitals/Pain Pain Assessment: No/denies pain    Home Living Family/patient expects to be discharged to:: Private residence Living Arrangements: Spouse/significant other Available Help at Discharge: Family;Available PRN/intermittently(wife has dementia ) Type of Home: House Home Access: Stairs to enter Entrance Stairs-Rails: Left Entrance Stairs-Number of Steps: 4 Home Layout: One level Home Equipment: None      Prior Function Level of Independence: Independent               Hand Dominance   Dominant Hand: Right    Extremity/Trunk Assessment   Upper Extremity Assessment Upper Extremity Assessment: RUE deficits/detail RUE Deficits / Details: R hand bandaged.     Lower Extremity Assessment Lower Extremity Assessment: Generalized weakness    Cervical / Trunk Assessment Cervical / Trunk Assessment: Kyphotic  Communication   Communication: No difficulties  Cognition Arousal/Alertness: Awake/alert Behavior During Therapy: WFL for tasks assessed/performed Overall Cognitive Status: Within Functional Limits for tasks assessed  General Comments General comments (skin integrity, edema, etc.): Pt's family present during session.     Exercises     Assessment/Plan    PT Assessment Patient needs continued PT services  PT Problem List Decreased strength;Decreased balance;Decreased mobility;Decreased knowledge  of use of DME;Pain       PT Treatment Interventions DME instruction;Gait training;Stair training;Functional mobility training;Therapeutic activities;Therapeutic exercise;Balance training;Neuromuscular re-education;Patient/family education    PT Goals (Current goals can be found in the Care Plan section)  Acute Rehab PT Goals Patient Stated Goal: to get stronger  PT Goal Formulation: With patient Time For Goal Achievement: 09/30/17 Potential to Achieve Goals: Good    Frequency Min 3X/week   Barriers to discharge        Co-evaluation               AM-PAC PT "6 Clicks" Daily Activity  Outcome Measure Difficulty turning over in bed (including adjusting bedclothes, sheets and blankets)?: A Little Difficulty moving from lying on back to sitting on the side of the bed? : A Little Difficulty sitting down on and standing up from a chair with arms (e.g., wheelchair, bedside commode, etc,.)?: Unable Help needed moving to and from a bed to chair (including a wheelchair)?: A Little Help needed walking in hospital room?: A Little Help needed climbing 3-5 steps with a railing? : A Little 6 Click Score: 16    End of Session Equipment Utilized During Treatment: Gait belt Activity Tolerance: Patient tolerated treatment well Patient left: in chair;with call bell/phone within reach;with family/visitor present Nurse Communication: Mobility status PT Visit Diagnosis: Unsteadiness on feet (R26.81);Muscle weakness (generalized) (M62.81)    Time: 1155-2080 PT Time Calculation (min) (ACUTE ONLY): 21 min   Charges:   PT Evaluation $PT Eval Low Complexity: 1 Low     PT G Codes:        Leighton Ruff, PT, DPT  Acute Rehabilitation Services  Pager: [phone]   Rudean Hitt 09/16/2017, 12:24 PM

## 2017-09-16 NOTE — Progress Notes (Addendum)
Received patient from PACU with compression wrap and carter arm foam at right hand, able to move fingers at right hand and full of sensation,  AOx3, VS stable, c/o rt hand pain at 8/10, administered PRN pain medication Percocet per order, family members at bedside.  Will monitor.

## 2017-09-16 NOTE — Progress Notes (Signed)
Patient transferred to OR at this time.

## 2017-09-16 NOTE — Consult Note (Signed)
            [facility] CM Primary Care Navigator  09/16/2017  ADIR SCHICKER 1927-07-25 [id number]   Went to see patient in the room to identify possible discharge needs but RN reports that patientis still in the OR for irrigation and debridement of right wrist at this time.   Will attempt to see patient at another time when available in the room.    Addendum (09/19/17):  Went back to see patient at the bedside to identify possible discharge needsbuthe was already discharged. Patient was discharged home today with home health services.  Per chart review, patient was seen for sepsis secondary to right hand cellulitis after a dog bite (irrigation and debridement).  Patient has discharge instruction to follow-up withorthopedic surgeon in 2 days   For additional questions please contact:  Edwena Felty A. Mashell Sieben, BSN, RN-BC [facility] PRIMARY CARE Navigator Cell: [phone]

## 2017-09-16 NOTE — Progress Notes (Signed)
Pharmacy Antibiotic Note  Nicholas [name] is a 84 y.o. male admitted on 09/13/2017 with wound infection.  Pharmacy has been consulted for unasyn dosing. Patient was bit by family dog.  S/p 2 bedside I&Ds, going for I&D in OR with ortho today per notes.  Plan: Unasyn 1.5g IV q6 hours Monitor clinical progression, c/s F/u transition to oral antibiotics, LOT  Height: 5\' 8"  (172.7 cm) Weight: 171 lb 15.3 oz (78 kg) IBW/kg (Calculated) : 68.4  Temp (24hrs), Avg:98.6 F (37 C), Min:98.3 F (36.8 C), Max:99.1 F (37.3 C)  Recent Labs  Lab 09/13/17 2149 09/13/17 2229 09/14/17 0117 09/14/17 0413 09/15/17 0631 09/16/17 0627  WBC 19.6*  --   --  15.8* 12.3* 11.2*  CREATININE 1.51*  --   --  1.43* 1.35* 1.40*  LATICACIDVEN  --  1.79 1.1  --   --   --     Estimated Creatinine Clearance: 36 mL/min (A) (by C-G formula based on SCr of 1.4 mg/dL (H)).    Allergies  Allergen Reactions  . Tape Other (See Comments)    The patient's SKIN IS VERY THIN AND TEARS AND BRUISES VERY EASILY!!  . Erythromycin Other (See Comments)    Gums turned red and bled profusely    Unasyn 4/2>>  4/3: BC x 2: NGTD   Thank you for allowing pharmacy to be a part of this patient's care.  Kaylor Simenson D. Laci Frenkel, PharmD, BCPS Clinical Pharmacist Clinical Phone for 09/16/2017 until 3:30pm: [phone] If after 3:30pm, please call main pharmacy at [phone] 09/16/2017 12:44 PM

## 2017-09-16 NOTE — Transfer of Care (Signed)
Immediate Anesthesia Transfer of Care Note  Patient: Nicholas [name]  Procedure(s) Performed: IRRIGATION AND DEBRIDEMENT EXTREMITY (Right Wrist)  Patient Location: PACU  Anesthesia Type:General  Level of Consciousness: awake, sedated and patient cooperative  Airway & Oxygen Therapy: Patient Spontanous Breathing and Patient connected to nasal cannula oxygen  Post-op Assessment: Report given to RN and Post -op Vital signs reviewed and stable  Post vital signs: Reviewed and stable  Last Vitals:  Vitals Value Taken Time  BP 135/66 09/16/2017  6:20 PM  Temp    Pulse 75 09/16/2017  6:21 PM  Resp 14 09/16/2017  6:21 PM  SpO2 100 % 09/16/2017  6:21 PM  Vitals shown include unvalidated device data.  Last Pain:  Vitals:   09/16/17 0443  TempSrc: Oral  PainSc:          Complications: No apparent anesthesia complications

## 2017-09-16 NOTE — Op Note (Signed)
See full dictation [id number]  Status post irrigation debridement and radical tenosynovectomy of an infectious first dorsal compartment including the extensor tendons.  I would recommend continued Unasyn.  Will await cultures.  I will plan for a dressing change Sunday.  Hopefully the patient will respond nicely.  I went through all issues with his family.  Darris Carachure MD

## 2017-09-17 DIAGNOSIS — L03113 Cellulitis of right upper limb: Secondary | ICD-10-CM

## 2017-09-17 LAB — CBC WITH DIFFERENTIAL/PLATELET
BLASTS: 0 %
Band Neutrophils: 0 %
Basophils Absolute: 0 10*3/uL (ref 0.0–0.1)
Basophils Relative: 0 %
Eosinophils Absolute: 0 10*3/uL (ref 0.0–0.7)
Eosinophils Relative: 0 %
HEMATOCRIT: 25.6 % — AB (ref 39.0–52.0)
HEMOGLOBIN: 8.5 g/dL — AB (ref 13.0–17.0)
LYMPHS ABS: 2.4 10*3/uL (ref 0.7–4.0)
Lymphocytes Relative: 13 %
MCH: 36.2 pg — ABNORMAL HIGH (ref 26.0–34.0)
MCHC: 33.2 g/dL (ref 30.0–36.0)
MCV: 108.9 fL — AB (ref 78.0–100.0)
MONOS PCT: 14 %
Metamyelocytes Relative: 0 %
Monocytes Absolute: 2.6 10*3/uL — ABNORMAL HIGH (ref 0.1–1.0)
Myelocytes: 0 %
Neutro Abs: 13.4 10*3/uL — ABNORMAL HIGH (ref 1.7–7.7)
Neutrophils Relative %: 73 %
PROMYELOCYTES RELATIVE: 0 %
Platelets: 246 10*3/uL (ref 150–400)
RBC: 2.35 MIL/uL — AB (ref 4.22–5.81)
RDW: 22.7 % — AB (ref 11.5–15.5)
WBC: 18.4 10*3/uL — AB (ref 4.0–10.5)
nRBC: 0 /100 WBC

## 2017-09-17 LAB — BASIC METABOLIC PANEL
ANION GAP: 10 (ref 5–15)
BUN: 23 mg/dL — ABNORMAL HIGH (ref 6–20)
CHLORIDE: 104 mmol/L (ref 101–111)
CO2: 21 mmol/L — AB (ref 22–32)
Calcium: 8.1 mg/dL — ABNORMAL LOW (ref 8.9–10.3)
Creatinine, Ser: 1.46 mg/dL — ABNORMAL HIGH (ref 0.61–1.24)
GFR calc Af Amer: 48 mL/min — ABNORMAL LOW (ref >60)
GFR calc non Af Amer: 41 mL/min — ABNORMAL LOW (ref >60)
GLUCOSE: 124 mg/dL — AB (ref 65–99)
POTASSIUM: 4.3 mmol/L (ref 3.5–5.1)
Sodium: 135 mmol/L (ref 135–145)

## 2017-09-17 NOTE — Progress Notes (Signed)
Occupational Therapy Treatment Patient Details Name: Nicholas [name] MRN: [id number] DOB: Feb 12, 1928 Today's Date: 09/17/2017    History of present illness Pt is an 84 y.o. male admitted secondary to increased redness and swelling from a dog bite in R hand. Suspected cellulitis. Per note, possible OR on 4/5 for I and D of  R hand. PMH includes HTN and CKD. Pt now s/p I&D of Rt UE.   OT comments  Pt unsteady on feet. Education provided in session to pt and his daughter. Feel pt will continue to benefit from acute OT to increase independence prior to d/c.   Follow Up Recommendations  Home health OT;Supervision/Assistance - 24 hour    Equipment Recommendations  None recommended by OT    Recommendations for Other Services      Precautions / Restrictions Precautions Precautions: Fall Restrictions Weight Bearing Restrictions: No       Mobility Bed Mobility               General bed mobility comments: not assessed  Transfers Overall transfer level: Needs assistance   Transfers: Sit to/from Stand Sit to Stand: Min assist         General transfer comment: assist to boost to stand and to stabilize.    Balance    Min guard for ambulation.                                       ADL either performed or assessed with clinical judgement   ADL Overall ADL's : Needs assistance/impaired                     Lower Body Dressing: Moderate assistance;Sit to/from stand Lower Body Dressing Details (indicate cue type and reason): donning underwear Toilet Transfer: Minimal assistance;Ambulation(sit to stand from chair.)           Functional mobility during ADLs: Minimal assistance(Min guard-ambulation; Min A-sit to stand) General ADL Comments: Educated on edema management techniques for RUE (ice, elevation, moving digits, and retrograde massage if digits become swollen). Educated on tub transfer technique-pt has tub bench and also went over holding to  shower wall if he steps over. Educated on safety such as having someone with him for shower transfer and bathing and sitting for LB ADLs. pt able to tell OT later in session an edema management technique.     Vision       Perception     Praxis      Cognition Arousal/Alertness: Awake/alert Behavior During Therapy: WFL for tasks assessed/performed Overall Cognitive Status: Within Functional Limits for tasks assessed                                          Exercises Exercises: Other exercises Other Exercises Other Exercises: moving right digits   Shoulder Instructions       General Comments      Pertinent Vitals/ Pain       Pain Assessment: 0-10 Pain Score: 1  Pain Location: Rt UE Pain Descriptors / Indicators: Numbness Pain Intervention(s): Monitored during session;Repositioned  Home Living  Prior Functioning/Environment              Frequency  Min 2X/week        Progress Toward Goals  OT Goals(current goals can now be found in the care plan section)  Progress towards OT goals: Progressing toward goals  Acute Rehab OT Goals Patient Stated Goal: not stated OT Goal Formulation: With patient Time For Goal Achievement: 09/30/17 Potential to Achieve Goals: Good ADL Goals Pt Will Perform Tub/Shower Transfer: Tub transfer;with supervision;ambulating;shower seat;grab bars Additional ADL Goal #1: Pt will gather ADL items and perform ADL with mod I. Additional ADL Goal #2: Pt will independnetly verbally recall 3 edema management strategies for RUE.  Plan Discharge plan needs to be updated    Co-evaluation                 AM-PAC PT "6 Clicks" Daily Activity     Outcome Measure   Help from another person eating meals?: A Little Help from another person taking care of personal grooming?: A Little Help from another person toileting, which includes using toliet, bedpan, or  urinal?: A Little Help from another person bathing (including washing, rinsing, drying)?: A Little Help from another person to put on and taking off regular upper body clothing?: A Little Help from another person to put on and taking off regular lower body clothing?: A Lot 6 Click Score: 17    End of Session Equipment Utilized During Treatment: Gait belt  OT Visit Diagnosis: Unsteadiness on feet (R26.81)   Activity Tolerance Patient tolerated treatment well   Patient Left in chair;with call bell/phone within reach   Nurse Communication          Time: 1150-1206 OT Time Calculation (min): 16 min  Charges: OT General Charges $OT Visit: 1 Visit OT Treatments $Self Care/Home Management : 8-22 mins   Araseli Sherry L Buford Gayler OTR/L 09/17/2017, 12:13 PM

## 2017-09-17 NOTE — Plan of Care (Signed)
  Problem: Education: Goal: Knowledge of General Education information will improve Outcome: Progressing   Problem: Pain Managment: Goal: General experience of comfort will improve Outcome: Progressing   

## 2017-09-17 NOTE — Anesthesia Postprocedure Evaluation (Signed)
Anesthesia Post Note  Patient: Nicholas [name]  Procedure(s) Performed: IRRIGATION AND DEBRIDEMENT EXTREMITY (Right Wrist)     Patient location during evaluation: PACU Anesthesia Type: General Level of consciousness: awake and alert Pain management: pain level controlled Vital Signs Assessment: post-procedure vital signs reviewed and stable Respiratory status: spontaneous breathing, nonlabored ventilation and respiratory function stable Cardiovascular status: blood pressure returned to baseline and stable Postop Assessment: no apparent nausea or vomiting Anesthetic complications: no    Last Vitals:  Vitals:   09/16/17 1953 09/17/17 0412  BP: (!) 151/66 130/69  Pulse: 76 85  Resp: 16 18  Temp: 36.9 C 36.8 C  SpO2: 95% 98%    Last Pain:  Vitals:   09/17/17 0942  TempSrc:   PainSc: 5                  Catalina Gravel

## 2017-09-17 NOTE — Op Note (Signed)
NAME:  Nicholas [name], Nicholas [name]             ACCOUNT NO.:  [id number]  MEDICAL RECORD NO.:  [id number]  LOCATION:  [id number]                        FACILITY:  [facility]  PHYSICIAN:  Satira Anis. Beckhem Isadore, M.D.DATE OF BIRTH:  Oct 16, 1927  DATE OF PROCEDURE: DATE OF DISCHARGE:                              OPERATIVE REPORT   PREOPERATIVE DIAGNOSIS:  Right hand dog bite with infectious sequela and extensor tenosynovitis, infectious in nature.  POSTOPERATIVE DIAGNOSIS:  Right hand dog bite with infectious sequela and extensor tenosynovitis, infectious in nature.  POSTOPERATIVE DIAGNOSIS:  Right hand dog bite with infectious sequela and extensor tenosynovitis, infectious in nature.  PROCEDURES: 1. Irrigation and debridement of skin, subcutaneous tissue, and muscle     as well as tendon tissue.  This was an excisional debridement with     curette, knife, blade, and scissor. 2. Radical tenosynovectomy, APL and EPB tendon, secondary to     infectious tenosynovitis.  This was a radical tenolysis and     tenosynovectomy of the extensor tendon apparatus with first dorsal     compartment decompression.  SURGEON:  Satira Anis. Amedeo Plenty, M.D.  ASSISTANT:  None.  COMPLICATIONS:  None.  FINDINGS:  The patient had thick and purulent entity about the tendon sheath of the extensor apparatus.  He underwent I and D and decompression of the tendon sheath.  The patient tolerated this well.  OPERATIVE PROCEDURE IN DETAIL:  The patient was seen by myself and Anesthesia, taken to the operative theater and underwent smooth induction of general anesthetic.  He was prepped with a Betadine scrub and paint.  Following this, outline marks were made visually.  Incision was made over the dog bite about the dorsal radial aspect of his thumb CMC base region.  Dissection was carefully carried down and a large amount of devitalized tissue was encountered and removed.  He had previously had cultures, which were growing Gram-positive  cocci and Gram- negative rods.  The patient had devitalized tissue about the muscle. Tendon, subcu, and skin removed sharply with knife blade under 4.5 loupe magnification.  This was irrigated.  Foreign body and necrotic tissue were excised with curette, knife, blade, and scissor.  Following this, I entered the first dorsal compartment and noted extensor tenosynovitis and infectious material within the tendon sheath. I feel that the infectious tendon sheath tissue was causing the traveling infection.  I opened down the arm with incomplete recovery with simple IV antibiotics and local wound care.  Thus, at this time I performed a tendon sheath incision and debrided the tendon aggressively. This was a radical tenolysis and tenosynovectomy of the EPB and APL tendons.  First dorsal compartment was released.  I then obtained hemostasis with the tourniquet deflated and washed him with 6 L of saline.  I then placed a drain in the form of a blue vessel loop drain in the wound and closed the patient with 5-0 Prolene far-near- near-far sutures.  The skin was very friable.  We made sure hemostasis was stable and following this, placed him in a dressing of Mepitel, Xeroform, Neosporin throughout the skin as he has fragile skin and a standard dressing with thumb spica splint.  He tolerated this well.  Plan to remove the bandage on Sunday and perform evaluation.  Should any problems arise, he will notify me of course.  We will be immediately available.  I discussed all issues with his family.  This was a very extensive I and D with radical tenosynovectomy.  Pleasure see him today and participate in his care.     Satira Anis. Amedeo Plenty, M.D.     [facility]  D:  09/16/2017  T:  09/17/2017  Job:  [id number]

## 2017-09-17 NOTE — Progress Notes (Signed)
PROGRESS NOTE    Nicholas [name]  IOX:[id number] DOB: May 15, 1928 DOA: 09/13/2017 PCP: Denita Lung, MD   Brief Narrative:  84 year old male with history of hypertension, depression, essential thrombocytosis, anemia, chronic kidney disease stage III and BPH presented on 09/13/2017 after suffering from a dog bite resulting in fever, chills and increased pain/swelling of his right hand.  He was started on Unasyn.  Hand surgery was consulted.  Patient underwent bedside irrigation and debridement x2 and culture sent off per hand surgeon, Dr. Amedeo Plenty.  Patient to OR for formal irrigation and debridement today 09/16/2017.     Assessment & Plan:   Principal Problem:   Cellulitis of right hand Active Problems:   Depression   Anemia in chronic kidney disease   Essential hypertension   Essential thrombocytosis (HCC)   BPH (benign prostatic hyperplasia)   CKD (chronic kidney disease) stage 3, GFR 30-59 ml/min (HCC)   Dog bite of right hand   Malnutrition of moderate degree  #1 sepsis secondary to right hand cellulitis after dog bite Slowly clinically improving.  Patient afebrile.  WBC fluctuating and trending back up.  Patient status post bedside irrigation and debridement x2 by hand surgeon, Dr. Amedeo Plenty.  Patient seen by Dr. Amedeo Plenty yesterday morning for dressing change wound was examined and thick purulent material emanating from wound was noted despite irrigation and debridement x2 is felt patient has had a reaccumulation of infection.  Hand improving with some erythema in the forearm.  Dressing change per hand surgeon was done the morning of 09/16/2017.  Patient was taken to the operating room and underwent formal irrigation and debridement per hand surgeon 07/21/8339 with no complications noted.  Cultures still pending.  Continue empiric IV Unasyn. Per Hand surgery.  2.  Leukocytosis Secondary to problem #1.  WBC fluctuating.  WBC currently at 18.4 from 11.2 from 15.8 from 19.6.  Wound cultures  pending.  Likely increase in leukocytosis today may be reactive or secondary to I and D which was done in the OR yesterday 09/16/2017.  Continue empiric IV Unasyn for now.  Follow.    3.  Hypertension Stable.  Continue terazosin.  4.  Essential thrombocytosis Platelet count improved and currently at 246.  Continue hydroxyurea.  5.  Chronic kidney disease stage III Baseline creatinine 1.5-1.6.  Creatinine stable at 1.46.  Follow.    6.  BPH Continue terazosin.  7.  Anemia of chronic kidney disease H&H stable.  8.  Depression Stable.  Continue Prozac.     DVT prophylaxis: SCDs Code Status: Full Family Communication: Updated patient.  No family at bedside.  Disposition Plan: Home once medically stable, clinically improved, cultures have resulted and per hand surgeon.   Consultants:   Hand surgery: Dr. Amedeo Plenty 09/14/2017  Procedures:   Bedside irrigation and debridement x2 per Dr. Amedeo Plenty  Plain films of the right hand 09/13/2017   Irrigation and debridement of skin, subcutaneous tissue and muscle as well as tendon tissue/radical tenosynovectomy, APL and he EB tendon, secondary to infectious tenosynovitis.  This was a radical tenolysis and tenosynovectomy of the extensor tendon apparatus with first dorsal compartment decompression.----Dr. Amedeo Plenty 09/16/2017  Antimicrobials:   IV Unasyn 09/13/2017   Subjective: Patient sitting up in the chair.  Denies any chest pain.  No shortness of breath.  No nausea.  No vomiting.    Objective: Vitals:   09/16/17 1933 09/16/17 1953 09/17/17 0412 09/17/17 1326  BP: 132/69 (!) 151/66 130/69 (!) 117/55  Pulse: 77 76 85 77  Resp: 15 16 18    Temp: (!) 97.4 F (36.3 C) 98.4 F (36.9 C) 98.3 F (36.8 C) 98.3 F (36.8 C)  TempSrc:  Oral Oral Oral  SpO2: 96% 95% 98% 98%  Weight:      Height:        Intake/Output Summary (Last 24 hours) at 09/17/2017 1404 Last data filed at 09/17/2017 0917 Gross per 24 hour  Intake 1457 ml  Output 1220 ml    Net 237 ml   Filed Weights   09/14/17 0223  Weight: 78 kg (171 lb 15.3 oz)    Examination:  General exam: Appears calm and comfortable  Respiratory system: Lungs are clear to auscultation bilaterally.  No wheezes, no crackles, no rhonchi.  Respiratory effort normal. Cardiovascular system: Regular rate rhythm no murmurs rubs or gallops.  No JVD.  No lower extremity edema.  Gastrointestinal system: Abdomen is soft, nontender, nondistended, positive bowel sounds.  No rebound.  No guarding.   Central nervous system: Alert and oriented. No focal neurological . Extremities: Right upper extremity with hand bandaged. Skin: No rashes, lesions or ulcers Psychiatry: Judgement and insight appear normal. Mood & affect appropriate.     Data Reviewed: I have personally reviewed following labs and imaging studies  CBC: Recent Labs  Lab 09/13/17 2149 09/14/17 0413 09/15/17 0631 09/16/17 0627 09/17/17 0535  WBC 19.6* 15.8* 12.3* 11.2* 18.4*  NEUTROABS 17.4*  --   --  7.5 13.4*  HGB 10.2* 9.3* 9.0* 9.4* 8.5*  HCT 31.6* 29.2* 27.8* 30.1* 25.6*  MCV 110.1* 108.6* 109.9* 107.5* 108.9*  PLT 313 261 233 228 109   Basic Metabolic Panel: Recent Labs  Lab 09/13/17 2149 09/14/17 0413 09/15/17 0631 09/16/17 0627 09/17/17 0535  NA 139 138 137 138 135  K 4.5 4.4 4.1 3.8 4.3  CL 107 106 108 108 104  CO2 21* 18* 20* 20* 21*  GLUCOSE 118* 114* 84 88 124*  BUN 28* 28* 23* 24* 23*  CREATININE 1.51* 1.43* 1.35* 1.40* 1.46*  CALCIUM 8.7* 8.4* 8.0* 8.3* 8.1*  MG  --   --   --  2.1  --    GFR: Estimated Creatinine Clearance: 34.5 mL/min (A) (by C-G formula based on SCr of 1.46 mg/dL (H)). Liver Function Tests: Recent Labs  Lab 09/13/17 2149  AST 20  ALT 14*  ALKPHOS 71  BILITOT 1.0  PROT 7.1  ALBUMIN 3.6   No results for input(s): LIPASE, AMYLASE in the last 168 hours. No results for input(s): AMMONIA in the last 168 hours. Coagulation Profile: Recent Labs  Lab 09/14/17 0116  INR  1.25   Cardiac Enzymes: No results for input(s): CKTOTAL, CKMB, CKMBINDEX, TROPONINI in the last 168 hours. BNP (last 3 results) No results for input(s): PROBNP in the last 8760 hours. HbA1C: No results for input(s): HGBA1C in the last 72 hours. CBG: No results for input(s): GLUCAP in the last 168 hours. Lipid Profile: No results for input(s): CHOL, HDL, LDLCALC, TRIG, CHOLHDL, LDLDIRECT in the last 72 hours. Thyroid Function Tests: No results for input(s): TSH, T4TOTAL, FREET4, T3FREE, THYROIDAB in the last 72 hours. Anemia Panel: Recent Labs    09/16/17 1022  VITAMINB12 558  FOLATE 15.4  FERRITIN 289  TIBC 255  IRON 66  RETICCTPCT 2.1   Sepsis Labs: Recent Labs  Lab 09/13/17 2229 09/14/17 0116 09/14/17 0117  PROCALCITON  --  0.12  --   LATICACIDVEN 1.79  --  1.1    Recent Results (from the past  240 hour(s))  Culture, blood (x 2)     Status: None (Preliminary result)   Collection Time: 09/14/17 12:30 AM  Result Value Ref Range Status   Specimen Description BLOOD RIGHT ARM  Final   Special Requests   Final    BOTTLES DRAWN AEROBIC AND ANAEROBIC Blood Culture results may not be optimal due to an excessive volume of blood received in culture bottles   Culture   Final    NO GROWTH 3 DAYS Performed at [facility], [place] [address], [place], [place] [address]    Report Status PENDING  Incomplete  Culture, blood (x 2)     Status: None (Preliminary result)   Collection Time: 09/14/17  1:14 AM  Result Value Ref Range Status   Specimen Description BLOOD LEFT HAND  Final   Special Requests   Final    IN PEDIATRIC BOTTLE Blood Culture results may not be optimal due to an excessive volume of blood received in culture bottles   Culture   Final    NO GROWTH 3 DAYS Performed at [facility], [place] [address], [place], [place] [address]    Report Status PENDING  Incomplete  Aerobic Culture (superficial specimen)     Status: None (Preliminary result)   Collection  Time: 09/15/17 10:18 AM  Result Value Ref Range Status   Specimen Description WOUND HAND RIGHT  Final   Special Requests NONE  Final   Gram Stain   Final    FEW WBC PRESENT, PREDOMINANTLY PMN MODERATE GRAM POSITIVE COCCI MODERATE GRAM NEGATIVE RODS MODERATE GRAM POSITIVE RODS    Culture   Final    CULTURE REINCUBATED FOR BETTER GROWTH Performed at [facility], [place] [address], [place], [place] [address]    Report Status PENDING  Incomplete  MRSA PCR Screening     Status: None   Collection Time: 09/16/17 11:16 AM  Result Value Ref Range Status   MRSA by PCR NEGATIVE NEGATIVE Final    Comment:        The GeneXpert MRSA Assay (FDA approved for NASAL specimens only), is one component of a comprehensive MRSA colonization surveillance program. It is not intended to diagnose MRSA infection nor to guide or monitor treatment for MRSA infections. Performed at [facility], [place] [address], [place], [place] [address]          Radiology Studies: No results found.      Scheduled Meds: . aspirin EC  81 mg Oral Daily  . FLUoxetine  50 mg Oral QHS  . hydroxyurea  500 mg Oral Q breakfast  . loratadine  10 mg Oral Daily  . multivitamin  1 tablet Oral Daily  . saccharomyces boulardii  250 mg Oral BID  . terazosin  2 mg Oral QHS   Continuous Infusions: . ampicillin-sulbactam (UNASYN) IV 1.5 g (09/17/17 0944)  . lactated ringers Stopped (09/16/17 2215)  . sodium chloride       LOS: 3 days    Time spent: 35 mins    Irine Seal, MD [facility] Pager [phone]  If 7PM-7AM, please contact night-coverage [email or web address] Password [email or web address] 09/17/2017, 2:04 PM

## 2017-09-18 ENCOUNTER — Encounter (HOSPITAL_COMMUNITY): Payer: Self-pay | Admitting: Orthopedic Surgery

## 2017-09-18 LAB — CBC WITH DIFFERENTIAL/PLATELET
BASOS PCT: 1 %
Basophils Absolute: 0.2 10*3/uL — ABNORMAL HIGH (ref 0.0–0.1)
EOS PCT: 0 %
Eosinophils Absolute: 0 10*3/uL (ref 0.0–0.7)
HEMATOCRIT: 26.7 % — AB (ref 39.0–52.0)
HEMOGLOBIN: 8.7 g/dL — AB (ref 13.0–17.0)
LYMPHS ABS: 1.9 10*3/uL (ref 0.7–4.0)
Lymphocytes Relative: 12 %
MCH: 35.5 pg — ABNORMAL HIGH (ref 26.0–34.0)
MCHC: 32.6 g/dL (ref 30.0–36.0)
MCV: 109 fL — AB (ref 78.0–100.0)
MONOS PCT: 13 %
Monocytes Absolute: 2.1 10*3/uL — ABNORMAL HIGH (ref 0.1–1.0)
Neutro Abs: 11.7 10*3/uL — ABNORMAL HIGH (ref 1.7–7.7)
Neutrophils Relative %: 74 %
Platelets: 296 10*3/uL (ref 150–400)
RBC: 2.45 MIL/uL — AB (ref 4.22–5.81)
RDW: 22.6 % — ABNORMAL HIGH (ref 11.5–15.5)
WBC: 15.9 10*3/uL — AB (ref 4.0–10.5)

## 2017-09-18 LAB — BASIC METABOLIC PANEL
Anion gap: 10 (ref 5–15)
BUN: 24 mg/dL — ABNORMAL HIGH (ref 6–20)
CHLORIDE: 106 mmol/L (ref 101–111)
CO2: 22 mmol/L (ref 22–32)
Calcium: 8.2 mg/dL — ABNORMAL LOW (ref 8.9–10.3)
Creatinine, Ser: 1.6 mg/dL — ABNORMAL HIGH (ref 0.61–1.24)
GFR calc Af Amer: 43 mL/min — ABNORMAL LOW (ref >60)
GFR calc non Af Amer: 37 mL/min — ABNORMAL LOW (ref >60)
GLUCOSE: 89 mg/dL (ref 65–99)
Potassium: 4.2 mmol/L (ref 3.5–5.1)
Sodium: 138 mmol/L (ref 135–145)

## 2017-09-18 NOTE — Progress Notes (Signed)
PROGRESS NOTE    Nicholas [name]  RJJ:[id number] DOB: [birth year] DOA: 09/13/2017 PCP: Denita Lung, MD   Brief Narrative:  84 year old male with history of hypertension, depression, essential thrombocytosis, anemia, chronic kidney disease stage III and BPH presented on 09/13/2017 after suffering from a dog bite resulting in fever, chills and increased pain/swelling of his right hand.  He was started on Unasyn.  Hand surgery was consulted.  Patient underwent bedside irrigation and debridement x2 and culture sent off per hand surgeon, Dr. Amedeo Plenty.  Patient to OR for formal irrigation and debridement today 09/16/2017.     Assessment & Plan:   Principal Problem:   Cellulitis of right hand Active Problems:   Depression   Anemia in chronic kidney disease   Essential hypertension   Essential thrombocytosis (HCC)   BPH (benign prostatic hyperplasia)   CKD (chronic kidney disease) stage 3, GFR 30-59 ml/min (HCC)   Dog bite of right hand   Malnutrition of moderate degree   Cellulitis of right upper extremity  #1 sepsis secondary to right hand cellulitis after dog bite Improving daily.  Afebrile. WBC fluctuating and trending back down.  Patient status post bedside irrigation and debridement x2 by hand surgeon, Dr. Amedeo Plenty.  Patient seen by Dr. Amedeo Plenty with dressing changes and examination on 09/16/2017 where dressing change wound was examined and thick purulent material emanating from wound was noted despite irrigation and debridement x2 is felt patient has had a reaccumulation of infection.  Erythema improving. Dressing change per hand surgeon was done the morning of 09/16/2017.  Patient was taken to the operating room and underwent formal irrigation and debridement per hand surgeon 0/06/6008 with no complications noted.  Cultures still pending.  Per hand surgeon bandage was removed wound looked excellent with resolution of erythema and improvement of cellulitis with no signs of worsening infection.   Splint has been replaced per surgery.  Continue empiric IV Unasyn. Per Hand surgery.  2.  Leukocytosis Secondary to problem #1.  WBC was fluctuating and started to trend back down.  WBC currently at 15.9 from 18.4 from 11.2 from 15.8 from 19.6.  Wound cultures pending.  Likely increase in leukocytosis is reactive or secondary to I and D which was done in the OR 09/16/2017. leukocytosis trending down.  Awaiting cultures.  Continue empiric IV Unasyn for now.  Follow.    3.  Hypertension Blood pressure stable.  Terazosin.    4.  Essential thrombocytosis Platelet count improved and currently at 296.  Continue hydroxyurea.  5.  Chronic kidney disease stage III Baseline creatinine 1.5-1.6.  Renal function stable.  Follow.    6.  BPH Continue terazosin.  7.  Anemia of chronic kidney disease H&H stable.  8.  Depression Continue Prozac.       DVT prophylaxis: SCDs Code Status: Full Family Communication: Updated patient.  No family at bedside.  Disposition Plan: Home once medically stable, clinically improved, cultures have resulted and per hand surgeon.   Consultants:   Hand surgery: Dr. Amedeo Plenty 09/14/2017  Procedures:   Bedside irrigation and debridement x2 per Dr. Amedeo Plenty  Plain films of the right hand 09/13/2017   Irrigation and debridement of skin, subcutaneous tissue and muscle as well as tendon tissue/radical tenosynovectomy, APL and he EB tendon, secondary to infectious tenosynovitis.  This was a radical tenolysis and tenosynovectomy of the extensor tendon apparatus with first dorsal compartment decompression.----Dr. Amedeo Plenty 09/16/2017  Antimicrobials:   IV Unasyn 09/13/2017   Subjective: Patient sitting up in  the chair.  Complaining of some right elbow muscle pain.  No shortness of breath.  No chest pain.  Tolerating oral intake.    Objective: Vitals:   09/17/17 2057 09/17/17 2211 09/18/17 0423 09/18/17 1406  BP: (!) 129/57 121/60 124/60 124/60  Pulse: 82  78 85  Resp: 18   18 17   Temp: 98.1 F (36.7 C)  97.6 F (36.4 C) 98.7 F (37.1 C)  TempSrc: Oral  Oral Oral  SpO2: 98%  95% 97%  Weight:      Height:        Intake/Output Summary (Last 24 hours) at 09/18/2017 1428 Last data filed at 09/18/2017 1415 Gross per 24 hour  Intake 400 ml  Output 650 ml  Net -250 ml   Filed Weights   09/14/17 0223  Weight: 78 kg (171 lb 15.3 oz)    Examination:  General exam: NAD Respiratory system: Lungs clear to auscultation bilaterally with no wheezes, no rhonchi, no crackles.  Normal respiratory effort.  Cardiovascular system: RRR no murmurs rubs or gallops.  No lower extremity edema.  Gastrointestinal system: Abdomen is nontender, nondistended, soft, positive bowel sounds.  No rebound.  No guarding.   Central nervous system: Alert and oriented. No focal neurological . Extremities: Right upper extremity bandaged.   Skin: No rashes, lesions or ulcers Psychiatry: Judgement and insight appear normal. Mood & affect appropriate.     Data Reviewed: I have personally reviewed following labs and imaging studies  CBC: Recent Labs  Lab 09/13/17 2149 09/14/17 0413 09/15/17 0631 09/16/17 0627 09/17/17 0535 09/18/17 0642  WBC 19.6* 15.8* 12.3* 11.2* 18.4* 15.9*  NEUTROABS 17.4*  --   --  7.5 13.4* 11.7*  HGB 10.2* 9.3* 9.0* 9.4* 8.5* 8.7*  HCT 31.6* 29.2* 27.8* 30.1* 25.6* 26.7*  MCV 110.1* 108.6* 109.9* 107.5* 108.9* 109.0*  PLT 313 261 233 228 246 675   Basic Metabolic Panel: Recent Labs  Lab 09/14/17 0413 09/15/17 0631 09/16/17 0627 09/17/17 0535 09/18/17 0642  NA 138 137 138 135 138  K 4.4 4.1 3.8 4.3 4.2  CL 106 108 108 104 106  CO2 18* 20* 20* 21* 22  GLUCOSE 114* 84 88 124* 89  BUN 28* 23* 24* 23* 24*  CREATININE 1.43* 1.35* 1.40* 1.46* 1.60*  CALCIUM 8.4* 8.0* 8.3* 8.1* 8.2*  MG  --   --  2.1  --   --    GFR: Estimated Creatinine Clearance: 31.5 mL/min (A) (by C-G formula based on SCr of 1.6 mg/dL (H)). Liver Function Tests: Recent Labs    Lab 09/13/17 2149  AST 20  ALT 14*  ALKPHOS 71  BILITOT 1.0  PROT 7.1  ALBUMIN 3.6   No results for input(s): LIPASE, AMYLASE in the last 168 hours. No results for input(s): AMMONIA in the last 168 hours. Coagulation Profile: Recent Labs  Lab 09/14/17 0116  INR 1.25   Cardiac Enzymes: No results for input(s): CKTOTAL, CKMB, CKMBINDEX, TROPONINI in the last 168 hours. BNP (last 3 results) No results for input(s): PROBNP in the last 8760 hours. HbA1C: No results for input(s): HGBA1C in the last 72 hours. CBG: No results for input(s): GLUCAP in the last 168 hours. Lipid Profile: No results for input(s): CHOL, HDL, LDLCALC, TRIG, CHOLHDL, LDLDIRECT in the last 72 hours. Thyroid Function Tests: No results for input(s): TSH, T4TOTAL, FREET4, T3FREE, THYROIDAB in the last 72 hours. Anemia Panel: Recent Labs    09/16/17 1022  VITAMINB12 558  FOLATE 15.4  FERRITIN 289  TIBC 255  IRON 66  RETICCTPCT 2.1   Sepsis Labs: Recent Labs  Lab 09/13/17 2229 09/14/17 0116 09/14/17 0117  PROCALCITON  --  0.12  --   LATICACIDVEN 1.79  --  1.1    Recent Results (from the past 240 hour(s))  Culture, blood (x 2)     Status: None (Preliminary result)   Collection Time: 09/14/17 12:30 AM  Result Value Ref Range Status   Specimen Description BLOOD RIGHT ARM  Final   Special Requests   Final    BOTTLES DRAWN AEROBIC AND ANAEROBIC Blood Culture results may not be optimal due to an excessive volume of blood received in culture bottles   Culture   Final    NO GROWTH 3 DAYS Performed at [facility], [place] [address] [place], [place] [address]    Report Status PENDING  Incomplete  Culture, blood (x 2)     Status: None (Preliminary result)   Collection Time: 09/14/17  1:14 AM  Result Value Ref Range Status   Specimen Description BLOOD LEFT HAND  Final   Special Requests   Final    IN PEDIATRIC BOTTLE Blood Culture results may not be optimal due to an excessive volume of blood  received in culture bottles   Culture   Final    NO GROWTH 3 DAYS Performed at [facility], [place] [address] [place], [place] [address]    Report Status PENDING  Incomplete  Aerobic Culture (superficial specimen)     Status: None (Preliminary result)   Collection Time: 09/15/17 10:18 AM  Result Value Ref Range Status   Specimen Description WOUND HAND RIGHT  Final   Special Requests NONE  Final   Gram Stain   Final    FEW WBC PRESENT, PREDOMINANTLY PMN MODERATE GRAM POSITIVE COCCI MODERATE GRAM NEGATIVE RODS MODERATE GRAM POSITIVE RODS    Culture   Final    CULTURE REINCUBATED FOR BETTER GROWTH Performed at [facility], [place] [address], [place], [place] [address]    Report Status PENDING  Incomplete  MRSA PCR Screening     Status: None   Collection Time: 09/16/17 11:16 AM  Result Value Ref Range Status   MRSA by PCR NEGATIVE NEGATIVE Final    Comment:        The GeneXpert MRSA Assay (FDA approved for NASAL specimens only), is one component of a comprehensive MRSA colonization surveillance program. It is not intended to diagnose MRSA infection nor to guide or monitor treatment for MRSA infections. Performed at [facility], [place] [address], [place],  [address]          Radiology Studies: No results found.      Scheduled Meds: . aspirin EC  81 mg Oral Daily  . FLUoxetine  50 mg Oral QHS  . hydroxyurea  500 mg Oral Q breakfast  . loratadine  10 mg Oral Daily  . multivitamin  1 tablet Oral Daily  . saccharomyces boulardii  250 mg Oral BID  . terazosin  2 mg Oral QHS   Continuous Infusions: . ampicillin-sulbactam (UNASYN) IV Stopped (09/18/17 1207)  . lactated ringers Stopped (09/16/17 2215)  . sodium chloride       LOS: 4 days    Time spent: 35 mins    Irine Seal, MD [facility] Pager [phone]  If 7PM-7AM, please contact night-coverage [email or web address] Password [email or web address] 09/18/2017, 2:28 PM

## 2017-09-18 NOTE — Progress Notes (Signed)
Patient ID: Nicholas [name], male   DOB: 01/28/1928, 84 y.o.   MRN: [id number] Patient is seen and examined at bedside.  Patient has his bandage removed.  All wound conditions look excellent.  I am quite pleased to see resolution of his erythema and cellulitis and no signs of worsening infection.  I replaced the splint.  I discussed with the patient that at present time I do feel comfortable with discharge tomorrow on antibiotics.  Hopefully his cultures will be final then.  Once again wound conditions are much improved in general.  He has no cellulitic areas reaccumulation of purulence or concerning findings.  I would recommend transition to p.o. antibiotics once cultures are final and follow-up in my office mid week.  I will see him tomorrow.  All questions have been addressed with he and his family.  Fatumata Kashani MD

## 2017-09-19 LAB — CULTURE, BLOOD (ROUTINE X 2)
Culture: NO GROWTH
Culture: NO GROWTH

## 2017-09-19 LAB — CBC WITH DIFFERENTIAL/PLATELET
Basophils Absolute: 0.2 10*3/uL — ABNORMAL HIGH (ref 0.0–0.1)
Basophils Relative: 1 %
EOS PCT: 0 %
Eosinophils Absolute: 0 10*3/uL (ref 0.0–0.7)
HCT: 27.5 % — ABNORMAL LOW (ref 39.0–52.0)
Hemoglobin: 8.7 g/dL — ABNORMAL LOW (ref 13.0–17.0)
LYMPHS ABS: 1.8 10*3/uL (ref 0.7–4.0)
Lymphocytes Relative: 12 %
MCH: 34.5 pg — ABNORMAL HIGH (ref 26.0–34.0)
MCHC: 31.6 g/dL (ref 30.0–36.0)
MCV: 109.1 fL — AB (ref 78.0–100.0)
Monocytes Absolute: 2.4 10*3/uL — ABNORMAL HIGH (ref 0.1–1.0)
Monocytes Relative: 16 %
NEUTROS ABS: 10.7 10*3/uL — AB (ref 1.7–7.7)
Neutrophils Relative %: 71 %
PLATELETS: 298 10*3/uL (ref 150–400)
RBC: 2.52 MIL/uL — AB (ref 4.22–5.81)
RDW: 22.2 % — ABNORMAL HIGH (ref 11.5–15.5)
WBC: 15.1 10*3/uL — AB (ref 4.0–10.5)

## 2017-09-19 LAB — BASIC METABOLIC PANEL
ANION GAP: 10 (ref 5–15)
BUN: 28 mg/dL — AB (ref 6–20)
CALCIUM: 8 mg/dL — AB (ref 8.9–10.3)
CO2: 21 mmol/L — ABNORMAL LOW (ref 22–32)
CREATININE: 1.5 mg/dL — AB (ref 0.61–1.24)
Chloride: 106 mmol/L (ref 101–111)
GFR calc Af Amer: 46 mL/min — ABNORMAL LOW (ref >60)
GFR, EST NON AFRICAN AMERICAN: 40 mL/min — AB (ref >60)
GLUCOSE: 95 mg/dL (ref 65–99)
Potassium: 4.1 mmol/L (ref 3.5–5.1)
Sodium: 137 mmol/L (ref 135–145)

## 2017-09-19 MED ORDER — IBUPROFEN 400 MG PO TABS: 400.0000 mg | ORAL_TABLET | Freq: Four times a day (QID) | ORAL | 0 refills | Status: DC | PRN

## 2017-09-19 MED ORDER — SACCHAROMYCES BOULARDII 250 MG PO CAPS: 250.0000 mg | ORAL_CAPSULE | Freq: Two times a day (BID) | ORAL | Status: AC

## 2017-09-19 MED ORDER — AMOXICILLIN-POT CLAVULANATE 875-125 MG PO TABS: 1.0000 | ORAL_TABLET | Freq: Two times a day (BID) | ORAL | 0 refills | Status: AC

## 2017-09-19 NOTE — Progress Notes (Signed)
Occupational Therapy Treatment Patient Details Name: Nicholas [name] MRN: [id number] DOB: March 03, 1928 Today's Date: 09/19/2017    History of present illness Pt is an 84 y.o. male admitted secondary to increased redness and swelling from a dog bite in R hand. Suspected cellulitis. Per note, possible OR on 4/5 for I and D of  R hand. PMH includes HTN and CKD. Pt now s/p I&D of Rt UE.   OT comments  Focus of session on ADL management. Pt with increased difficulty with LB ADL and is at risk for falls. Pt issued long handled shoe horn and elastic laces to help with LB ADL. Pt mobilized with S. Pt required multiple vc for new learning. Recommend follow up OT in home setting.   Follow Up Recommendations  Home health OT;Supervision/Assistance - 24 hour    Equipment Recommendations  None recommended by OT    Recommendations for Other Services      Precautions / Restrictions Precautions Precautions: Fall Restrictions Weight Bearing Restrictions: No       Mobility Bed Mobility                  Transfers Overall transfer level: Needs assistance   Transfers: Sit to/from Stand Sit to Stand: Supervision              Balance             Standing balance-Leahy Scale: Fair                             ADL either performed or assessed with clinical judgement   ADL Overall ADL's : Needs assistance/impaired             Lower Body Bathing: Minimal assistance;Sit to/from stand       Lower Body Dressing: Moderate assistance;Sit to/from stand Lower Body Dressing Details (indicate cue type and reason): most difficulty wtih donning shoes/socks; unablet o use sock aid. Worked on using elastic shoe strings and long handled Environmental manager: Supervision/safety;Ambulation     Toileting - Clothing Manipulation Details (indicate cue type and reason): Discussed clothing types that would be easier for pt to manage and reduce risk of falls     Functional  mobility during ADLs: Supervision/safety General ADL Comments: Needs further education     Vision   Additional Comments: Pt reports difficulty wtih vision   Perception     Praxis      Cognition Arousal/Alertness: Awake/alert Behavior During Therapy: WFL for tasks assessed/performed                                   General Comments: Pt required multiple vc and repetitions to complete ADL tasks - trying to learn how to put socks/shoes on with 1 hand. Daughter present and appeared to get easily frustrated with her Dad; would benefit from further assessmetn of cognition in home environment with fmailiar settings        Exercises     Shoulder Instructions       General Comments      Pertinent Vitals/ Pain       Pain Assessment: Faces Faces Pain Scale: Hurts little more Pain Location: RUE with elbow extension Pain Descriptors / Indicators: Discomfort;Grimacing Pain Intervention(s): Limited activity within patient's tolerance  Home Living  Prior Functioning/Environment              Frequency  Min 3X/week        Progress Toward Goals  OT Goals(current goals can now be found in the care plan section)  Progress towards OT goals: Progressing toward goals  Acute Rehab OT Goals Patient Stated Goal: to be independent OT Goal Formulation: With patient Time For Goal Achievement: 09/30/17 Potential to Achieve Goals: Good ADL Goals Pt Will Perform Lower Body Bathing: with modified independence;with adaptive equipment;sit to/from stand Pt Will Perform Lower Body Dressing: with modified independence;sit to/from stand;with adaptive equipment Pt Will Perform Tub/Shower Transfer: Tub transfer;with supervision;ambulating;shower seat;grab bars Additional ADL Goal #1: Pt will gather ADL items and perform ADL with mod I. Additional ADL Goal #2: Pt will independnetly verbally recall 3 edema management  strategies for RUE.  Plan Discharge plan needs to be updated;Frequency needs to be updated    Co-evaluation                 AM-PAC PT "6 Clicks" Daily Activity     Outcome Measure   Help from another person eating meals?: A Little Help from another person taking care of personal grooming?: A Little Help from another person toileting, which includes using toliet, bedpan, or urinal?: A Little Help from another person bathing (including washing, rinsing, drying)?: A Little Help from another person to put on and taking off regular upper body clothing?: A Little Help from another person to put on and taking off regular lower body clothing?: A Little 6 Click Score: 18    End of Session    OT Visit Diagnosis: Unsteadiness on feet (R26.81);Muscle weakness (generalized) (M62.81);Pain Pain - Right/Left: Right Pain - part of body: Arm   Activity Tolerance Patient tolerated treatment well   Patient Left in chair;with call bell/phone within reach;with family/visitor present   Nurse Communication          Time: 1000-1030 OT Time Calculation (min): 30 min  Charges: OT General Charges $OT Visit: 1 Visit OT Treatments $Self Care/Home Management : 23-37 mins  [facility], OT/L  [phone] 09/19/2017   [name],[name] 09/19/2017, 10:41 AM

## 2017-09-19 NOTE — Discharge Summary (Signed)
Physician Discharge Summary  Nicholas [name] XKG:[id number] DOB: 06/05/1928 DOA: 09/13/2017  PCP: Denita Lung, MD  Admit date: 09/13/2017 Discharge date: 09/19/2017  Time spent: 55 minutes  Recommendations for Outpatient Follow-up:  1. Follow-up with Dr. Amedeo Plenty in 2 days for further evaluation of right hand wound and dressing change.  Wound cultures and sensitivities will need to be followed up upon as patient is being discharged on Augmentin. 2. Patient will be discharged home with home health PT/OT.   Discharge Diagnoses:  Principal Problem:   Cellulitis of right hand Active Problems:   Depression   Anemia in chronic kidney disease   Essential hypertension   Essential thrombocytosis (HCC)   BPH (benign prostatic hyperplasia)   CKD (chronic kidney disease) stage 3, GFR 30-59 ml/min (HCC)   Dog bite of right hand   Malnutrition of moderate degree   Cellulitis of right upper extremity   Discharge Condition: stable and improved  Diet recommendation: Heart healthy  Filed Weights   09/14/17 0223  Weight: 78 kg (171 lb 15.3 oz)    History of present illness:  Per Dr.Niu  Nicholas [name] is a 84 y.o. male with medical history significant of hypertension, depression, essential thrombocytopenia, chronic sinusitis, anemia, CKD-3, BPH, who presented with right hand pain after dog bite.  Pt stated that he was bitten by his pet dog the day prior to admission. His right hand became swollen and painful on day of admission. The right hand was erythematous. The pain was constant, severe, 8 out of 10 in severity, sharp, nonradiating, aggravated with movement. There was some dark colored bloody draining coming out from the site of dog bite. He also had fever of 100 and chills. He stated that the dog was current on its rabies vaccinations.patient had some mild cough, but no chest pain, shortness of breath. Denied nausea, vomiting, diarrhea, abdominal pain, symptoms of UTI or unilateral  weakness.  ED Course: pt was found to have WBC 19.6, lactic acid 1.79, negative urinalysis, stable renal function, temperature 100, no tachycardia, no tachypnea, oxygen saturation 100% on room air. X-ray of the right hand showed no bony fracture. Pt was admitted to [facility] bed as inpatient. Hand surgeon, Dr. Amedeo Plenty was consulted.    Hospital Course:  #1 sepsis secondary to right hand cellulitis after dog bite Patient was admitted felt to be septic secondary to right hand cellulitis after dog bite.  Patient was noted to have a leukocytosis with a white count of 19.6, temperature 101, lactic acid level 1.79.  Patient was admitted to [place] bed placed on empiric IV fluids as well as empirically on IV antibiotics of Unasyn.  Daily labs were obtained and hand surgery consulted.  Patient was seen in consultation by Dr. Amedeo Plenty.  Patient subsequently underwent bedside irrigation and debridement x2 by hand surgeon, Dr. Amedeo Plenty.  Patient seen by Dr. Amedeo Plenty with dressing changes and examination on 09/16/2017 where dressing change wound was examined and thick purulent material emanating from wound was noted despite irrigation and debridement x2 is felt patient has had a reaccumulation of infection.  Erythema improved. Dressing change per hand surgeon was done the morning of 09/16/2017.  Patient was taken to the operating room and underwent formal irrigation and debridement per hand surgeon 7/0/2637 with no complications noted.  Cultures still pending at time of discharge.  Per hand surgeon bandage was removed wound looked excellent with resolution of erythema and improvement of cellulitis with no signs of worsening infection.  Splint has been replaced per surgery.  Patient was maintained on IV Unasyn throughout the hospitalization.  Patient will be transitioned to oral Augmentin and a 2-week course has been prescribed per hand surgery recommendations.  Outpatient follow-up with hand surgery.  2.  Leukocytosis Secondary  to problem #1.  WBC was fluctuating and started to trend back down.  WBC currently at 15.1 on day of discharge from 15.9 from 18.4 from 11.2 from 15.8 from 19.6.  Wound cultures obtained were pending on discharge and will be followed up upon by hand surgery.  Patient's transient increase in leukocytosis was  likely reactive or secondary to I and D which was done in the OR 09/16/2017. leukocytosis  added to trend back down.  Patient remained afebrile.  Patient was on empiric IV Unasyn throughout the hospitalization and will be discharged home on 2 weeks of oral Augmentin per hand surgeon recommendations.  Outpatient follow-up.   3.  Hypertension Blood pressure remained stable on terazosin.  4.  Essential thrombocytosis Platelet count improved and was at 298 on day of discharge.  Patient was maintained on his home regimen of hydroxyurea.    5.  Chronic kidney disease stage III Baseline creatinine 1.5-1.6.  Renal function was stable throughout the hospitalization.    6.  BPH Continued on home regimen of terazosin.  7.  Anemia of chronic kidney disease H&H remained stable.  8.  Depression Patient was maintained on home regimen of Prozac.     Procedures:  Bedside irrigation and debridement x2 per Dr. Amedeo Plenty  Plain films of the right hand 09/13/2017   Irrigation and debridement of skin, subcutaneous tissue and muscle as well as tendon tissue/radical tenosynovectomy, APL and he EB tendon, secondary to infectious tenosynovitis.  This was a radical tenolysis and tenosynovectomy of the extensor tendon apparatus with first dorsal compartment decompression.----Dr. Amedeo Plenty 09/16/2017      Consultations:  Hand surgery: Dr. Amedeo Plenty 09/14/2017      Discharge Exam: Vitals:   09/18/17 2212 09/19/17 0544  BP: (!) 130/56 (!) 134/58  Pulse: 74 69  Resp: 17 17  Temp: 98.3 F (36.8 C) 98.4 F (36.9 C)  SpO2: 94% 94%    General: NAD Cardiovascular: RRR Respiratory: CTAB  Discharge  Instructions   Discharge Instructions    Diet - low sodium heart healthy   Complete by:  As directed    Increase activity slowly   Complete by:  As directed      Allergies as of 09/19/2017      Reactions   Tape Other (See Comments)   The patient's SKIN IS VERY THIN AND TEARS AND BRUISES VERY EASILY!!   Erythromycin Other (See Comments)   Gums turned red and bled profusely      Medication List    STOP taking these medications   anagrelide 0.5 MG capsule Commonly known as:  AGRYLIN     TAKE these medications   acetaminophen 650 MG CR tablet Commonly known as:  TYLENOL Take 650 mg by mouth at bedtime.   amoxicillin-clavulanate 875-125 MG tablet Commonly known as:  AUGMENTIN Take 1 tablet by mouth 2 (two) times daily for 14 days.   aspirin 81 MG tablet Take 1 tablet (81 mg total) by mouth daily.   cetirizine 10 MG tablet Commonly known as:  ZYRTEC Take 10 mg by mouth at bedtime.   [employer] VITAMIN D3 1000 units capsule Generic drug:  Cholecalciferol TAKE 1 TABLET (1,000 UNITS TOTAL) BY MOUTH DAILY.   [employer] VITAMIN  D3 1000 units capsule Generic drug:  Cholecalciferol TAKE 1 TABLET (1,000 UNITS TOTAL) BY MOUTH DAILY.   FLUoxetine 40 MG capsule Commonly known as:  PROZAC Take 1 capsule (40 mg total) by mouth daily. What changed:  when to take this   FLUoxetine 10 MG tablet Commonly known as:  PROZAC Take 1 tablet (10 mg total) by mouth daily. What changed:  when to take this   hydroxyurea 500 MG capsule Commonly known as:  HYDREA Take 1 capsule (500 mg total) by mouth daily. May take with food to minimize GI side effects.   ibuprofen 400 MG tablet Commonly known as:  ADVIL,MOTRIN Take 1 tablet (400 mg total) by mouth every 6 (six) hours as needed for fever, headache or mild pain.   PRESERVISION AREDS 2 Caps Take 1 capsule by mouth 2 (two) times daily.   saccharomyces boulardii 250 MG capsule Commonly known as:  FLORASTOR Take 1 capsule (250 mg total) by mouth 2  (two) times daily.   sodium chloride 0.65 % nasal spray Commonly known as:  OCEAN Place 1 spray into the nose 4 (four) times daily as needed for congestion.   SYSTANE PRESERVATIVE FREE 0.4-0.3 % Soln Generic drug:  Polyethyl Glyc-Propyl Glyc PF Place 1 drop into both eyes daily as needed (for irritation).   terazosin 2 MG capsule Commonly known as:  HYTRIN Take 1 capsule (2 mg total) by mouth daily. What changed:  when to take this            Durable Medical Equipment  (From admission, onward)        Start     Ordered   09/19/17 1227  For home use only DME Cane  Once     09/19/17 1227     Allergies  Allergen Reactions  . Tape Other (See Comments)    The patient's SKIN IS VERY THIN AND TEARS AND BRUISES VERY EASILY!!  . Erythromycin Other (See Comments)    Gums turned red and bled profusely   Follow-up Information    Roseanne Kaufman, MD Follow up in 2 day(s).   Specialty:  Orthopedic Surgery Why:  Please come to the office at 11 AM Wednesday to see Dr. Rose Phi information: [address] [place] [address] [phone]        [facility] Follow up.   Specialty:  Home Health Services Why:  To provide home health PT and OT Contact information: [place] [address] [phone]            The results of significant diagnostics from this hospitalization (including imaging, microbiology, ancillary and laboratory) are listed below for reference.    Significant Diagnostic Studies: Dg Hand Complete Right  Result Date: 09/13/2017 CLINICAL DATA:  Dog bite to right thumb. EXAM: RIGHT HAND - COMPLETE 3+ VIEW COMPARISON:  None. FINDINGS: No acute fracture or dislocation. Moderate osteoarthritis of the DIP joints and third MCP joint. Mild osteoarthritis of the thumb IP joint, PIP joints, and scaphotrapeziotrapezoid joint. Diffuse osteopenia. Vascular calcifications. IMPRESSION: 1. No acute osseous abnormality.  2. 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 Electronically Signed   By: Titus Dubin M.D.   On: 09/13/2017 23:19    Microbiology: Recent Results (from the past 240 hour(s))  Culture, blood (x 2)     Status: None (Preliminary result)   Collection Time: 09/14/17 12:30 AM  Result Value Ref Range Status   Specimen Description BLOOD RIGHT ARM  Final   Special  Requests   Final    BOTTLES DRAWN AEROBIC AND ANAEROBIC Blood Culture results may not be optimal due to an excessive volume of blood received in culture bottles   Culture   Final    NO GROWTH 4 DAYS Performed at [facility], [place] [address] [place], [place] [address]    Report Status PENDING  Incomplete  Culture, blood (x 2)     Status: None (Preliminary result)   Collection Time: 09/14/17  1:14 AM  Result Value Ref Range Status   Specimen Description BLOOD LEFT HAND  Final   Special Requests   Final    IN PEDIATRIC BOTTLE Blood Culture results may not be optimal due to an excessive volume of blood received in culture bottles   Culture   Final    NO GROWTH 4 DAYS Performed at [facility], [place] [address] [place], [place] [address]    Report Status PENDING  Incomplete  Aerobic Culture (superficial specimen)     Status: None (Preliminary result)   Collection Time: 09/15/17 10:18 AM  Result Value Ref Range Status   Specimen Description WOUND HAND RIGHT  Final   Special Requests NONE  Final   Gram Stain   Final    FEW WBC PRESENT, PREDOMINANTLY PMN MODERATE GRAM POSITIVE COCCI MODERATE GRAM NEGATIVE RODS MODERATE GRAM POSITIVE RODS    Culture   Final    RARE GRAM NEGATIVE COCCOBACILLI IDENTIFICATION TO FOLLOW Performed at [facility], [place] [address] [place], [place] [address]    Report Status PENDING  Incomplete  MRSA PCR Screening     Status: None   Collection Time: 09/16/17 11:16 AM  Result Value Ref Range Status   MRSA by PCR NEGATIVE NEGATIVE Final    Comment:        The GeneXpert MRSA  Assay (FDA approved for NASAL specimens only), is one component of a comprehensive MRSA colonization surveillance program. It is not intended to diagnose MRSA infection nor to guide or monitor treatment for MRSA infections. Performed at [facility], [place] [address], [place], [place] [address]      Labs: Basic Metabolic Panel: Recent Labs  Lab 09/15/17 0631 09/16/17 6948 09/17/17 0535 09/18/17 0642 09/19/17 0611  NA 137 138 135 138 137  K 4.1 3.8 4.3 4.2 4.1  CL 108 108 104 106 106  CO2 20* 20* 21* 22 21*  GLUCOSE 84 88 124* 89 95  BUN 23* 24* 23* 24* 28*  CREATININE 1.35* 1.40* 1.46* 1.60* 1.50*  CALCIUM 8.0* 8.3* 8.1* 8.2* 8.0*  MG  --  2.1  --   --   --    Liver Function Tests: Recent Labs  Lab 09/13/17 2149  AST 20  ALT 14*  ALKPHOS 71  BILITOT 1.0  PROT 7.1  ALBUMIN 3.6   No results for input(s): LIPASE, AMYLASE in the last 168 hours. No results for input(s): AMMONIA in the last 168 hours. CBC: Recent Labs  Lab 09/13/17 2149  09/15/17 0631 09/16/17 0627 09/17/17 0535 09/18/17 0642 09/19/17 0611  WBC 19.6*   < > 12.3* 11.2* 18.4* 15.9* 15.1*  NEUTROABS 17.4*  --   --  7.5 13.4* 11.7* 10.7*  HGB 10.2*   < > 9.0* 9.4* 8.5* 8.7* 8.7*  HCT 31.6*   < > 27.8* 30.1* 25.6* 26.7* 27.5*  MCV 110.1*   < > 109.9* 107.5* 108.9* 109.0* 109.1*  PLT 313   < > 233 228 246  296 298   < > = values in this interval not displayed.   Cardiac Enzymes: No results for input(s): CKTOTAL, CKMB, CKMBINDEX, TROPONINI in the last 168 hours. BNP: BNP (last 3 results) No results for input(s): BNP in the last 8760 hours.  ProBNP (last 3 results) No results for input(s): PROBNP in the last 8760 hours.  CBG: No results for input(s): GLUCAP in the last 168 hours.     Signed:  Irine Seal MD.  [facility] 09/19/2017, 12:37 PM

## 2017-09-19 NOTE — Progress Notes (Signed)
Physical Therapy Treatment Patient Details Name: Nicholas [name] MRN: [id number] DOB: Jul 21, 1927 Today's Date: 09/19/2017    History of Present Illness Pt is an 84 y/o male admitted secondary to increased redness and swelling from a dog bite in R hand. Suspected cellulitis. Per note, possible OR on 4/5 for I and D of  R hand. PMH includes HTN and CKD.     PT Comments    Patient is making good progress with PT.  From a mobility standpoint anticipate patient will be ready for DC home when medically ready.    Follow Up Recommendations  Home health PT;Supervision for mobility/OOB     Equipment Recommendations  3in1 (PT);Cane    Recommendations for Other Services OT consult     Precautions / Restrictions Precautions Precautions: Fall    Mobility  Bed Mobility               General bed mobility comments: pt OOB in chair upon arrival  Transfers Overall transfer level: Needs assistance Equipment used: None Transfers: Sit to/from Stand Sit to Stand: Supervision         General transfer comment: supervision for safety  Ambulation/Gait Ambulation/Gait assistance: Min guard;Supervision Ambulation Distance (Feet): 200 Feet Assistive device: Straight cane Gait Pattern/deviations: Step-through pattern;Trunk flexed;Decreased stride length Gait velocity: Decreased    General Gait Details: cues for sequencing of gait with use of AD; pt more steady and with safer stride length/cadence with use of SPC   Stairs Stairs: Yes   Stair Management: One rail Left;Step to pattern;Forwards Number of Stairs: 10 General stair comments: cues for technique and safety; pt has L hand rail so practiced ascending with use of L hand rail and descending using SPC; pt is unsteady with SpC only when descending steps and educated on going sideways using rail   Wheelchair Mobility    Modified Rankin (Stroke Patients Only)       Balance Overall balance assessment: Needs  assistance Sitting-balance support: No upper extremity supported;Feet supported Sitting balance-Leahy Scale: Good     Standing balance support: During functional activity;Single extremity supported;No upper extremity supported Standing balance-Leahy Scale: Fair Standing balance comment: Able to maintain static standing without UE support.                             Cognition Arousal/Alertness: Awake/alert Behavior During Therapy: WFL for tasks assessed/performed Overall Cognitive Status: Within Functional Limits for tasks assessed                                 General Comments: Pt required multiple vc and repetitions to complete ADL tasks - trying to learn how to put socks/shoes on with 1 hand. Daughter present and appeared to get easily frustrated with her Dad; would benefit from further assessmetn of cognition in home environment with fmailiar settings      Exercises      General Comments        Pertinent Vitals/Pain Pain Assessment: Faces Faces Pain Scale: Hurts a little bit Pain Location: R UE Pain Descriptors / Indicators: Discomfort;Guarding Pain Intervention(s): Monitored during session;Premedicated before session;Repositioned    Home Living                      Prior Function            PT Goals (current goals can now be found  in the care plan section) Acute Rehab PT Goals Patient Stated Goal: to be independent PT Goal Formulation: With patient Time For Goal Achievement: 09/30/17 Potential to Achieve Goals: Good Progress towards PT goals: Progressing toward goals    Frequency    Min 3X/week      PT Plan Current plan remains appropriate    Co-evaluation              AM-PAC PT "6 Clicks" Daily Activity  Outcome Measure  Difficulty turning over in bed (including adjusting bedclothes, sheets and blankets)?: A Little Difficulty moving from lying on back to sitting on the side of the bed? : A  Little Difficulty sitting down on and standing up from a chair with arms (e.g., wheelchair, bedside commode, etc,.)?: Unable Help needed moving to and from a bed to chair (including a wheelchair)?: A Little Help needed walking in hospital room?: A Little Help needed climbing 3-5 steps with a railing? : A Little 6 Click Score: 16    End of Session Equipment Utilized During Treatment: Gait belt Activity Tolerance: Patient tolerated treatment well Patient left: in chair;with call bell/phone within reach;with family/visitor present Nurse Communication: Mobility status PT Visit Diagnosis: Unsteadiness on feet (R26.81);Muscle weakness (generalized) (M62.81)     Time: 9166-0600 PT Time Calculation (min) (ACUTE ONLY): 16 min  Charges:  $Gait Training: 8-22 mins                    G Codes:       Earney Navy, PTA Pager: [phone]     Darliss Cheney 09/19/2017, 1:32 PM

## 2017-09-19 NOTE — Care Management Note (Addendum)
Case Management Note  Patient Details  Name: Nicholas [name] MRN: [id number] Date of Birth: 1927-09-25  Subjective/Objective:                    Action/Plan: Cane ordered through [facility] Discussed discharge planning at bedside with patient and daughter provided list of agencies. Both requested NCM to call patient's daughter [name] [phone] who works at [employer] ( patient's PCP ) office for home health agency decision. Spoke with [name] she prefers [facility]. Referral given to Blanchard Mane with [facility]. Expected Discharge Date:  09/19/17               Expected Discharge Plan:  [place]  In-House Referral:     Discharge planning Services  CM Consult  Post Acute Care Choice:    Choice offered to:  Patient, Adult Children  DME Arranged:  N/A DME Agency:  NA  HH Arranged:  PT, OT HH Agency:  [facility]  Status of Service:  Completed, signed off  If discussed at [place] of Stay Meetings, dates discussed:    Additional Comments:  Marilu Favre, RN 09/19/2017, 11:48 AM

## 2017-09-19 NOTE — Progress Notes (Signed)
Nicholas [name] to be D/C'd  per MD order. Discussed with the patient and all questions fully answered.  VSS, Skin clean, dry and intact without evidence of skin break down, no evidence of skin tears noted.  IV catheter discontinued intact. Site without signs and symptoms of complications. Dressing and pressure applied.  An After Visit Summary was printed and given to the patient. Patient received prescription.  D/c education completed with patient/family including follow up instructions, medication list, d/c activities limitations if indicated, with other d/c instructions as indicated by MD - patient able to verbalize understanding, all questions fully answered.   Patient instructed to return to ED, call 911, or call MD for any changes in condition.   Patient to be escorted via [place], and D/C home via private auto.

## 2017-09-19 NOTE — Progress Notes (Signed)
OT Progress Note  Focus of session on RUE edema control, proper positioning and therapeutic exercise for functional movement patterns. Pt demonstrates difficulty with ADL. Will return with AE to increase pt's level of independence and reduce risk of falls. Daughter present for session. Daughter verbalized understanding need to have assistance with ADL and mobility initially. Recommend follow up with HHOT/PT/     09/19/17 0930  OT Visit Information  Last OT Received On 09/19/17  Assistance Needed +1  History of Present Illness Pt is an 84 y.o. male admitted secondary to increased redness and swelling from a dog bite in R hand. Suspected cellulitis. Per note, possible OR on 4/5 for I and D of  R hand. PMH includes HTN and CKD. Pt now s/p I&D of Rt UE.  Precautions  Precautions Fall  Pain Assessment  Pain Assessment Faces  Faces Pain Scale 4  Pain Location Rt UE  Pain Descriptors / Indicators Discomfort;Guarding;Grimacing  Pain Intervention(s) Limited activity within patient's tolerance;Repositioned  Cognition  Arousal/Alertness Awake/alert  Behavior During Therapy WFL for tasks assessed/performed  General Comments needs forther assessment in home environemtn  ADL  General ADL Comments issued red tubing to help with self feeding using R hand  Other Exercises  Other Exercises education on positionoing and edema mangemetn  Other Exercises grip/pinch strengthening with use of squeeze ball  Other Exercises contract/relax ex with R elbow flex/ext x 10 within pain tolerance  Other Exercises  R shoulder AROM within all planes for functional ROM  Other Exercises focus on hand to top of head/hand to mouth patterns  OT - End of Session  Activity Tolerance Patient tolerated treatment well  Patient left in chair;with call bell/phone within reach  Nurse Communication Other (comment) (DC needs)  OT Assessment/Plan  OT Plan Discharge plan remains appropriate  OT Visit Diagnosis Unsteadiness on  feet (R26.81);Muscle weakness (generalized) (M62.81);Pain  Pain - Right/Left Right  Pain - part of body Arm  OT Frequency (ACUTE ONLY) Min 3X/week  Follow Up Recommendations Home health OT;Supervision/Assistance - 24 hour  OT Equipment None recommended by OT  AM-PAC OT "6 Clicks" Daily Activity Outcome Measure  Help from another person eating meals? 3  Help from another person taking care of personal grooming? 3  Help from another person toileting, which includes using toliet, bedpan, or urinal? 3  Help from another person bathing (including washing, rinsing, drying)? 3  Help from another person to put on and taking off regular upper body clothing? 3  Help from another person to put on and taking off regular lower body clothing? 2  6 Click Score 17  ADL G Code Conversion CK  OT Goal Progression  Progress towards OT goals Progressing toward goals  Acute Rehab OT Goals  Patient Stated Goal to be independent  OT Goal Formulation With family  Time For Goal Achievement 09/30/17  Potential to Achieve Goals Good  ADL Goals  Pt Will Perform Tub/Shower Transfer Tub transfer;with supervision;ambulating;shower seat;grab bars  Additional ADL Goal #1 Pt will gather ADL items and perform ADL with mod I.  Additional ADL Goal #2 Pt will independnetly verbally recall 3 edema management strategies for RUE.  Pt Will Perform Lower Body Dressing with modified independence  Pt Will Perform Lower Body Bathing with modified independence  OT Time Calculation  OT Start Time (ACUTE ONLY) 0915  OT Stop Time (ACUTE ONLY) 0935  OT Time Calculation (min) 20 min  OT General Charges  $OT Visit 1 Visit  OT Treatments  $  Therapeutic Activity 8-22 mins  [facility], OT/L  [phone] 09/19/2017

## 2017-09-19 NOTE — Care Management Important Message (Signed)
Important Message  Patient Details  Name: Nicholas [name] MRN: [id number] Date of Birth: 1928/06/12   Medicare Important Message Given:  Yes    [name] [address] 09/19/2017, 2:38 PM

## 2017-09-19 NOTE — Consult Note (Addendum)
  Patient and I discussed all issues at bedside.  Patient's arm looks very good.  There is a little resolving erythema in the antecubital space but there is no evidence of an abscess.  His cultures have been growing out gram-negative rods.  Identifying species is yet to be determined.  At this juncture I am comfortable with the discharge to home and follow-up in my office Wednesday at 11 AM.  I discussed this with the patient and his family.  At present time I feel he is stable.  I discussed with him healthy living habits and other issues germane to his predicament.  I would expect/recommend Augmentin given the animal bite 875 mg twice daily times 2 weeks or other accepted soft tissue coverage  Jatavia Keltner [phone]

## 2017-09-20 ENCOUNTER — Ambulatory Visit: Payer: Medicare Other

## 2017-09-20 ENCOUNTER — Other Ambulatory Visit: Payer: Medicare Other

## 2017-09-20 ENCOUNTER — Ambulatory Visit: Payer: Medicare Other | Admitting: Oncology

## 2017-09-20 DIAGNOSIS — L03818 Cellulitis of other sites: Secondary | ICD-10-CM | POA: Diagnosis not present

## 2017-09-20 DIAGNOSIS — F329 Major depressive disorder, single episode, unspecified: Secondary | ICD-10-CM | POA: Diagnosis not present

## 2017-09-20 DIAGNOSIS — D631 Anemia in chronic kidney disease: Secondary | ICD-10-CM | POA: Diagnosis not present

## 2017-09-20 DIAGNOSIS — M6281 Muscle weakness (generalized): Secondary | ICD-10-CM | POA: Diagnosis not present

## 2017-09-20 DIAGNOSIS — I129 Hypertensive chronic kidney disease with stage 1 through stage 4 chronic kidney disease, or unspecified chronic kidney disease: Secondary | ICD-10-CM | POA: Diagnosis not present

## 2017-09-20 DIAGNOSIS — N183 Chronic kidney disease, stage 3 (moderate): Secondary | ICD-10-CM | POA: Diagnosis not present

## 2017-09-20 LAB — AEROBIC CULTURE W GRAM STAIN (SUPERFICIAL SPECIMEN)

## 2017-09-20 LAB — AEROBIC CULTURE  (SUPERFICIAL SPECIMEN)

## 2017-09-22 ENCOUNTER — Telehealth: Payer: Self-pay | Admitting: Family Medicine

## 2017-09-22 DIAGNOSIS — N183 Chronic kidney disease, stage 3 (moderate): Secondary | ICD-10-CM | POA: Diagnosis not present

## 2017-09-22 DIAGNOSIS — L03818 Cellulitis of other sites: Secondary | ICD-10-CM | POA: Diagnosis not present

## 2017-09-22 DIAGNOSIS — D631 Anemia in chronic kidney disease: Secondary | ICD-10-CM | POA: Diagnosis not present

## 2017-09-22 DIAGNOSIS — M6281 Muscle weakness (generalized): Secondary | ICD-10-CM | POA: Diagnosis not present

## 2017-09-22 DIAGNOSIS — F329 Major depressive disorder, single episode, unspecified: Secondary | ICD-10-CM | POA: Diagnosis not present

## 2017-09-22 DIAGNOSIS — I129 Hypertensive chronic kidney disease with stage 1 through stage 4 chronic kidney disease, or unspecified chronic kidney disease: Secondary | ICD-10-CM | POA: Diagnosis not present

## 2017-09-22 NOTE — Telephone Encounter (Signed)
Will Schalla from [facility] called [phone] needs verbal order for occupational therapy for 2 times a week for 2 weeks

## 2017-09-22 NOTE — Telephone Encounter (Signed)
ok 

## 2017-09-23 NOTE — Telephone Encounter (Signed)
Done [facility] 09-23-17

## 2017-09-26 ENCOUNTER — Telehealth: Payer: Self-pay

## 2017-09-26 DIAGNOSIS — D631 Anemia in chronic kidney disease: Secondary | ICD-10-CM | POA: Diagnosis not present

## 2017-09-26 DIAGNOSIS — N183 Chronic kidney disease, stage 3 (moderate): Secondary | ICD-10-CM | POA: Diagnosis not present

## 2017-09-26 DIAGNOSIS — L03818 Cellulitis of other sites: Secondary | ICD-10-CM | POA: Diagnosis not present

## 2017-09-26 DIAGNOSIS — M6281 Muscle weakness (generalized): Secondary | ICD-10-CM | POA: Diagnosis not present

## 2017-09-26 DIAGNOSIS — F329 Major depressive disorder, single episode, unspecified: Secondary | ICD-10-CM | POA: Diagnosis not present

## 2017-09-26 DIAGNOSIS — I129 Hypertensive chronic kidney disease with stage 1 through stage 4 chronic kidney disease, or unspecified chronic kidney disease: Secondary | ICD-10-CM | POA: Diagnosis not present

## 2017-09-26 NOTE — Telephone Encounter (Signed)
Patient called to r/s apt. with provider and injection. Transferred call to provider RN due to the valibuility. Per 4/15 phone que

## 2017-09-28 DIAGNOSIS — F329 Major depressive disorder, single episode, unspecified: Secondary | ICD-10-CM | POA: Diagnosis not present

## 2017-09-28 DIAGNOSIS — D631 Anemia in chronic kidney disease: Secondary | ICD-10-CM | POA: Diagnosis not present

## 2017-09-28 DIAGNOSIS — N183 Chronic kidney disease, stage 3 (moderate): Secondary | ICD-10-CM | POA: Diagnosis not present

## 2017-09-28 DIAGNOSIS — M6281 Muscle weakness (generalized): Secondary | ICD-10-CM | POA: Diagnosis not present

## 2017-09-28 DIAGNOSIS — I129 Hypertensive chronic kidney disease with stage 1 through stage 4 chronic kidney disease, or unspecified chronic kidney disease: Secondary | ICD-10-CM | POA: Diagnosis not present

## 2017-09-28 DIAGNOSIS — L03818 Cellulitis of other sites: Secondary | ICD-10-CM | POA: Diagnosis not present

## 2017-09-29 DIAGNOSIS — F329 Major depressive disorder, single episode, unspecified: Secondary | ICD-10-CM | POA: Diagnosis not present

## 2017-09-29 DIAGNOSIS — I129 Hypertensive chronic kidney disease with stage 1 through stage 4 chronic kidney disease, or unspecified chronic kidney disease: Secondary | ICD-10-CM | POA: Diagnosis not present

## 2017-09-29 DIAGNOSIS — N183 Chronic kidney disease, stage 3 (moderate): Secondary | ICD-10-CM | POA: Diagnosis not present

## 2017-09-29 DIAGNOSIS — L03818 Cellulitis of other sites: Secondary | ICD-10-CM | POA: Diagnosis not present

## 2017-09-29 DIAGNOSIS — D631 Anemia in chronic kidney disease: Secondary | ICD-10-CM | POA: Diagnosis not present

## 2017-09-29 DIAGNOSIS — M6281 Muscle weakness (generalized): Secondary | ICD-10-CM | POA: Diagnosis not present

## 2017-09-30 DIAGNOSIS — M6281 Muscle weakness (generalized): Secondary | ICD-10-CM | POA: Diagnosis not present

## 2017-09-30 DIAGNOSIS — N183 Chronic kidney disease, stage 3 (moderate): Secondary | ICD-10-CM | POA: Diagnosis not present

## 2017-09-30 DIAGNOSIS — D631 Anemia in chronic kidney disease: Secondary | ICD-10-CM | POA: Diagnosis not present

## 2017-09-30 DIAGNOSIS — L03818 Cellulitis of other sites: Secondary | ICD-10-CM | POA: Diagnosis not present

## 2017-09-30 DIAGNOSIS — F329 Major depressive disorder, single episode, unspecified: Secondary | ICD-10-CM | POA: Diagnosis not present

## 2017-09-30 DIAGNOSIS — I129 Hypertensive chronic kidney disease with stage 1 through stage 4 chronic kidney disease, or unspecified chronic kidney disease: Secondary | ICD-10-CM | POA: Diagnosis not present

## 2017-10-03 ENCOUNTER — Other Ambulatory Visit: Payer: Self-pay | Admitting: Oncology

## 2017-10-03 DIAGNOSIS — M6281 Muscle weakness (generalized): Secondary | ICD-10-CM | POA: Diagnosis not present

## 2017-10-03 DIAGNOSIS — L03818 Cellulitis of other sites: Secondary | ICD-10-CM | POA: Diagnosis not present

## 2017-10-03 DIAGNOSIS — D631 Anemia in chronic kidney disease: Secondary | ICD-10-CM | POA: Diagnosis not present

## 2017-10-03 DIAGNOSIS — N183 Chronic kidney disease, stage 3 (moderate): Secondary | ICD-10-CM | POA: Diagnosis not present

## 2017-10-03 DIAGNOSIS — I129 Hypertensive chronic kidney disease with stage 1 through stage 4 chronic kidney disease, or unspecified chronic kidney disease: Secondary | ICD-10-CM | POA: Diagnosis not present

## 2017-10-03 DIAGNOSIS — F329 Major depressive disorder, single episode, unspecified: Secondary | ICD-10-CM | POA: Diagnosis not present

## 2017-10-04 DIAGNOSIS — N183 Chronic kidney disease, stage 3 (moderate): Secondary | ICD-10-CM | POA: Diagnosis not present

## 2017-10-04 DIAGNOSIS — D631 Anemia in chronic kidney disease: Secondary | ICD-10-CM | POA: Diagnosis not present

## 2017-10-04 DIAGNOSIS — I129 Hypertensive chronic kidney disease with stage 1 through stage 4 chronic kidney disease, or unspecified chronic kidney disease: Secondary | ICD-10-CM | POA: Diagnosis not present

## 2017-10-04 DIAGNOSIS — F329 Major depressive disorder, single episode, unspecified: Secondary | ICD-10-CM | POA: Diagnosis not present

## 2017-10-04 DIAGNOSIS — L03818 Cellulitis of other sites: Secondary | ICD-10-CM | POA: Diagnosis not present

## 2017-10-04 DIAGNOSIS — M6281 Muscle weakness (generalized): Secondary | ICD-10-CM | POA: Diagnosis not present

## 2017-10-05 ENCOUNTER — Telehealth: Payer: Self-pay | Admitting: Oncology

## 2017-10-05 NOTE — Telephone Encounter (Signed)
Scheduled appt per 4/23 sch msg - spoke w/ pt's wife re appt that was added.

## 2017-10-06 ENCOUNTER — Inpatient Hospital Stay: Payer: Medicare Other

## 2017-10-06 ENCOUNTER — Other Ambulatory Visit: Payer: Self-pay | Admitting: Oncology

## 2017-10-06 ENCOUNTER — Inpatient Hospital Stay: Payer: Medicare Other | Attending: Oncology | Admitting: Adult Health

## 2017-10-06 ENCOUNTER — Telehealth: Payer: Self-pay | Admitting: Oncology

## 2017-10-06 VITALS — BP 122/52 | HR 69 | Temp 98.1°F | Resp 18 | Ht 68.0 in | Wt 166.1 lb

## 2017-10-06 DIAGNOSIS — D631 Anemia in chronic kidney disease: Secondary | ICD-10-CM | POA: Diagnosis not present

## 2017-10-06 DIAGNOSIS — D473 Essential (hemorrhagic) thrombocythemia: Secondary | ICD-10-CM

## 2017-10-06 DIAGNOSIS — N183 Chronic kidney disease, stage 3 unspecified: Secondary | ICD-10-CM

## 2017-10-06 DIAGNOSIS — N189 Chronic kidney disease, unspecified: Secondary | ICD-10-CM | POA: Diagnosis not present

## 2017-10-06 LAB — COMPREHENSIVE METABOLIC PANEL
ALBUMIN: 3.3 g/dL — AB (ref 3.5–5.0)
ALK PHOS: 96 U/L (ref 40–150)
ALT: 11 U/L (ref 0–55)
ANION GAP: 7 (ref 3–11)
AST: 18 U/L (ref 5–34)
BILIRUBIN TOTAL: 0.4 mg/dL (ref 0.2–1.2)
BUN: 23 mg/dL (ref 7–26)
CALCIUM: 9 mg/dL (ref 8.4–10.4)
CO2: 25 mmol/L (ref 22–29)
CREATININE: 1.49 mg/dL — AB (ref 0.70–1.30)
Chloride: 109 mmol/L (ref 98–109)
GFR calc Af Amer: 47 mL/min — ABNORMAL LOW (ref >60)
GFR calc non Af Amer: 40 mL/min — ABNORMAL LOW (ref >60)
GLUCOSE: 94 mg/dL (ref 70–140)
Potassium: 4.2 mmol/L (ref 3.5–5.1)
Sodium: 141 mmol/L (ref 136–145)
Total Protein: 7.2 g/dL (ref 6.4–8.3)

## 2017-10-06 LAB — CBC WITH DIFFERENTIAL/PLATELET
Basophils Absolute: 0.2 10*3/uL — ABNORMAL HIGH (ref 0.0–0.1)
Basophils Relative: 2 %
Eosinophils Absolute: 0.1 10*3/uL (ref 0.0–0.5)
Eosinophils Relative: 1 %
HEMATOCRIT: 28.2 % — AB (ref 38.4–49.9)
HEMOGLOBIN: 9.3 g/dL — AB (ref 13.0–17.1)
Lymphocytes Relative: 17 %
Lymphs Abs: 1.8 10*3/uL (ref 0.9–3.3)
MCH: 34.3 pg — ABNORMAL HIGH (ref 27.2–33.4)
MCHC: 33.1 g/dL (ref 32.0–36.0)
MCV: 103.8 fL — ABNORMAL HIGH (ref 79.3–98.0)
Monocytes Absolute: 1.3 10*3/uL — ABNORMAL HIGH (ref 0.1–0.9)
Monocytes Relative: 12 %
NEUTROS ABS: 7.3 10*3/uL — AB (ref 1.5–6.5)
NEUTROS PCT: 68 %
Platelets: 378 10*3/uL (ref 140–400)
RBC: 2.71 MIL/uL — AB (ref 4.20–5.82)
RDW: 21.5 % — ABNORMAL HIGH (ref 11.0–14.6)
WBC: 10.6 10*3/uL — AB (ref 4.0–10.3)

## 2017-10-06 MED ORDER — DARBEPOETIN ALFA 200 MCG/0.4ML IJ SOSY
PREFILLED_SYRINGE | INTRAMUSCULAR | Status: AC
  Filled 2017-10-06: qty 0.4

## 2017-10-06 MED ORDER — DARBEPOETIN ALFA 300 MCG/0.6ML IJ SOSY
300.0000 ug | PREFILLED_SYRINGE | Freq: Once | INTRAMUSCULAR | Status: AC
  Administered 2017-10-06: 300 ug via SUBCUTANEOUS

## 2017-10-06 NOTE — Telephone Encounter (Signed)
Gave avs and calendar ° °

## 2017-10-06 NOTE — Progress Notes (Signed)
ID: Nicholas [name]   DOB: 1927-07-02  MR#: [id number]  WSF#:[id number]  PCP: Denita Lung, MD GYN: SU:  OTHER MD:  CHIEF COMPLAINT:  Essential thrombocytosis and chronic anemia  CURRENT TREATMENT: Anagrelide, ASA; switching to hydroxyurea  INTERVAL HISTORY: Nicholas [name] returns today for follow-up and treatment of his myeloproliferative neoplasm.  He is accompanied by his daughter Nicholas [name].  He is doing well today.  He is taking Hydrea daily and appears to be tolerating it well.  He continues to have fatigue.  He also is receiving Aranesp 282mg every three weeks with good tolerance.  His hemoglobin today is 9.3.    [name] was recently hospitalized for a dog bite that got infected.  That has healed and he is off antibiotics and recovering well.      REVIEW OF SYSTEMS: [name] is feeling well other than some fatigue.  He denies fever, chills, cough.  He denies headaches, vision changes, shortness of breath, chest pain, palpitations, nausea, vomiting, constipation or diarrhea.  He does not have a skin rash.  A detailed ROS was conducted today and it is non contributory.    PAST MEDICAL HISTORY: Past Medical History:  Diagnosis Date  . Abnormal CT scan, sinus 10/04/06   pansinusitis, mildly deviated septum  . Allergy   . Anemia    related to CKD  . Chronic sinusitis   . Dysthymia    dysthymia  . H/O echocardiogram 01/04/2007   LV function normal, 55-60% EF, mildly increased thickenss of LV wall  . H/O echocardiogram 12/2006   mild LV wall thickening, EF 55-60%, LV function normal  . History of MRI of brain and brain stem 02/26/06   sinusitis, nonspecific deep white matter changes normal for age  . Hypertension   . Seborrheic keratosis    sees HToni Arthurs NP dermatology  . [place]rombocytosis [place]    sees hematology    PAST SURGICAL HISTORY: Past Surgical History:  Procedure Laterality Date  . HERNIA REPAIR     right inguinal repair  . I&D EXTREMITY Right 09/16/2017   Procedure:  IRRIGATION AND DEBRIDEMENT EXTREMITY;  Surgeon: GRoseanne Kaufman MD;  [place]  Service: Orthopedics;  Laterality: Right;  . TONSILLECTOMY     childhood  . VARICOSE VEIN SURGERY     right lower leg    FAMILY HISTORY Family History  Problem Relation Age of Onset  . Heart disease Father   . Stroke Father   . Cancer Neg Hx   . COPD Neg Hx   . Diabetes Neg Hx     SOCIAL HISTORY:  (Reviewed 11/28/2013) The patient lives with his wife, who suffers from diabetes and emphysema and has had some small strokes. She is able to care for herself. He gives her her insulin shots. He has 2 daughters, SBarbera Setterswho lives in [place] and CCopewho [place] with [employer].The patient has 3 grandchildren..[name]   ADVANCED DIRECTIVES: the patient tells me his daughter KJonothan Heberlewould make health care decisions if he is incapacitated   HEALTH MAINTENANCE: (Updated 11/28/2013) Social History   Tobacco Use  . Smoking status: Former Smoker    Last attempt to quit: 06/14/1958    Years since quitting: 59.3  . Smokeless tobacco: Never Used  Substance Use Topics  . Alcohol use: No  . Drug use: No     Colonoscopy:  Not on file  PSA: Not on file  Bone density:  Never  Lipid panel: October 2014  Allergies  Allergen Reactions  . Tape Other (See Comments)    The patient's SKIN IS VERY THIN AND TEARS AND BRUISES VERY EASILY!!  . Erythromycin Other (See Comments)    Gums turned red and bled profusely    Current Outpatient Medications  Medication Sig Dispense Refill  . acetaminophen (TYLENOL) 650 MG CR tablet Take 650 mg by mouth at bedtime.    [name] aspirin 81 MG tablet Take 1 tablet (81 mg total) by mouth daily. 90 tablet 3  . cetirizine (ZYRTEC) 10 MG tablet Take 10 mg by mouth at bedtime.    . CVS VITAMIN D3 1000 units capsule TAKE 1 TABLET (1,000 UNITS TOTAL) BY MOUTH DAILY. 90 capsule 3  . FLUoxetine (PROZAC) 10 MG tablet Take 1 tablet (10 mg total) by mouth  daily. (Patient taking differently: Take 10 mg by mouth at bedtime. ) 90 tablet 3  . FLUoxetine (PROZAC) 40 MG capsule Take 1 capsule (40 mg total) by mouth daily. (Patient taking differently: Take 40 mg by mouth at bedtime. ) 90 capsule 3  . hydroxyurea (HYDREA) 500 MG capsule Take 1 capsule (500 mg total) by mouth daily. May take with food to minimize GI side effects. 90 capsule 3  . ibuprofen (ADVIL,MOTRIN) 400 MG tablet Take 1 tablet (400 mg total) by mouth every 6 (six) hours as needed for fever, headache or mild pain. 30 tablet 0  . Multiple Vitamins-Minerals (PRESERVISION AREDS 2) CAPS Take 1 capsule by mouth 2 (two) times daily.    [name] Glyc-Propyl Glyc PF (SYSTANE PRESERVATIVE FREE) 0.4-0.3 % SOLN Place 1 drop into both eyes daily as needed (for irritation).    . saccharomyces boulardii (FLORASTOR) 250 MG capsule Take 1 capsule (250 mg total) by mouth 2 (two) times daily.    . sodium chloride (OCEAN) 0.65 % nasal spray Place 1 spray into the nose 4 (four) times daily as needed for congestion.     [name] terazosin (HYTRIN) 2 MG capsule Take 1 capsule (2 mg total) by mouth daily. (Patient taking differently: Take 2 mg by mouth at bedtime. ) 90 capsule 3  . CVS VITAMIN D3 1000 units capsule TAKE 1 TABLET (1,000 UNITS TOTAL) BY MOUTH DAILY. (Patient not taking: Reported on 09/13/2017) 90 capsule 3   No current facility-administered medications for this visit.     OBJECTIVE:  Vitals:   10/06/17 1257  BP: (!) 122/52  Pulse: 69  Resp: 18  Temp: 98.1 F (36.7 C)  SpO2: 98%     Body mass index is 25.26 kg/m.    ECOG FS: 1 Filed Weights   10/06/17 1257  Weight: 166 lb 1.6 oz (75.3 kg)  GENERAL: Patient is an older appearing male in no acute distress HEENT:  Sclerae anicteric.  Oropharynx clear and moist. No ulcerations or evidence of oropharyngeal candidiasis. Neck is supple.  NODES:  No cervical, supraclavicular, or axillary lymphadenopathy palpated.  LUNGS:  Clear to auscultation  bilaterally.  No wheezes or rhonchi. HEART:  Regular rate and rhythm. No murmur appreciated. ABDOMEN:  Soft, nontender.  Positive, normoactive bowel sounds. No organomegaly palpated. MSK:  No focal spinal tenderness to palpation. Full range of motion bilaterally in the upper extremities. EXTREMITIES:  No peripheral edema.   SKIN:  Clear with no obvious rashes or skin changes. No nail dyscrasia. NEURO:  Nonfocal. Well oriented.  Appropriate affect.     LAB RESULTS:   Lab Results  Component Value Date  WBC 10.6 (H) 10/06/2017   NEUTROABS 7.3 (H) 10/06/2017   HGB 9.3 (L) 10/06/2017   HCT 28.2 (L) 10/06/2017   MCV 103.8 (H) 10/06/2017   PLT 378 10/06/2017      Chemistry      Component Value Date/Time   NA 137 09/19/2017 0611   NA 142 06/03/2017 1404   K 4.1 09/19/2017 0611   K 3.9 06/03/2017 1404   CL 106 09/19/2017 0611   CO2 21 (L) 09/19/2017 0611   CO2 26 06/03/2017 1404   BUN 28 (H) 09/19/2017 0611   BUN 19.9 06/03/2017 1404   CREATININE 1.50 (H) 09/19/2017 0611   CREATININE 1.6 (H) 06/03/2017 1404      Component Value Date/Time   CALCIUM 8.0 (L) 09/19/2017 0611   CALCIUM 8.9 06/03/2017 1404   ALKPHOS 71 09/13/2017 2149   ALKPHOS 91 06/03/2017 1404   AST 20 09/13/2017 2149   AST 20 06/03/2017 1404   ALT 14 (L) 09/13/2017 2149   ALT 12 06/03/2017 1404   BILITOT 1.0 09/13/2017 2149   BILITOT 0.48 06/03/2017 1404       STUDIES: Bone marrow biopsy results given to the patient and discussed with him and his daughter  ASSESSMENT: 70 y.o.  [place] man with   (1)  a history of essential thrombocytosis initially diagnosed December 2003,  controlled on anagrelide 0.5 mg daily and aspirin 81 mg daily.   (a) no evidence of leukemic transformation  (2) anemia, mostly secondary to chronic kidney disease   (3) possible transition to myelofibrosis on bone marrow biopsy 06/22/2017,  (a) normal cytogenetics  (b) FISH studies pending  PLAN:  Ashraf is doing  well today.  His CBC is stable and his WBC have decreased down to 10.6.  His platelts are 378.  His hemoglobin is 9.3.  The goal hemoglobin is between 10 and 11.  I reviewed his labs with Dr. Jana Hakim.  Tirso will stay on his current dose of Hydrea, and we will increase his Aranesp to 368mg every 21 days.    Xaiver will continue to receive Aranesp with a lab prior every 21 days.  He will see Dr. MJana Hakimon 12/06/17 for labs, f/u and injection.    Per Dr. MClaudette Lawshe is in a stable Hydrea dose and a stable Aranesp dose we can revert to visits here once a year with labs as needed for his EPO treatments."  Natanel and his family know to call for any questions or concerns prior to his next visit with [place]    A total of (20) minutes of face-to-face time was spent with this patient with greater than 50% of that time in counseling and care-coordination.    LWilber Bihari NP 10/06/17 1:05 PM Medical Oncology [facility] H[facility]AD[place] [address]Tel. [phone]   Fax. [phone]

## 2017-10-07 ENCOUNTER — Encounter: Payer: Self-pay | Admitting: Adult Health

## 2017-10-07 DIAGNOSIS — N183 Chronic kidney disease, stage 3 (moderate): Secondary | ICD-10-CM | POA: Diagnosis not present

## 2017-10-07 DIAGNOSIS — F329 Major depressive disorder, single episode, unspecified: Secondary | ICD-10-CM | POA: Diagnosis not present

## 2017-10-07 DIAGNOSIS — L03818 Cellulitis of other sites: Secondary | ICD-10-CM | POA: Diagnosis not present

## 2017-10-07 DIAGNOSIS — D631 Anemia in chronic kidney disease: Secondary | ICD-10-CM | POA: Diagnosis not present

## 2017-10-07 DIAGNOSIS — I129 Hypertensive chronic kidney disease with stage 1 through stage 4 chronic kidney disease, or unspecified chronic kidney disease: Secondary | ICD-10-CM | POA: Diagnosis not present

## 2017-10-07 DIAGNOSIS — M6281 Muscle weakness (generalized): Secondary | ICD-10-CM | POA: Diagnosis not present

## 2017-10-10 DIAGNOSIS — H353221 Exudative age-related macular degeneration, left eye, with active choroidal neovascularization: Secondary | ICD-10-CM | POA: Diagnosis not present

## 2017-10-12 DIAGNOSIS — D631 Anemia in chronic kidney disease: Secondary | ICD-10-CM | POA: Diagnosis not present

## 2017-10-12 DIAGNOSIS — M6281 Muscle weakness (generalized): Secondary | ICD-10-CM | POA: Diagnosis not present

## 2017-10-12 DIAGNOSIS — N183 Chronic kidney disease, stage 3 (moderate): Secondary | ICD-10-CM | POA: Diagnosis not present

## 2017-10-12 DIAGNOSIS — I129 Hypertensive chronic kidney disease with stage 1 through stage 4 chronic kidney disease, or unspecified chronic kidney disease: Secondary | ICD-10-CM | POA: Diagnosis not present

## 2017-10-12 DIAGNOSIS — L03818 Cellulitis of other sites: Secondary | ICD-10-CM | POA: Diagnosis not present

## 2017-10-12 DIAGNOSIS — F329 Major depressive disorder, single episode, unspecified: Secondary | ICD-10-CM | POA: Diagnosis not present

## 2017-10-25 ENCOUNTER — Inpatient Hospital Stay: Payer: Medicare Other | Attending: Oncology

## 2017-10-25 ENCOUNTER — Inpatient Hospital Stay: Payer: Medicare Other

## 2017-10-25 MED ORDER — DARBEPOETIN ALFA 300 MCG/0.6ML IJ SOSY
PREFILLED_SYRINGE | INTRAMUSCULAR | Status: AC
Start: 2017-10-25 — End: 2017-10-25
  Filled 2017-10-25: qty 0.6

## 2017-11-10 ENCOUNTER — Telehealth: Payer: Self-pay | Admitting: Oncology

## 2017-11-10 NOTE — Telephone Encounter (Signed)
Spoke w/ pt.  GM out of office on 6/20, resch lab& dr appt to 6/25 w/ LC.  Sent calendar to pt.

## 2017-11-15 ENCOUNTER — Inpatient Hospital Stay: Payer: Medicare Other

## 2017-11-15 ENCOUNTER — Inpatient Hospital Stay: Payer: Medicare Other | Attending: Oncology

## 2017-11-15 VITALS — BP 134/57 | HR 75 | Temp 98.3°F | Resp 20

## 2017-11-15 DIAGNOSIS — D631 Anemia in chronic kidney disease: Secondary | ICD-10-CM | POA: Diagnosis not present

## 2017-11-15 DIAGNOSIS — N183 Chronic kidney disease, stage 3 unspecified: Secondary | ICD-10-CM

## 2017-11-15 DIAGNOSIS — N189 Chronic kidney disease, unspecified: Secondary | ICD-10-CM | POA: Insufficient documentation

## 2017-11-15 DIAGNOSIS — R161 Splenomegaly, not elsewhere classified: Secondary | ICD-10-CM

## 2017-11-15 DIAGNOSIS — D473 Essential (hemorrhagic) thrombocythemia: Secondary | ICD-10-CM

## 2017-11-15 LAB — CBC WITH DIFFERENTIAL/PLATELET
BASOS PCT: 1 %
Basophils Absolute: 0.2 10*3/uL — ABNORMAL HIGH (ref 0.0–0.1)
EOS PCT: 2 %
Eosinophils Absolute: 0.2 10*3/uL (ref 0.0–0.5)
HEMATOCRIT: 28.3 % — AB (ref 38.4–49.9)
Hemoglobin: 9.4 g/dL — ABNORMAL LOW (ref 13.0–17.1)
Lymphocytes Relative: 17 %
Lymphs Abs: 1.9 10*3/uL (ref 0.9–3.3)
MCH: 35.9 pg — ABNORMAL HIGH (ref 27.2–33.4)
MCHC: 33.2 g/dL (ref 32.0–36.0)
MCV: 107.9 fL — AB (ref 79.3–98.0)
MONO ABS: 1.3 10*3/uL — AB (ref 0.1–0.9)
MONOS PCT: 11 %
NEUTROS ABS: 8 10*3/uL — AB (ref 1.5–6.5)
Neutrophils Relative %: 69 %
PLATELETS: 337 10*3/uL (ref 140–400)
RBC: 2.62 MIL/uL — ABNORMAL LOW (ref 4.20–5.82)
RDW: 19.1 % — AB (ref 11.0–14.6)
WBC: 11.7 10*3/uL — ABNORMAL HIGH (ref 4.0–10.3)

## 2017-11-15 LAB — COMPREHENSIVE METABOLIC PANEL
ALT: 10 U/L (ref 0–55)
ANION GAP: 8 (ref 3–11)
AST: 20 U/L (ref 5–34)
Albumin: 3.7 g/dL (ref 3.5–5.0)
Alkaline Phosphatase: 78 U/L (ref 40–150)
BILIRUBIN TOTAL: 0.4 mg/dL (ref 0.2–1.2)
BUN: 26 mg/dL (ref 7–26)
CHLORIDE: 108 mmol/L (ref 98–109)
CO2: 24 mmol/L (ref 22–29)
Calcium: 8.9 mg/dL (ref 8.4–10.4)
Creatinine, Ser: 1.62 mg/dL — ABNORMAL HIGH (ref 0.70–1.30)
GFR calc Af Amer: 42 mL/min — ABNORMAL LOW (ref >60)
GFR, EST NON AFRICAN AMERICAN: 37 mL/min — AB (ref >60)
Glucose, Bld: 110 mg/dL (ref 70–140)
POTASSIUM: 4.1 mmol/L (ref 3.5–5.1)
Sodium: 140 mmol/L (ref 136–145)
TOTAL PROTEIN: 7.2 g/dL (ref 6.4–8.3)

## 2017-11-15 LAB — FERRITIN: Ferritin: 262 ng/mL (ref 22–316)

## 2017-11-15 MED ORDER — DARBEPOETIN ALFA 200 MCG/0.4ML IJ SOSY
300.0000 ug | PREFILLED_SYRINGE | Freq: Once | INTRAMUSCULAR | Status: AC
  Administered 2017-11-15: 300 ug via SUBCUTANEOUS

## 2017-11-15 MED ORDER — DARBEPOETIN ALFA 300 MCG/0.6ML IJ SOSY
PREFILLED_SYRINGE | INTRAMUSCULAR | Status: AC
  Filled 2017-11-15: qty 0.6

## 2017-11-15 NOTE — Patient Instructions (Signed)

## 2017-11-29 ENCOUNTER — Ambulatory Visit
Admission: RE | Admit: 2017-11-29 | Discharge: 2017-11-29 | Disposition: A | Discharge status: Home / Self Care | Payer: Medicare Other | Source: Ambulatory Visit | Attending: Medical | Admitting: Medical

## 2017-11-29 ENCOUNTER — Ambulatory Visit (INDEPENDENT_AMBULATORY_CARE_PROVIDER_SITE_OTHER): Payer: Medicare Other | Admitting: Medical

## 2017-11-29 VITALS — BP 120/60 | HR 82 | Temp 98.1°F | Resp 16 | Ht 73.0 in | Wt 172.4 lb

## 2017-11-29 DIAGNOSIS — R234 Changes in skin texture: Secondary | ICD-10-CM | POA: Diagnosis not present

## 2017-11-29 DIAGNOSIS — W19XXXA Unspecified fall, initial encounter: Secondary | ICD-10-CM | POA: Diagnosis not present

## 2017-11-29 DIAGNOSIS — S60511A Abrasion of right hand, initial encounter: Secondary | ICD-10-CM | POA: Diagnosis not present

## 2017-11-29 DIAGNOSIS — S6992XA Unspecified injury of left wrist, hand and finger(s), initial encounter: Secondary | ICD-10-CM | POA: Diagnosis not present

## 2017-11-29 DIAGNOSIS — D631 Anemia in chronic kidney disease: Secondary | ICD-10-CM

## 2017-11-29 DIAGNOSIS — D692 Other nonthrombocytopenic purpura: Secondary | ICD-10-CM

## 2017-11-29 DIAGNOSIS — S299XXA Unspecified injury of thorax, initial encounter: Secondary | ICD-10-CM | POA: Diagnosis not present

## 2017-11-29 DIAGNOSIS — M79642 Pain in left hand: Secondary | ICD-10-CM

## 2017-11-29 DIAGNOSIS — S60512A Abrasion of left hand, initial encounter: Secondary | ICD-10-CM

## 2017-11-29 DIAGNOSIS — R05 Cough: Secondary | ICD-10-CM | POA: Insufficient documentation

## 2017-11-29 DIAGNOSIS — M7989 Other specified soft tissue disorders: Secondary | ICD-10-CM | POA: Diagnosis not present

## 2017-11-29 DIAGNOSIS — I10 Essential (primary) hypertension: Secondary | ICD-10-CM

## 2017-11-29 DIAGNOSIS — S51012A Laceration without foreign body of left elbow, initial encounter: Secondary | ICD-10-CM | POA: Diagnosis not present

## 2017-11-29 DIAGNOSIS — N183 Chronic kidney disease, stage 3 unspecified: Secondary | ICD-10-CM

## 2017-11-29 DIAGNOSIS — K047 Periapical abscess without sinus: Secondary | ICD-10-CM

## 2017-11-29 DIAGNOSIS — R059 Cough, unspecified: Secondary | ICD-10-CM | POA: Insufficient documentation

## 2017-11-29 DIAGNOSIS — D473 Essential (hemorrhagic) thrombocythemia: Secondary | ICD-10-CM

## 2017-11-29 DIAGNOSIS — R296 Repeated falls: Secondary | ICD-10-CM

## 2017-11-29 DIAGNOSIS — S20212A Contusion of left front wall of thorax, initial encounter: Secondary | ICD-10-CM

## 2017-11-29 DIAGNOSIS — R531 Weakness: Secondary | ICD-10-CM

## 2017-11-29 MED ORDER — AMOXICILLIN-POT CLAVULANATE 875-125 MG PO TABS: 1.0000 | ORAL_TABLET | Freq: Two times a day (BID) | ORAL | 0 refills | Status: DC

## 2017-11-29 NOTE — Progress Notes (Signed)
Subjective: Chief Complaint  Patient presents with  . left arm injury    left arm injury X saturday,   cough congestions, mucas coughing up white and green   He is brought in today by his daughters, 1 of which is [name] that works here.  He recently has had several falls, has been feeling generally weak.  He actually fell 3 days ago when he was trying to push his push more out of the way.  He fell on his left side he cut his hand and arm.  He was brought in currently because they cannot get his arm to quit bleeding where the thin skin tore.  He has had other recent falls as well in the house.  He felt like he lost his balance 2 of the times.  1 of the time he was leaning down to pick up something and got somewhat dizzy and fell.  He denies chest pain, syncope, loss of consciousness, paresthesias, blurred vision.  He notes he is eating a variety of foods.  He had been hospitalized in the recent past for dog bite and had ultimate surgery due to infection of the hand.  After surgery he was having PT and occupational therapy come out to the house.  Daughters have cleared out tripping hazards from the house to help.  He has a cane that he is supposed to use but does not always use.  He sees hematology for history of thrombocytosis but also recently was put on medication to help with anemia and in light of kidney disease.  No recent cardiology or nephrology consult.  He has tooth pain, supposed to see dentist tomorrow for abscess.  Past Medical History:  Diagnosis Date  . Abnormal CT scan, sinus 10/04/06   pansinusitis, mildly deviated septum  . Allergy   . Anemia    related to CKD  . Chronic sinusitis   . Dysthymia    dysthymia  . H/O echocardiogram 01/04/2007   LV function normal, 55-60% EF, mildly increased thickenss of LV wall  . H/O echocardiogram 12/2006   mild LV wall thickening, EF 55-60%, LV function normal  . History of MRI of brain and brain stem 02/26/06   sinusitis, nonspecific  deep white matter changes normal for age  . Hypertension   . Seborrheic keratosis    sees Toni Arthurs, NP dermatology  . Thrombocytosis [facility])    sees hematology   Current Outpatient Medications on File Prior to Visit  Medication Sig Dispense Refill  . acetaminophen (TYLENOL) 650 MG CR tablet Take 650 mg by mouth at bedtime.    [name] aspirin 81 MG tablet Take 1 tablet (81 mg total) by mouth daily. 90 tablet 3  . cetirizine (ZYRTEC) 10 MG tablet Take 10 mg by mouth at bedtime.    . CVS VITAMIN D3 1000 units capsule TAKE 1 TABLET (1,000 UNITS TOTAL) BY MOUTH DAILY. 90 capsule 3  . FLUoxetine (PROZAC) 40 MG capsule Take 1 capsule (40 mg total) by mouth daily. (Patient taking differently: Take 40 mg by mouth at bedtime. ) 90 capsule 3  . hydroxyurea (HYDREA) 500 MG capsule Take 1 capsule (500 mg total) by mouth daily. May take with food to minimize GI side effects. 90 capsule 3  . ibuprofen (ADVIL,MOTRIN) 400 MG tablet Take 1 tablet (400 mg total) by mouth every 6 (six) hours as needed for fever, headache or mild pain. 30 tablet 0  . Multiple Vitamins-Minerals (PRESERVISION AREDS 2) CAPS Take 1 capsule by  mouth 2 (two) times daily.    [name] Glyc-Propyl Glyc PF (SYSTANE PRESERVATIVE FREE) 0.4-0.3 % SOLN Place 1 drop into both eyes daily as needed (for irritation).    . saccharomyces boulardii (FLORASTOR) 250 MG capsule Take 1 capsule (250 mg total) by mouth 2 (two) times daily.    . sodium chloride (OCEAN) 0.65 % nasal spray Place 1 spray into the nose 4 (four) times daily as needed for congestion.     [name] terazosin (HYTRIN) 2 MG capsule Take 1 capsule (2 mg total) by mouth daily. (Patient taking differently: Take 2 mg by mouth at bedtime. ) 90 capsule 3   No current facility-administered medications on file prior to visit.    ROS as in subjective  Objective: BP 120/60   Pulse 82   Temp 98.1 F (36.7 C) (Oral)   Resp 16   Ht 6\' 1"  (1.854 m)   Wt 172 lb 6.4 oz (78.2 kg)   SpO2 97%   BMI  22.75 kg/m   Gen: wd, wn, nad Skin: Left posterior upper arm distally with approximately 3 cm x 4 cm tear of the skin avulsion type tear, bloody seepage, left dorsal hand with approximately 4 cm skin tear avulsion type wound, some mild bloody seepage, right thumb medially with small 1 cm abrasion, there is scarring of the right dorsal hand from recent dog bite and trauma surgery Left fourth and fifth finger with swelling over the MCPs, tender over same area, mild pain with range of motion which seems to be relatively full, there is bony arthritic changes of both hands throughout, wrist and forearm is nontender, Left chest wall laterally along the lower ribs with 8 cm diameter area of purplish bruising and slight swelling in the same area, tender in the same area Lungs with somewhat coarse sounds but relatively good Inspiration and expiration Heart RRR, normal S1-S2 no murmurs No swelling Neuro: Alert and oriented x3, seems to be answering questions appropriately Mid upper back with 2 cm raised cystic lesion suggestive of sebaceous cyst Purplish coloration of both forearms suggestive of senile purpura    Adult ECG Report  Indication: weakness, HTN  Rate: 72 bpm  Rhythm: normal sinus rhythm  QRS Axis: -17 degrees  PR Interval: 135ms  QRS Duration: 26ms  QTc: 432ms  Conduction Disturbances: none  Other Abnormalities: none  Patient's cardiac risk factors are: advanced age (older than 44 for men, 45 for women) and hypertension.  EKG comparison: 09/2016  Narrative Interpretation: axis change, but no other acute change      Assessment: Encounter Diagnoses  Name Primary?  . Left hand pain Yes  . Bruised rib, left, initial encounter   . Fall, initial encounter   . Thin skin   . Skin tear of left elbow without complication, initial encounter   . Abrasion of left hand, initial encounter   . Abrasion of right hand, initial encounter   . Multiple falls   . Senile purpura ([place])   .  Essential hypertension   . CKD (chronic kidney disease) stage 3, GFR 30-59 ml/min (HCC)   . Essential thrombocytosis ([place])   . Anemia in stage 3 chronic kidney disease ([place])   . Dental abscess   . Cough   . Weakness      Plan: We discussed his recent falls, possible reasons for the falls.  He reports general weakness.  This could be related to his anemia.  He just recently started on medication by injection through  hematology to stimulate blood production.  We discussed other reasons to possibly feel weak.  I reviewed recent labs in the chart.  Pending x-rays we may consider cardiology and nephrology consults.  Discussed avoiding tripping hazards, discussed using cane when needed, discussed listing to his daughter's and helping to work with his concerns so he does not do anything unnecessary such as pushing a heavy lawn more.  Hypertension-continue current medicine for now  Thrombocytosis, anemia- continue follow-up with hematology, consider nephrology consult  Regarding the left upper arm posterior tear, we placed a nonstick gauze and wrapped with Coban.   Discussed wound care, advised 1-2 times daily dressing changes, discussed hygiene.  Abrasion and bruising and swelling and pain of the left hand-send for x-ray.  We cleaned the wound and put on a new dressing with nonstick bandage and Coban today.   Rib bruising and recent fall-send for chest and rib x-ray  Dental abscess, begin Augmentin, go see dentist tomorrow as planned  Kendall was seen today for left arm injury.  Diagnoses and all orders for this visit:  Left hand pain -     DG Hand Complete Left; Future  Bruised rib, left, initial encounter -     DG Ribs Unilateral Left; Future -     DG Chest 2 View; Future  Fall, initial encounter -     DG Hand Complete Left; Future -     DG Ribs Unilateral Left; Future -     DG Chest 2 View; Future  Thin skin  Skin tear of left elbow without complication, initial  encounter  Abrasion of left hand, initial encounter  Abrasion of right hand, initial encounter  Multiple falls -     EKG 12-Lead  Senile purpura (HCC)  Essential hypertension -     EKG 12-Lead  CKD (chronic kidney disease) stage 3, GFR 30-59 ml/min (HCC) -     EKG 12-Lead  Essential thrombocytosis (HCC)  Anemia in stage 3 chronic kidney disease (HCC)  Dental abscess  Cough  Weakness  Other orders -     amoxicillin-clavulanate (AUGMENTIN) 875-125 MG tablet; Take 1 tablet by mouth 2 (two) times daily.   Spent > 45 minutes face to face with patient in discussion of symptoms, evaluation, plan and recommendations.

## 2017-11-30 ENCOUNTER — Other Ambulatory Visit: Payer: Self-pay | Admitting: Medical

## 2017-11-30 DIAGNOSIS — Z9181 History of falling: Secondary | ICD-10-CM

## 2017-11-30 DIAGNOSIS — I10 Essential (primary) hypertension: Secondary | ICD-10-CM

## 2017-11-30 DIAGNOSIS — R234 Changes in skin texture: Secondary | ICD-10-CM | POA: Insufficient documentation

## 2017-11-30 DIAGNOSIS — R531 Weakness: Secondary | ICD-10-CM

## 2017-11-30 DIAGNOSIS — S20212A Contusion of left front wall of thorax, initial encounter: Secondary | ICD-10-CM | POA: Insufficient documentation

## 2017-11-30 DIAGNOSIS — M79642 Pain in left hand: Secondary | ICD-10-CM | POA: Insufficient documentation

## 2017-11-30 DIAGNOSIS — S60511A Abrasion of right hand, initial encounter: Secondary | ICD-10-CM | POA: Insufficient documentation

## 2017-11-30 DIAGNOSIS — N183 Chronic kidney disease, stage 3 unspecified: Secondary | ICD-10-CM

## 2017-11-30 DIAGNOSIS — R296 Repeated falls: Secondary | ICD-10-CM | POA: Insufficient documentation

## 2017-11-30 NOTE — Telephone Encounter (Signed)
Is this ok to refill?  

## 2017-12-01 ENCOUNTER — Other Ambulatory Visit: Payer: Medicare Other

## 2017-12-01 ENCOUNTER — Ambulatory Visit: Payer: Medicare Other | Admitting: Oncology

## 2017-12-06 ENCOUNTER — Inpatient Hospital Stay: Payer: Medicare Other

## 2017-12-06 ENCOUNTER — Inpatient Hospital Stay: Payer: Medicare Other | Admitting: Adult Health

## 2017-12-07 NOTE — Progress Notes (Signed)
This encounter was created in error - please disregard.

## 2017-12-12 DIAGNOSIS — H353221 Exudative age-related macular degeneration, left eye, with active choroidal neovascularization: Secondary | ICD-10-CM | POA: Diagnosis not present

## 2017-12-19 DIAGNOSIS — H35722 Serous detachment of retinal pigment epithelium, left eye: Secondary | ICD-10-CM | POA: Diagnosis not present

## 2017-12-19 DIAGNOSIS — H2513 Age-related nuclear cataract, bilateral: Secondary | ICD-10-CM | POA: Diagnosis not present

## 2017-12-19 DIAGNOSIS — H353111 Nonexudative age-related macular degeneration, right eye, early dry stage: Secondary | ICD-10-CM | POA: Diagnosis not present

## 2017-12-19 DIAGNOSIS — H35363 Drusen (degenerative) of macula, bilateral: Secondary | ICD-10-CM | POA: Diagnosis not present

## 2017-12-19 DIAGNOSIS — H353221 Exudative age-related macular degeneration, left eye, with active choroidal neovascularization: Secondary | ICD-10-CM | POA: Diagnosis not present

## 2018-01-17 ENCOUNTER — Inpatient Hospital Stay: Payer: Medicare Other | Attending: Oncology

## 2018-01-17 ENCOUNTER — Inpatient Hospital Stay (HOSPITAL_BASED_OUTPATIENT_CLINIC_OR_DEPARTMENT_OTHER): Payer: Medicare Other | Admitting: Adult Health

## 2018-01-17 ENCOUNTER — Telehealth: Payer: Self-pay | Admitting: Adult Health

## 2018-01-17 ENCOUNTER — Encounter: Payer: Self-pay | Admitting: Adult Health

## 2018-01-17 ENCOUNTER — Inpatient Hospital Stay: Payer: Medicare Other

## 2018-01-17 VITALS — BP 129/56 | HR 71 | Temp 98.1°F | Resp 18 | Ht 73.0 in | Wt 172.8 lb

## 2018-01-17 DIAGNOSIS — Z87891 Personal history of nicotine dependence: Secondary | ICD-10-CM | POA: Diagnosis not present

## 2018-01-17 DIAGNOSIS — D631 Anemia in chronic kidney disease: Secondary | ICD-10-CM | POA: Diagnosis not present

## 2018-01-17 DIAGNOSIS — N189 Chronic kidney disease, unspecified: Secondary | ICD-10-CM

## 2018-01-17 DIAGNOSIS — N183 Chronic kidney disease, stage 3 unspecified: Secondary | ICD-10-CM

## 2018-01-17 DIAGNOSIS — R5383 Other fatigue: Secondary | ICD-10-CM | POA: Insufficient documentation

## 2018-01-17 DIAGNOSIS — R296 Repeated falls: Secondary | ICD-10-CM

## 2018-01-17 DIAGNOSIS — D473 Essential (hemorrhagic) thrombocythemia: Secondary | ICD-10-CM

## 2018-01-17 DIAGNOSIS — R161 Splenomegaly, not elsewhere classified: Secondary | ICD-10-CM

## 2018-01-17 LAB — CBC WITH DIFFERENTIAL/PLATELET
BASOS PCT: 2 %
Basophils Absolute: 0.2 10*3/uL — ABNORMAL HIGH (ref 0.0–0.1)
EOS ABS: 0.1 10*3/uL (ref 0.0–0.5)
EOS PCT: 1 %
HCT: 29.2 % — ABNORMAL LOW (ref 38.4–49.9)
Hemoglobin: 9.8 g/dL — ABNORMAL LOW (ref 13.0–17.1)
Lymphocytes Relative: 18 %
Lymphs Abs: 2 10*3/uL (ref 0.9–3.3)
MCH: 36.6 pg — ABNORMAL HIGH (ref 27.2–33.4)
MCHC: 33.7 g/dL (ref 32.0–36.0)
MCV: 108.5 fL — ABNORMAL HIGH (ref 79.3–98.0)
MONO ABS: 1.2 10*3/uL — AB (ref 0.1–0.9)
Monocytes Relative: 10 %
NEUTROS ABS: 7.7 10*3/uL — AB (ref 1.5–6.5)
Neutrophils Relative %: 69 %
PLATELETS: 353 10*3/uL (ref 140–400)
RBC: 2.69 MIL/uL — ABNORMAL LOW (ref 4.20–5.82)
RDW: 18.6 % — AB (ref 11.0–14.6)
WBC: 11.1 10*3/uL — AB (ref 4.0–10.3)

## 2018-01-17 LAB — COMPREHENSIVE METABOLIC PANEL
ALK PHOS: 74 U/L (ref 38–126)
ALT: 13 U/L (ref 0–44)
AST: 18 U/L (ref 15–41)
Albumin: 3.7 g/dL (ref 3.5–5.0)
Anion gap: 10 (ref 5–15)
BILIRUBIN TOTAL: 0.4 mg/dL (ref 0.3–1.2)
BUN: 26 mg/dL — AB (ref 8–23)
CALCIUM: 8.9 mg/dL (ref 8.9–10.3)
CO2: 23 mmol/L (ref 22–32)
CREATININE: 1.72 mg/dL — AB (ref 0.61–1.24)
Chloride: 109 mmol/L (ref 98–111)
GFR calc Af Amer: 39 mL/min — ABNORMAL LOW (ref >60)
GFR, EST NON AFRICAN AMERICAN: 34 mL/min — AB (ref >60)
Glucose, Bld: 93 mg/dL (ref 70–99)
Potassium: 4.3 mmol/L (ref 3.5–5.1)
Sodium: 142 mmol/L (ref 135–145)
TOTAL PROTEIN: 7.1 g/dL (ref 6.5–8.1)

## 2018-01-17 LAB — FERRITIN: FERRITIN: 230 ng/mL (ref 24–336)

## 2018-01-17 MED ORDER — DARBEPOETIN ALFA 300 MCG/0.6ML IJ SOSY
300.0000 ug | PREFILLED_SYRINGE | Freq: Once | INTRAMUSCULAR | Status: AC
  Administered 2018-01-17: 300 ug via SUBCUTANEOUS

## 2018-01-17 NOTE — Progress Notes (Signed)
ID: Nicholas [name]   DOB: 24-Jul-1927  MR#: [id number]  HCW#:[id number]  PCP: Denita Lung, MD GYN: SU:  OTHER MD:  CHIEF COMPLAINT:  Essential thrombocytosis and chronic anemia  CURRENT TREATMENT: Anagrelide, ASA; switching to hydroxyurea  INTERVAL HISTORY: Mr. Nicholas [name] returns today for follow-up and treatment of his myeloproliferative neoplasm.  He is accompanied by his daughter [name].  He is doing well today.  He is taking Hydrea daily and appears to be tolerating it well.  He continues to have fatigue.  He also is receiving Aranesp 341mg every three weeks with good tolerance.     REVIEW OF SYSTEMS: Nicholas [name] is doing moderately well today.  He reports being tired.  He continues to tolerate his meds well.  His daughter notes that he has experienced two falls since his last appointment with [place]  He was evaluated by his pcp following one of those falls and he was referred to cardiology.  Nicholas [name] denies any other issues today, and detailed ROS was conducted and was otherwise non contributory.    PAST MEDICAL HISTORY: Past Medical History:  Diagnosis Date  . Abnormal CT scan, sinus 10/04/06   pansinusitis, mildly deviated septum  . Allergy   . Anemia    related to CKD  . Chronic sinusitis   . Dysthymia    dysthymia  . H/O echocardiogram 01/04/2007   LV function normal, 55-60% EF, mildly increased thickenss of LV wall  . H/O echocardiogram 12/2006   mild LV wall thickening, EF 55-60%, LV function normal  . History of MRI of brain and brain stem 02/26/06   sinusitis, nonspecific deep white matter changes normal for age  . Hypertension   . Seborrheic keratosis    sees HToni Arthurs NP dermatology  . Thrombocytosis ([place]    sees hematology    PAST SURGICAL HISTORY: Past Surgical History:  Procedure Laterality Date  . HERNIA REPAIR     right inguinal repair  . I&D EXTREMITY Right 09/16/2017   Procedure: IRRIGATION AND DEBRIDEMENT EXTREMITY;  Surgeon: GRoseanne Kaufman MD;  [facility]  Service: Orthopedics;  Laterality: Right;  . TONSILLECTOMY     childhood  . VARICOSE VEIN SURGERY     right lower leg    FAMILY HISTORY Family History  Problem Relation Age of Onset  . Heart disease Father   . Stroke Father   . Cancer Neg Hx   . COPD Neg Hx   . Diabetes Neg Hx     SOCIAL HISTORY:  (Reviewed 11/28/2013) The patient lives with his wife, who suffers from diabetes and emphysema and has had some small strokes. She is able to care for herself. He gives her her insulin shots. He has 2 daughters, SBarbera Set[place]who lives in [place] and [place]own[place]who lives near the [place] works with [employer].The patient has 3 grandchildren..Nicholas [name]  ADVANCED DIRECTIVES: the patient tells me his daughter Nicholas Sangalangwould make health care decisions if he is incapacitated   HEALTH MAINTENANCE: (Updated 11/28/2013) Social History   Tobacco Use  . Smoking status: Former Smoker    Last attempt to quit: 06/14/1958    Years since quitting: 59.6  . Smokeless tobacco: Never Used  Substance Use Topics  . Alcohol use: No  . Drug use: No     Colonoscopy:  Not on file  PSA: Not on file  Bone density: Never  Lipid panel: October 2014  Allergies  Allergen Reactions  . Tape Other (  See Comments)    The patient's SKIN IS VERY THIN AND TEARS AND BRUISES VERY EASILY!!  . Erythromycin Other (See Comments)    Gums turned red and bled profusely    Current Outpatient Medications  Medication Sig Dispense Refill  . acetaminophen (TYLENOL) 650 MG CR tablet Take 650 mg by mouth at bedtime.    Nicholas [name] aspirin 81 MG tablet Take 1 tablet (81 mg total) by mouth daily. 90 tablet 3  . cetirizine (ZYRTEC) 10 MG tablet Take 10 mg by mouth at bedtime.    . CVS VITAMIN D3 1000 units capsule TAKE 1 TABLET (1,000 UNITS TOTAL) BY MOUTH DAILY. 90 capsule 3  . FLUoxetine (PROZAC) 40 MG capsule Take 1 capsule (40 mg total) by mouth daily. (Patient taking differently: Take 40 mg by mouth at bedtime. ) 90  capsule 3  . hydroxyurea (HYDREA) 500 MG capsule Take 1 capsule (500 mg total) by mouth daily. May take with food to minimize GI side effects. 90 capsule 3  . ibuprofen (ADVIL,MOTRIN) 400 MG tablet Take 1 tablet (400 mg total) by mouth every 6 (six) hours as needed for fever, headache or mild pain. 30 tablet 0  . Multiple Vitamins-Minerals (PRESERVISION AREDS 2) CAPS Take 1 capsule by mouth 2 (two) times daily.    [name] Glyc-Propyl Glyc PF (SYSTANE PRESERVATIVE FREE) 0.4-0.3 % SOLN Place 1 drop into both eyes daily as needed (for irritation).    . saccharomyces boulardii (FLORASTOR) 250 MG capsule Take 1 capsule (250 mg total) by mouth 2 (two) times daily.    . sodium chloride (OCEAN) 0.65 % nasal spray Place 1 spray into the nose 4 (four) times daily as needed for congestion.     Nicholas [name] terazosin (HYTRIN) 2 MG capsule Take 1 capsule (2 mg total) by mouth at bedtime. 90 capsule 0   No current facility-administered medications for this visit.     OBJECTIVE:  Vitals:   01/17/18 1449  BP: (!) 129/56  Pulse: 71  Resp: 18  Temp: 98.1 F (36.7 C)  SpO2: 98%     Body mass index is 22.8 kg/m.    ECOG FS: 1 Filed Weights   01/17/18 1449  Weight: 172 lb 12.8 oz (78.4 kg)  GENERAL: Patient is an older and tired appearing male in no acute distress HEENT:  Sclerae anicteric.  Oropharynx clear and moist. No ulcerations or evidence of oropharyngeal candidiasis. Neck is supple.  NODES:  No cervical, supraclavicular, or axillary lymphadenopathy palpated.  LUNGS:  Clear to auscultation bilaterally.  No wheezes or rhonchi. HEART:  Regular rate and rhythm. No murmur appreciated. ABDOMEN:  Soft, nontender.  Positive, normoactive bowel sounds. No organomegaly palpated. MSK:  No focal spinal tenderness to palpation. Full range of motion bilaterally in the upper extremities. EXTREMITIES:  No peripheral edema.   SKIN:  Clear with no obvious rashes or skin changes. No nail dyscrasia. NEURO:  Nonfocal.  Well oriented.  Appropriate affect.    LAB RESULTS:   Lab Results  Component Value Date   WBC 11.1 (H) 01/17/2018   NEUTROABS 7.7 (H) 01/17/2018   HGB 9.8 (L) 01/17/2018   HCT 29.2 (L) 01/17/2018   MCV 108.5 (H) 01/17/2018   PLT 353 01/17/2018      Chemistry      Component Value Date/Time   NA 142 01/17/2018 1359   NA 142 06/03/2017 1404   K 4.3 01/17/2018 1359   K 3.9 06/03/2017 1404   CL 109 01/17/2018 1359  CO2 23 01/17/2018 1359   CO2 26 06/03/2017 1404   BUN 26 (H) 01/17/2018 1359   BUN 19.9 06/03/2017 1404   CREATININE 1.72 (H) 01/17/2018 1359   CREATININE 1.6 (H) 06/03/2017 1404      Component Value Date/Time   CALCIUM 8.9 01/17/2018 1359   CALCIUM 8.9 06/03/2017 1404   ALKPHOS 74 01/17/2018 1359   ALKPHOS 91 06/03/2017 1404   AST 18 01/17/2018 1359   AST 20 06/03/2017 1404   ALT 13 01/17/2018 1359   ALT 12 06/03/2017 1404   BILITOT 0.4 01/17/2018 1359   BILITOT 0.48 06/03/2017 1404       STUDIES: Bone marrow biopsy results given to the patient and discussed with him and his daughter  ASSESSMENT: 47 y.o.  [place] man with   (1)  a history of essential thrombocytosis initially diagnosed December 2003,  controlled on anagrelide 0.5 mg daily and aspirin 81 mg daily.   (a) no evidence of leukemic transformation  (2) anemia, mostly secondary to chronic kidney disease, receiving aranesp 320mg every 3 weeks  (3) possible transition to myelofibrosis on bone marrow biopsy 06/22/2017,  (a) normal cytogenetics  (b) FISH studies pending  PLAN:  Ulys is doing well today.  His labs remain stable.  I reviewed them with Dr. MJana Hakimin detail, who would like for him to continue his current treatment plan.  He will proceed with aranesp today.  His hemoglobin is 9.8.  He will also continue on Hydroxyurea 5052mdaily.  He is tolerating this well.  He will continue to return every 3 weeks for labs and injections.  I will see him in 9 weeks.    Quinnten and  his family know to call for any questions or concerns prior to his next visit with [place]   A total of (20) minutes of face-to-face time was spent with this patient with greater than 50% of that time in counseling and care-coordination.    LiWilber BihariNP 01/17/18 2:51 PM Medical Oncology and Hematology CoL[facility] RoadvBlairNC [address]el. [phone]  Fax. [phone]

## 2018-01-17 NOTE — Patient Instructions (Signed)

## 2018-01-24 ENCOUNTER — Ambulatory Visit: Payer: Medicare Other | Admitting: Cardiovascular Disease

## 2018-02-07 ENCOUNTER — Inpatient Hospital Stay: Payer: Medicare Other

## 2018-02-07 ENCOUNTER — Encounter: Payer: Self-pay | Admitting: Cardiovascular Disease

## 2018-02-07 ENCOUNTER — Ambulatory Visit (INDEPENDENT_AMBULATORY_CARE_PROVIDER_SITE_OTHER): Payer: Medicare Other | Admitting: Cardiovascular Disease

## 2018-02-07 ENCOUNTER — Telehealth: Payer: Self-pay | Admitting: Medical

## 2018-02-07 VITALS — BP 143/66 | HR 80 | Temp 98.5°F | Resp 20

## 2018-02-07 DIAGNOSIS — R5383 Other fatigue: Secondary | ICD-10-CM | POA: Diagnosis not present

## 2018-02-07 DIAGNOSIS — I10 Essential (primary) hypertension: Secondary | ICD-10-CM | POA: Diagnosis not present

## 2018-02-07 DIAGNOSIS — N183 Chronic kidney disease, stage 3 unspecified: Secondary | ICD-10-CM

## 2018-02-07 DIAGNOSIS — N189 Chronic kidney disease, unspecified: Secondary | ICD-10-CM | POA: Diagnosis not present

## 2018-02-07 DIAGNOSIS — R296 Repeated falls: Secondary | ICD-10-CM | POA: Diagnosis not present

## 2018-02-07 DIAGNOSIS — D473 Essential (hemorrhagic) thrombocythemia: Secondary | ICD-10-CM | POA: Diagnosis not present

## 2018-02-07 DIAGNOSIS — D631 Anemia in chronic kidney disease: Secondary | ICD-10-CM

## 2018-02-07 DIAGNOSIS — Z87891 Personal history of nicotine dependence: Secondary | ICD-10-CM | POA: Diagnosis not present

## 2018-02-07 DIAGNOSIS — R161 Splenomegaly, not elsewhere classified: Secondary | ICD-10-CM

## 2018-02-07 LAB — COMPREHENSIVE METABOLIC PANEL
ALT: 15 U/L (ref 0–44)
ANION GAP: 7 (ref 5–15)
AST: 20 U/L (ref 15–41)
Albumin: 3.6 g/dL (ref 3.5–5.0)
Alkaline Phosphatase: 75 U/L (ref 38–126)
BUN: 24 mg/dL — ABNORMAL HIGH (ref 8–23)
CHLORIDE: 109 mmol/L (ref 98–111)
CO2: 26 mmol/L (ref 22–32)
Calcium: 9 mg/dL (ref 8.9–10.3)
Creatinine, Ser: 1.53 mg/dL — ABNORMAL HIGH (ref 0.61–1.24)
GFR calc non Af Amer: 39 mL/min — ABNORMAL LOW (ref >60)
GFR, EST AFRICAN AMERICAN: 45 mL/min — AB (ref >60)
Glucose, Bld: 120 mg/dL — ABNORMAL HIGH (ref 70–99)
Potassium: 4.2 mmol/L (ref 3.5–5.1)
SODIUM: 142 mmol/L (ref 135–145)
Total Bilirubin: 0.5 mg/dL (ref 0.3–1.2)
Total Protein: 7 g/dL (ref 6.5–8.1)

## 2018-02-07 LAB — CBC WITH DIFFERENTIAL/PLATELET
BASOS ABS: 0.2 10*3/uL — AB (ref 0.0–0.1)
Basophils Relative: 2 %
EOS ABS: 0.1 10*3/uL (ref 0.0–0.5)
Eosinophils Relative: 1 %
HCT: 33.4 % — ABNORMAL LOW (ref 38.4–49.9)
HEMOGLOBIN: 10.4 g/dL — AB (ref 13.0–17.1)
LYMPHS ABS: 2.2 10*3/uL (ref 0.9–3.3)
Lymphocytes Relative: 20 %
MCH: 35.7 pg — AB (ref 27.2–33.4)
MCHC: 31.1 g/dL — ABNORMAL LOW (ref 32.0–36.0)
MCV: 114.8 fL — ABNORMAL HIGH (ref 79.3–98.0)
Monocytes Absolute: 1.2 10*3/uL — ABNORMAL HIGH (ref 0.1–0.9)
Monocytes Relative: 11 %
NEUTROS PCT: 66 %
Neutro Abs: 7.2 10*3/uL — ABNORMAL HIGH (ref 1.5–6.5)
Platelets: 307 10*3/uL (ref 140–400)
RBC: 2.91 MIL/uL — AB (ref 4.20–5.82)
RDW: 17.8 % — ABNORMAL HIGH (ref 11.0–14.6)
WBC: 10.7 10*3/uL — ABNORMAL HIGH (ref 4.0–10.3)

## 2018-02-07 MED ORDER — DARBEPOETIN ALFA 200 MCG/0.4ML IJ SOSY
300.0000 ug | PREFILLED_SYRINGE | Freq: Once | INTRAMUSCULAR | Status: AC
  Administered 2018-02-07: 300 ug via SUBCUTANEOUS

## 2018-02-07 NOTE — Assessment & Plan Note (Signed)
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  He is an 84 year old married Caucasian male with no cardiac risk factors.  He denies symptoms of syncope.  His falls are all explainable.  He lives on one level home with his wife who he is a primary caregiver for because of her dementia.  There is no cardiovascular potential etiology for his falls.

## 2018-02-07 NOTE — Patient Instructions (Signed)
Your physician recommends that you schedule a follow-up appointment in: AS NEEDED  

## 2018-02-07 NOTE — Progress Notes (Signed)
02/07/2018 Nicholas [name]   1928/01/30  [id number]  Primary Physician Nicholas Lung, MD Primary Cardiologist: Nicholas Harp MD Nicholas [name], [place]  HPI:  Nicholas [name] is a 84 y.o. thin appearing married Caucasian male father of 2 children, grandfather 3 grandchildren is accompanied by his daughter Nicholas [name] today who works at Nicholas [name] office.  Was referred by Nicholas Ogle, PA-C for evaluation of multiple falls.  He has no cardiac risk factors.  There is no family history.  Is never had a heart attack or stroke denies chest pain or shortness of breath.  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  He lives alone with his wife who has dementia.  He is the primary giver.  Does have thrombocytosis followed by Nicholas [name].  He has had multiple falls all of which seem to be mechanical.  There is no evidence of syncope.   Current Meds  Medication Sig  . acetaminophen (TYLENOL) 650 MG CR tablet Take 650 mg by mouth at bedtime.  [name] aspirin 81 MG tablet Take 1 tablet (81 mg total) by mouth daily.  . cetirizine (ZYRTEC) 10 MG tablet Take 10 mg by mouth at bedtime.  . CVS VITAMIN D3 1000 units capsule TAKE 1 TABLET (1,000 UNITS TOTAL) BY MOUTH DAILY.  [name] FLUoxetine (PROZAC) 40 MG capsule Take 1 capsule (40 mg total) by mouth daily. (Patient taking differently: Take 40 mg by mouth at bedtime. )  . hydroxyurea (HYDREA) 500 MG capsule Take 1 capsule (500 mg total) by mouth daily. May take with food to minimize GI side effects.  [name] ibuprofen (ADVIL,MOTRIN) 400 MG tablet Take 1 tablet (400 mg total) by mouth every 6 (six) hours as needed for fever, headache or mild pain.  . Multiple Vitamins-Minerals (PRESERVISION AREDS 2) CAPS Take 1 capsule by mouth 2 (two) times daily.  [name] saccharomyces boulardii (FLORASTOR) 250 MG capsule Take 1 capsule (250 mg total) by mouth 2 (two) times daily.  . sodium chloride (OCEAN) 0.65 % nasal spray  Place 1 spray into the nose 4 (four) times daily as needed for congestion.   [name] terazosin (HYTRIN) 2 MG capsule Take 1 capsule (2 mg total) by mouth at bedtime.     Allergies  Allergen Reactions  . Tape Other (See Comments)    The patient's SKIN IS VERY THIN AND TEARS AND BRUISES VERY EASILY!!  . Erythromycin Other (See Comments)    Gums turned red and bled profusely    Social History   Socioeconomic History  . Marital status: Married    Spouse name: Not on file  . Number of children: Not on file  . Years of education: Not on file  . Highest education level: Not on file  Occupational History    Employer: [employer]    Comment: mowing yards, drives activity school bus  Social Needs  . Financial resource strain: Not on file  . Food insecurity:    Worry: Not on file    Inability: Not on file  . Transportation needs:    Medical: Not on file    Non-medical: Not on file  Tobacco Use  . Smoking status: Former Smoker    Last attempt to quit: 06/14/1958    Years since quitting: 59.6  . Smokeless tobacco: Never Used  Substance and Sexual Activity  . Alcohol use: No  . Drug use: No  .  Sexual activity: Not on file  Lifestyle  . Physical activity:    Days per week: Not on file    Minutes per session: Not on file  . Stress: Not on file  Relationships  . Social connections:    Talks on phone: Not on file    Gets together: Not on file    Attends religious service: Not on file    Active member of club or organization: Not on file    Attends meetings of clubs or organizations: Not on file    Relationship status: Not on file  . Intimate partner violence:    Fear of current or ex partner: Not on file    Emotionally abused: Not on file    Physically abused: Not on file    Forced sexual activity: Not on file  Other Topics Concern  . Not on file  Social History Narrative   Lives at home with wife and dog.  Wife is [name], is her primary caregiver.  Does some gardening.    Exercise with walking.  Former bus Geophysicist/field seismologist.  Daughter Nicholas [name] works here at [employer]: General: negative for chills, fever, night sweats or weight changes.  Cardiovascular: negative for chest pain, dyspnea on exertion, edema, orthopnea, palpitations, paroxysmal nocturnal dyspnea or shortness of breath Dermatological: negative for rash Respiratory: negative for cough or wheezing Urologic: negative for hematuria Abdominal: negative for nausea, vomiting, diarrhea, bright red blood per rectum, melena, or hematemesis Neurologic: negative for visual changes, syncope, or dizziness All other systems reviewed and are otherwise negative except as noted above.    Blood pressure (!) 128/59, [name] 68, height 6\' 3"  (1.905 m), weight 174 lb (78.9 kg), SpO2 95 %.  General appearance: alert and no distress Neck: no adenopathy, no carotid bruit, no JVD, supple, symmetrical, trachea midline and thyroid not enlarged, symmetric, no tenderness/mass/nodules Lungs: clear to auscultation bilaterally Heart: regular rate and rhythm, S1, S2 normal, no murmur, click, rub or gallop Extremities: extremities normal, atraumatic, no cyanosis or edema Pulses: 2+ and symmetric Skin: Skin color, texture, turgor normal. No rashes or lesions Neurologic: Alert and oriented X 3, normal strength and tone. Normal symmetric reflexes. Normal coordination and gait  EKG not performed today  ASSESSMENT AND PLAN:   Essential hypertension History of essential hypertension her blood pressure measured at 120/59.  He is not on antihypertensive medications.  Multiple falls Dr. Rockwell Germany was referred by Nicholas Ogle, PA-C for evaluation of multiple falls.  He is an 84 year old married Caucasian male with no cardiac risk factors.  He denies symptoms of syncope.  His falls are all explainable.  He lives on one level home with his wife who he is a primary caregiver for because of her dementia.  There is no cardiovascular  potential etiology for his falls.      Nicholas Harp MD FACP,FACC,FAHA, [facility] 02/07/2018 2:52 PM

## 2018-02-07 NOTE — Patient Instructions (Signed)

## 2018-02-07 NOTE — Telephone Encounter (Signed)
Lets get in for fasting med check in the next month.  We can also plan flu shot at that time.  Check with [name] his daughter that works up front.   This is a follow up on falls, recent cardiology visit, and general f/u.

## 2018-02-07 NOTE — Assessment & Plan Note (Signed)
History of essential hypertension her blood pressure measured at 120/59.  He is not on antihypertensive medications.

## 2018-02-09 NOTE — Telephone Encounter (Signed)
In the near future I do want to see him back, particularly if stilling have any symptoms like falls, dizziness or other new symptoms, as well as med check.  If no symptoms, we can push it out several weeks.

## 2018-02-09 NOTE — Telephone Encounter (Signed)
Patient went to cardiology Tuesday and got a clean bill of health.  He has had his flu shot. He [facility] labs on Tuesday at the [facility] and they should be in the system.  If you still need to see him let me know.

## 2018-02-09 NOTE — Telephone Encounter (Signed)
Kathy notified.

## 2018-02-19 ENCOUNTER — Other Ambulatory Visit: Payer: Self-pay | Admitting: Medical

## 2018-02-24 ENCOUNTER — Other Ambulatory Visit: Payer: Self-pay | Admitting: Medical

## 2018-02-28 ENCOUNTER — Inpatient Hospital Stay: Payer: Medicare Other

## 2018-02-28 ENCOUNTER — Inpatient Hospital Stay: Payer: Medicare Other | Attending: Oncology

## 2018-02-28 VITALS — BP 134/53 | HR 72 | Temp 97.7°F | Resp 18

## 2018-02-28 DIAGNOSIS — D631 Anemia in chronic kidney disease: Secondary | ICD-10-CM | POA: Diagnosis not present

## 2018-02-28 DIAGNOSIS — D473 Essential (hemorrhagic) thrombocythemia: Secondary | ICD-10-CM

## 2018-02-28 DIAGNOSIS — R161 Splenomegaly, not elsewhere classified: Secondary | ICD-10-CM

## 2018-02-28 DIAGNOSIS — N183 Chronic kidney disease, stage 3 unspecified: Secondary | ICD-10-CM

## 2018-02-28 DIAGNOSIS — N189 Chronic kidney disease, unspecified: Secondary | ICD-10-CM | POA: Insufficient documentation

## 2018-02-28 LAB — COMPREHENSIVE METABOLIC PANEL
ALBUMIN: 3.5 g/dL (ref 3.5–5.0)
ALT: 12 U/L (ref 0–44)
AST: 19 U/L (ref 15–41)
Alkaline Phosphatase: 79 U/L (ref 38–126)
Anion gap: 7 (ref 5–15)
BILIRUBIN TOTAL: 0.6 mg/dL (ref 0.3–1.2)
BUN: 30 mg/dL — AB (ref 8–23)
CO2: 27 mmol/L (ref 22–32)
Calcium: 9.1 mg/dL (ref 8.9–10.3)
Chloride: 110 mmol/L (ref 98–111)
Creatinine, Ser: 1.61 mg/dL — ABNORMAL HIGH (ref 0.61–1.24)
GFR calc Af Amer: 43 mL/min — ABNORMAL LOW (ref >60)
GFR, EST NON AFRICAN AMERICAN: 37 mL/min — AB (ref >60)
Glucose, Bld: 101 mg/dL — ABNORMAL HIGH (ref 70–99)
POTASSIUM: 4.4 mmol/L (ref 3.5–5.1)
SODIUM: 144 mmol/L (ref 135–145)
TOTAL PROTEIN: 7.1 g/dL (ref 6.5–8.1)

## 2018-02-28 LAB — CBC WITH DIFFERENTIAL/PLATELET
BASOS PCT: 1 %
Basophils Absolute: 0.2 10*3/uL — ABNORMAL HIGH (ref 0.0–0.1)
EOS ABS: 0.1 10*3/uL (ref 0.0–0.5)
Eosinophils Relative: 1 %
HCT: 32.4 % — ABNORMAL LOW (ref 38.4–49.9)
HEMOGLOBIN: 10.6 g/dL — AB (ref 13.0–17.1)
Lymphocytes Relative: 14 %
Lymphs Abs: 1.8 10*3/uL (ref 0.9–3.3)
MCH: 36.7 pg — ABNORMAL HIGH (ref 27.2–33.4)
MCHC: 32.7 g/dL (ref 32.0–36.0)
MCV: 112.1 fL — ABNORMAL HIGH (ref 79.3–98.0)
MONO ABS: 1.5 10*3/uL — AB (ref 0.1–0.9)
Monocytes Relative: 12 %
NEUTROS PCT: 72 %
Neutro Abs: 9.2 10*3/uL — ABNORMAL HIGH (ref 1.5–6.5)
Platelets: 323 10*3/uL (ref 140–400)
RBC: 2.89 MIL/uL — AB (ref 4.20–5.82)
RDW: 17.5 % — AB (ref 11.0–14.6)
WBC: 12.8 10*3/uL — AB (ref 4.0–10.3)

## 2018-02-28 MED ORDER — DARBEPOETIN ALFA 200 MCG/0.4ML IJ SOSY
300.0000 ug | PREFILLED_SYRINGE | Freq: Once | INTRAMUSCULAR | Status: AC
  Administered 2018-02-28: 300 ug via SUBCUTANEOUS

## 2018-02-28 NOTE — Patient Instructions (Signed)

## 2018-03-01 LAB — FERRITIN: FERRITIN: 185 ng/mL (ref 24–336)

## 2018-03-03 ENCOUNTER — Other Ambulatory Visit: Payer: Medicare Other

## 2018-03-04 ENCOUNTER — Other Ambulatory Visit: Payer: Self-pay | Admitting: Medical

## 2018-03-06 NOTE — Telephone Encounter (Signed)
Is this ok to refill?  

## 2018-03-13 ENCOUNTER — Telehealth: Payer: Self-pay | Admitting: Family Medicine

## 2018-03-13 NOTE — Telephone Encounter (Signed)
Error

## 2018-03-14 DIAGNOSIS — H353221 Exudative age-related macular degeneration, left eye, with active choroidal neovascularization: Secondary | ICD-10-CM | POA: Diagnosis not present

## 2018-03-21 ENCOUNTER — Encounter: Payer: Self-pay | Admitting: Adult Health

## 2018-03-21 ENCOUNTER — Inpatient Hospital Stay: Payer: Medicare Other

## 2018-03-21 ENCOUNTER — Inpatient Hospital Stay: Payer: Medicare Other | Attending: Oncology

## 2018-03-21 ENCOUNTER — Inpatient Hospital Stay (HOSPITAL_BASED_OUTPATIENT_CLINIC_OR_DEPARTMENT_OTHER): Payer: Medicare Other | Admitting: Adult Health

## 2018-03-21 ENCOUNTER — Telehealth: Payer: Self-pay | Admitting: Adult Health

## 2018-03-21 VITALS — BP 155/68 | HR 74 | Temp 98.1°F | Resp 16 | Ht 75.0 in | Wt 173.0 lb

## 2018-03-21 DIAGNOSIS — Z87891 Personal history of nicotine dependence: Secondary | ICD-10-CM

## 2018-03-21 DIAGNOSIS — N189 Chronic kidney disease, unspecified: Secondary | ICD-10-CM | POA: Insufficient documentation

## 2018-03-21 DIAGNOSIS — D631 Anemia in chronic kidney disease: Secondary | ICD-10-CM

## 2018-03-21 DIAGNOSIS — D473 Essential (hemorrhagic) thrombocythemia: Secondary | ICD-10-CM | POA: Diagnosis not present

## 2018-03-21 DIAGNOSIS — Z79899 Other long term (current) drug therapy: Secondary | ICD-10-CM | POA: Insufficient documentation

## 2018-03-21 DIAGNOSIS — N183 Chronic kidney disease, stage 3 unspecified: Secondary | ICD-10-CM

## 2018-03-21 DIAGNOSIS — D649 Anemia, unspecified: Secondary | ICD-10-CM | POA: Diagnosis not present

## 2018-03-21 DIAGNOSIS — R161 Splenomegaly, not elsewhere classified: Secondary | ICD-10-CM

## 2018-03-21 LAB — COMPREHENSIVE METABOLIC PANEL
ALT: 14 U/L (ref 0–44)
AST: 20 U/L (ref 15–41)
Albumin: 3.6 g/dL (ref 3.5–5.0)
Alkaline Phosphatase: 75 U/L (ref 38–126)
Anion gap: 8 (ref 5–15)
BUN: 27 mg/dL — AB (ref 8–23)
CO2: 25 mmol/L (ref 22–32)
Calcium: 9.1 mg/dL (ref 8.9–10.3)
Chloride: 109 mmol/L (ref 98–111)
Creatinine, Ser: 1.52 mg/dL — ABNORMAL HIGH (ref 0.61–1.24)
GFR calc non Af Amer: 39 mL/min — ABNORMAL LOW (ref >60)
GFR, EST AFRICAN AMERICAN: 46 mL/min — AB (ref >60)
Glucose, Bld: 100 mg/dL — ABNORMAL HIGH (ref 70–99)
POTASSIUM: 4.3 mmol/L (ref 3.5–5.1)
SODIUM: 142 mmol/L (ref 135–145)
Total Bilirubin: 0.6 mg/dL (ref 0.3–1.2)
Total Protein: 7.3 g/dL (ref 6.5–8.1)

## 2018-03-21 LAB — CBC WITH DIFFERENTIAL/PLATELET
ABS IMMATURE GRANULOCYTES: 0.59 10*3/uL — AB (ref 0.00–0.07)
BASOS PCT: 1 %
Basophils Absolute: 0.2 10*3/uL — ABNORMAL HIGH (ref 0.0–0.1)
Eosinophils Absolute: 0 10*3/uL (ref 0.0–0.5)
Eosinophils Relative: 0 %
HCT: 36.2 % — ABNORMAL LOW (ref 39.0–52.0)
Hemoglobin: 11.6 g/dL — ABNORMAL LOW (ref 13.0–17.0)
IMMATURE GRANULOCYTES: 4 %
Lymphocytes Relative: 15 %
Lymphs Abs: 2.1 10*3/uL (ref 0.7–4.0)
MCH: 36.6 pg — AB (ref 26.0–34.0)
MCHC: 32 g/dL (ref 30.0–36.0)
MCV: 114.2 fL — ABNORMAL HIGH (ref 80.0–100.0)
MONO ABS: 1.5 10*3/uL — AB (ref 0.1–1.0)
Monocytes Relative: 11 %
NEUTROS ABS: 9.9 10*3/uL — AB (ref 1.7–7.7)
NEUTROS PCT: 69 %
PLATELETS: 352 10*3/uL (ref 150–400)
RBC: 3.17 MIL/uL — ABNORMAL LOW (ref 4.22–5.81)
RDW: 17.2 % — ABNORMAL HIGH (ref 11.5–15.5)
WBC: 14.3 10*3/uL — AB (ref 4.0–10.5)
nRBC: 0.1 % (ref 0.0–0.2)

## 2018-03-21 MED ORDER — DARBEPOETIN ALFA 300 MCG/0.6ML IJ SOSY
PREFILLED_SYRINGE | INTRAMUSCULAR | Status: AC
  Filled 2018-03-21: qty 0.6

## 2018-03-21 NOTE — Progress Notes (Deleted)
Hgb is 11.6 today no injection needed at this time

## 2018-03-21 NOTE — Progress Notes (Signed)
ID: Nicholas [name]   DOB: 1928/05/27  MR#: [id number]  ELF#:[id number]  PCP: Denita Lung, MD GYN: SU:  OTHER MD:  CHIEF COMPLAINT:  Essential thrombocytosis and chronic anemia  CURRENT TREATMENT:hydroxyurea  INTERVAL HISTORY: Nicholas [name] returns today for follow-up and treatment of his myeloproliferative neoplasm.  Nicholas [name] is accompanied by his daughter Nicholas [name].  Nicholas [name] is doing well today.  Nicholas [name] is taking Hydrea daily and appears to be tolerating it well.  His fatigue is stable.  Nicholas [name] also is receiving Aranesp 329mg every three weeks with good tolerance.  Today his hemoglobin is 11.6 and Nicholas [name] does not require the aranesp.   REVIEW OF SYSTEMS: Nicholas [name] is here with his daughter.  Nicholas [name] has had no new health issues since his last visit.  Nicholas [name] has had no further falls episodes.  His cardiology work up did not show anything that was contributing to his falls.  Nicholas [name] does have prostate issues and has been experiencing more nocturia.    Nicholas [name] is doing well today.  Nicholas [name] denies any new issues such as unusual headaches, vision changes.  Nicholas [name] is without cough, shortness of breath, palpitations or chest pain.  Nicholas [name] denies any bowel changes, nausea, vomiting, indigestion, or stool changes. Otherwise, a detailed ROS was non contributory.    PAST MEDICAL HISTORY: Past Medical History:  Diagnosis Date  . Abnormal CT scan, sinus 10/04/06   pansinusitis, mildly deviated septum  . Allergy   . Anemia    related to CKD  . Chronic sinusitis   . Dysthymia    dysthymia  . H/O echocardiogram 01/04/2007   LV function normal, 55-60% EF, mildly increased thickenss of LV wall  . H/O echocardiogram 12/2006   mild LV wall thickening, EF 55-60%, LV function normal  . History of MRI of brain and brain stem 02/26/06   sinusitis, nonspecific deep white matter changes normal for age  . Hypertension   . Seborrheic keratosis    sees HToni Arthurs NP dermatology  . Thrombocytosis ([place]    sees hematology    PAST SURGICAL HISTORY: Past  Surgical History:  Procedure Laterality Date  . HERNIA REPAIR     right inguinal repair  . I&D EXTREMITY Right 09/16/2017   Procedure: IRRIGATION AND DEBRIDEMENT EXTREMITY;  Surgeon: GRoseanne Kaufman MD;  [place] [facility]  Service: Orthopedics;  Laterality: Right;  . TONSILLECTOMY     childhood  . VARICOSE VEIN SURGERY     right lower leg    FAMILY HISTORY Family History  Problem Relation Age of Onset  . Heart disease Father   . Stroke Father   . Cancer Neg Hx   . COPD Neg Hx   . Diabetes Neg Hx     SOCIAL HISTORY:  (Reviewed 11/28/2013) The patient lives with his wife, who suffers from diabetes and emphysema and has had some small strokes. She is able to care for herself. Nicholas [name] gives her her insulin shots. Nicholas [name] has 2 daughters, SBarbera Setterswho lives in [place] and CIndian Fallswho lives near the [place] works with [employer].The patient has 3 grandchildren..[name]  ADVANCED DIRECTIVES: the patient tells me his daughter KJudd Mccubbinwould make health care decisions if Nicholas [name] is incapacitated   HEALTH MAINTENANCE: (Updated 11/28/2013) Social History   Tobacco Use  . Smoking status: Former Smoker    Last attempt to quit: 06/14/1958    Years since quitting: 59.8  . Smokeless tobacco: Never Used  Substance Use Topics  . Alcohol  use: No  . Drug use: No     Colonoscopy:  Not on file  PSA: Not on file  Bone density: Never  Lipid panel: October 2014  Allergies  Allergen Reactions  . Tape Other (See Comments)    The patient's SKIN IS VERY THIN AND TEARS AND BRUISES VERY EASILY!!  . Erythromycin Other (See Comments)    Gums turned red and bled profusely    Current Outpatient Medications  Medication Sig Dispense Refill  . acetaminophen (TYLENOL) 650 MG CR tablet Take 650 mg by mouth at bedtime.    [name] aspirin 81 MG tablet Take 1 tablet (81 mg total) by mouth daily. 90 tablet 3  . cetirizine (ZYRTEC) 10 MG tablet Take 10 mg by mouth at bedtime.    . [facility] D3 1000 units capsule TAKE 1  CAPSULE BY MOUTH EVERY DAY 90 capsule 0  . CVS VITAMIN D3 1000 units capsule TAKE 1 TABLET (1,000 UNITS TOTAL) BY MOUTH DAILY. 90 capsule 3  . FLUoxetine (PROZAC) 10 MG tablet TAKE 1 TABLET BY MOUTH EVERY DAY 90 tablet 0  . FLUoxetine (PROZAC) 40 MG capsule TAKE 1 CAPSULE BY MOUTH EVERY DAY 90 capsule 0  . hydroxyurea (HYDREA) 500 MG capsule Take 1 capsule (500 mg total) by mouth daily. May take with food to minimize GI side effects. 90 capsule 3  . ibuprofen (ADVIL,MOTRIN) 400 MG tablet Take 1 tablet (400 mg total) by mouth every 6 (six) hours as needed for fever, headache or mild pain. 30 tablet 0  . Multiple Vitamins-Minerals (PRESERVISION AREDS 2) CAPS Take 1 capsule by mouth 2 (two) times daily.    [name] saccharomyces boulardii (FLORASTOR) 250 MG capsule Take 1 capsule (250 mg total) by mouth 2 (two) times daily.    . sodium chloride (OCEAN) 0.65 % nasal spray Place 1 spray into the nose 4 (four) times daily as needed for congestion.     [name] terazosin (HYTRIN) 2 MG capsule TAKE 1 CAPSULE (2 MG TOTAL) BY MOUTH AT BEDTIME. 90 capsule 0   No current facility-administered medications for this visit.     OBJECTIVE:  Vitals:   03/21/18 1254  BP: (!) 155/68  Pulse: 74  Resp: 16  Temp: 98.1 F (36.7 C)  SpO2: 97%     Body mass index is 21.62 kg/m.    ECOG FS: 1 Filed Weights   03/21/18 1254  Weight: 173 lb (78.5 kg)  GENERAL: Patient is an older and tired appearing male in no acute distress HEENT:  Sclerae anicteric.  Oropharynx clear and moist. No ulcerations or evidence of oropharyngeal candidiasis. Neck is supple.  NODES:  No cervical, supraclavicular, or axillary lymphadenopathy palpated.  LUNGS:  Clear to auscultation bilaterally.  No wheezes or rhonchi. HEART:  Regular rate and rhythm. No murmur appreciated. ABDOMEN:  Soft, nontender.  Positive, normoactive bowel sounds. No organomegaly palpated. MSK:  No focal spinal tenderness to palpation.  EXTREMITIES:  No peripheral edema.    SKIN:  Clear with no obvious rashes or skin changes. No nail dyscrasia. NEURO:  Nonfocal. Well oriented.  Appropriate affect.    LAB RESULTS:   Lab Results  Component Value Date   WBC 14.3 (H) 03/21/2018   NEUTROABS 9.9 (H) 03/21/2018   HGB 11.6 (L) 03/21/2018   HCT 36.2 (L) 03/21/2018   MCV 114.2 (H) 03/21/2018   PLT 352 03/21/2018      Chemistry      Component Value Date/Time   NA 144 02/28/2018 1425  NA 142 06/03/2017 1404   K 4.4 02/28/2018 1425   K 3.9 06/03/2017 1404   CL 110 02/28/2018 1425   CO2 27 02/28/2018 1425   CO2 26 06/03/2017 1404   BUN 30 (H) 02/28/2018 1425   BUN 19.9 06/03/2017 1404   CREATININE 1.61 (H) 02/28/2018 1425   CREATININE 1.6 (H) 06/03/2017 1404      Component Value Date/Time   CALCIUM 9.1 02/28/2018 1425   CALCIUM 8.9 06/03/2017 1404   ALKPHOS 79 02/28/2018 1425   ALKPHOS 91 06/03/2017 1404   AST 19 02/28/2018 1425   AST 20 06/03/2017 1404   ALT 12 02/28/2018 1425   ALT 12 06/03/2017 1404   BILITOT 0.6 02/28/2018 1425   BILITOT 0.48 06/03/2017 1404       STUDIES: Bone marrow biopsy results given to the patient and discussed with him and his daughter  ASSESSMENT: 6 y.o.  [place] man with   (1)  a history of essential thrombocytosis initially diagnosed December 2003,  controlled on anagrelide 0.5 mg daily and aspirin 81 mg daily. (stopped 06/2017)  (a) no evidence of leukemic transformation  (b) Hydrea 526m daily starting in 07/2017)  (2) anemia, mostly secondary to chronic kidney disease, receiving aranesp 3056m every 3 weeks  (3) possible transition to myelofibrosis on bone marrow biopsy 06/22/2017,  (a) normal cytogenetics  (b) Nicholas [name] studies pending  PLAN:  Jayren is doing well today.  Nicholas [name] continues on Hydrea daily and Nicholas [name] is tolerating this well.  Nicholas [name] is taking 50028maily.  Nicholas [name] is also receiving aranesp every 3 weeks.  Today, his hemoglobin is 11.6.  Nicholas [name] does not require an injection today.  I reviewed this with him  in detail.  I am delighted that his cardiology work up didn't find anything of concern, and that Nicholas [name] has not had any further falls.    I reviewed the above with Dr. MagJana Hakimlong with [facility]'s labs today.  Nicholas [name] will continue to return every 3 weeks for lab and injection, and will see me back in 9 weeks.  Samik and his family know to call for any questions or concerns prior to his next visit with us.[place]  A total of (20) minutes of face-to-face time was spent with this patient with greater than 50% of that time in counseling and care-coordination.    LinWilber BihariP 03/21/18 12:56 PM Medical Oncology and Hematology [id number] [id number]. [phone] Fax. [phone]

## 2018-03-21 NOTE — Telephone Encounter (Signed)
Gave pt avs and calendar  °

## 2018-04-11 ENCOUNTER — Inpatient Hospital Stay: Payer: Medicare Other

## 2018-04-11 VITALS — BP 138/62 | HR 71 | Temp 98.1°F | Resp 18

## 2018-04-11 DIAGNOSIS — D649 Anemia, unspecified: Secondary | ICD-10-CM | POA: Diagnosis not present

## 2018-04-11 DIAGNOSIS — N189 Chronic kidney disease, unspecified: Secondary | ICD-10-CM | POA: Diagnosis not present

## 2018-04-11 DIAGNOSIS — D473 Essential (hemorrhagic) thrombocythemia: Secondary | ICD-10-CM

## 2018-04-11 DIAGNOSIS — D631 Anemia in chronic kidney disease: Secondary | ICD-10-CM

## 2018-04-11 DIAGNOSIS — Z87891 Personal history of nicotine dependence: Secondary | ICD-10-CM | POA: Diagnosis not present

## 2018-04-11 DIAGNOSIS — N183 Chronic kidney disease, stage 3 unspecified: Secondary | ICD-10-CM

## 2018-04-11 DIAGNOSIS — Z79899 Other long term (current) drug therapy: Secondary | ICD-10-CM | POA: Diagnosis not present

## 2018-04-11 DIAGNOSIS — R161 Splenomegaly, not elsewhere classified: Secondary | ICD-10-CM

## 2018-04-11 LAB — CBC WITH DIFFERENTIAL/PLATELET
Abs Immature Granulocytes: 0.68 10*3/uL — ABNORMAL HIGH (ref 0.00–0.07)
BASOS ABS: 0.2 10*3/uL — AB (ref 0.0–0.1)
Basophils Relative: 1 %
EOS PCT: 0 %
Eosinophils Absolute: 0 10*3/uL (ref 0.0–0.5)
HEMATOCRIT: 32.5 % — AB (ref 39.0–52.0)
Hemoglobin: 10.6 g/dL — ABNORMAL LOW (ref 13.0–17.0)
IMMATURE GRANULOCYTES: 5 %
LYMPHS ABS: 2.3 10*3/uL (ref 0.7–4.0)
LYMPHS PCT: 17 %
MCH: 37.1 pg — ABNORMAL HIGH (ref 26.0–34.0)
MCHC: 32.6 g/dL (ref 30.0–36.0)
MCV: 113.6 fL — ABNORMAL HIGH (ref 80.0–100.0)
Monocytes Absolute: 1.3 10*3/uL — ABNORMAL HIGH (ref 0.1–1.0)
Monocytes Relative: 9 %
NRBC: 0.2 % (ref 0.0–0.2)
Neutro Abs: 9.3 10*3/uL — ABNORMAL HIGH (ref 1.7–7.7)
Neutrophils Relative %: 68 %
PLATELETS: 344 10*3/uL (ref 150–400)
RBC: 2.86 MIL/uL — ABNORMAL LOW (ref 4.22–5.81)
RDW: 16.8 % — AB (ref 11.5–15.5)
WBC: 13.6 10*3/uL — ABNORMAL HIGH (ref 4.0–10.5)

## 2018-04-11 LAB — COMPREHENSIVE METABOLIC PANEL
ALT: 14 U/L (ref 0–44)
AST: 20 U/L (ref 15–41)
Albumin: 3.6 g/dL (ref 3.5–5.0)
Alkaline Phosphatase: 78 U/L (ref 38–126)
Anion gap: 9 (ref 5–15)
BUN: 23 mg/dL (ref 8–23)
CHLORIDE: 107 mmol/L (ref 98–111)
CO2: 27 mmol/L (ref 22–32)
Calcium: 8.9 mg/dL (ref 8.9–10.3)
Creatinine, Ser: 1.58 mg/dL — ABNORMAL HIGH (ref 0.61–1.24)
GFR calc non Af Amer: 38 mL/min — ABNORMAL LOW (ref >60)
GFR, EST AFRICAN AMERICAN: 44 mL/min — AB (ref >60)
Glucose, Bld: 124 mg/dL — ABNORMAL HIGH (ref 70–99)
POTASSIUM: 4.4 mmol/L (ref 3.5–5.1)
Sodium: 143 mmol/L (ref 135–145)
Total Bilirubin: 0.6 mg/dL (ref 0.3–1.2)
Total Protein: 7 g/dL (ref 6.5–8.1)

## 2018-04-11 MED ORDER — DARBEPOETIN ALFA 300 MCG/0.6ML IJ SOSY
PREFILLED_SYRINGE | INTRAMUSCULAR | Status: AC
  Filled 2018-04-11: qty 0.6

## 2018-04-11 MED ORDER — DARBEPOETIN ALFA 200 MCG/0.4ML IJ SOSY
300.0000 ug | PREFILLED_SYRINGE | Freq: Once | INTRAMUSCULAR | Status: AC
  Administered 2018-04-11: 300 ug via SUBCUTANEOUS

## 2018-04-11 NOTE — Patient Instructions (Signed)

## 2018-04-25 ENCOUNTER — Telehealth: Payer: Self-pay | Admitting: Medical

## 2018-04-25 NOTE — Telephone Encounter (Signed)
Pt needs refills of both strengths of Prozac 90 days each to [place].

## 2018-04-26 ENCOUNTER — Other Ambulatory Visit: Payer: Self-pay | Admitting: Medical

## 2018-04-26 MED ORDER — FLUOXETINE HCL 40 MG PO CAPS: ORAL_CAPSULE | ORAL | 0 refills | Status: DC

## 2018-04-26 MED ORDER — FLUOXETINE HCL 10 MG PO TABS: 10.0000 mg | ORAL_TABLET | Freq: Every day | ORAL | 0 refills | Status: DC

## 2018-04-26 NOTE — Telephone Encounter (Signed)
Daughter Kathy informed.

## 2018-04-26 NOTE — Telephone Encounter (Signed)
I sent 30 day supply, due to need for appt.  Get him in for 51mo med check

## 2018-04-27 NOTE — Telephone Encounter (Signed)
Appt made 05/09/18

## 2018-05-02 ENCOUNTER — Inpatient Hospital Stay: Payer: Medicare Other

## 2018-05-02 ENCOUNTER — Inpatient Hospital Stay: Payer: Medicare Other | Attending: Oncology

## 2018-05-02 VITALS — BP 129/58 | HR 74 | Temp 98.7°F | Resp 18

## 2018-05-02 DIAGNOSIS — N183 Chronic kidney disease, stage 3 unspecified: Secondary | ICD-10-CM

## 2018-05-02 DIAGNOSIS — R161 Splenomegaly, not elsewhere classified: Secondary | ICD-10-CM

## 2018-05-02 DIAGNOSIS — D631 Anemia in chronic kidney disease: Secondary | ICD-10-CM

## 2018-05-02 DIAGNOSIS — D649 Anemia, unspecified: Secondary | ICD-10-CM | POA: Diagnosis not present

## 2018-05-02 DIAGNOSIS — N189 Chronic kidney disease, unspecified: Secondary | ICD-10-CM | POA: Insufficient documentation

## 2018-05-02 DIAGNOSIS — D473 Essential (hemorrhagic) thrombocythemia: Secondary | ICD-10-CM

## 2018-05-02 LAB — COMPREHENSIVE METABOLIC PANEL
ALBUMIN: 3.5 g/dL (ref 3.5–5.0)
ALT: 13 U/L (ref 0–44)
AST: 20 U/L (ref 15–41)
Alkaline Phosphatase: 69 U/L (ref 38–126)
Anion gap: 10 (ref 5–15)
BUN: 30 mg/dL — AB (ref 8–23)
CO2: 24 mmol/L (ref 22–32)
Calcium: 8.8 mg/dL — ABNORMAL LOW (ref 8.9–10.3)
Chloride: 110 mmol/L (ref 98–111)
Creatinine, Ser: 1.66 mg/dL — ABNORMAL HIGH (ref 0.61–1.24)
GFR calc Af Amer: 41 mL/min — ABNORMAL LOW (ref >60)
GFR calc non Af Amer: 35 mL/min — ABNORMAL LOW (ref >60)
GLUCOSE: 109 mg/dL — AB (ref 70–99)
POTASSIUM: 4.5 mmol/L (ref 3.5–5.1)
Sodium: 144 mmol/L (ref 135–145)
Total Bilirubin: 0.5 mg/dL (ref 0.3–1.2)
Total Protein: 7 g/dL (ref 6.5–8.1)

## 2018-05-02 LAB — FERRITIN: FERRITIN: 193 ng/mL (ref 24–336)

## 2018-05-02 LAB — CBC WITH DIFFERENTIAL/PLATELET
Abs Immature Granulocytes: 0.31 10*3/uL — ABNORMAL HIGH (ref 0.00–0.07)
BASOS ABS: 0.2 10*3/uL — AB (ref 0.0–0.1)
Basophils Relative: 1 %
EOS ABS: 0 10*3/uL (ref 0.0–0.5)
EOS PCT: 0 %
HCT: 33.8 % — ABNORMAL LOW (ref 39.0–52.0)
Hemoglobin: 10.6 g/dL — ABNORMAL LOW (ref 13.0–17.0)
IMMATURE GRANULOCYTES: 2 %
Lymphocytes Relative: 19 %
Lymphs Abs: 2.4 10*3/uL (ref 0.7–4.0)
MCH: 35.7 pg — ABNORMAL HIGH (ref 26.0–34.0)
MCHC: 31.4 g/dL (ref 30.0–36.0)
MCV: 113.8 fL — ABNORMAL HIGH (ref 80.0–100.0)
Monocytes Absolute: 1.4 10*3/uL — ABNORMAL HIGH (ref 0.1–1.0)
Monocytes Relative: 11 %
NEUTROS PCT: 67 %
NRBC: 0 % (ref 0.0–0.2)
Neutro Abs: 8.5 10*3/uL — ABNORMAL HIGH (ref 1.7–7.7)
PLATELETS: 318 10*3/uL (ref 150–400)
RBC: 2.97 MIL/uL — AB (ref 4.22–5.81)
RDW: 17.8 % — AB (ref 11.5–15.5)
WBC: 12.7 10*3/uL — AB (ref 4.0–10.5)

## 2018-05-02 MED ORDER — DARBEPOETIN ALFA 200 MCG/0.4ML IJ SOSY: 300.0000 ug | PREFILLED_SYRINGE | Freq: Once | INTRAMUSCULAR | Status: DC

## 2018-05-02 MED ORDER — DARBEPOETIN ALFA 300 MCG/0.6ML IJ SOSY
300.0000 ug | PREFILLED_SYRINGE | Freq: Once | INTRAMUSCULAR | Status: AC
  Administered 2018-05-02: 300 ug via SUBCUTANEOUS

## 2018-05-02 MED ORDER — DARBEPOETIN ALFA 300 MCG/0.6ML IJ SOSY
PREFILLED_SYRINGE | INTRAMUSCULAR | Status: AC
  Filled 2018-05-02: qty 0.6

## 2018-05-02 NOTE — Patient Instructions (Signed)

## 2018-05-09 ENCOUNTER — Encounter: Payer: Self-pay | Admitting: Medical

## 2018-05-09 ENCOUNTER — Ambulatory Visit (INDEPENDENT_AMBULATORY_CARE_PROVIDER_SITE_OTHER): Payer: Medicare Other | Admitting: Medical

## 2018-05-09 VITALS — BP 126/70 | HR 86 | Temp 98.6°F | Resp 16 | Ht 74.0 in | Wt 172.8 lb

## 2018-05-09 DIAGNOSIS — Z636 Dependent relative needing care at home: Secondary | ICD-10-CM | POA: Insufficient documentation

## 2018-05-09 DIAGNOSIS — R413 Other amnesia: Secondary | ICD-10-CM | POA: Diagnosis not present

## 2018-05-09 DIAGNOSIS — K0889 Other specified disorders of teeth and supporting structures: Secondary | ICD-10-CM | POA: Diagnosis not present

## 2018-05-09 DIAGNOSIS — F341 Dysthymic disorder: Secondary | ICD-10-CM | POA: Diagnosis not present

## 2018-05-09 DIAGNOSIS — L989 Disorder of the skin and subcutaneous tissue, unspecified: Secondary | ICD-10-CM | POA: Diagnosis not present

## 2018-05-09 DIAGNOSIS — J309 Allergic rhinitis, unspecified: Secondary | ICD-10-CM

## 2018-05-09 DIAGNOSIS — I10 Essential (primary) hypertension: Secondary | ICD-10-CM

## 2018-05-09 NOTE — Progress Notes (Signed)
Subjective: Chief Complaint  Patient presents with  . follow up    follow up   . Headache   Here for follow-up at my request for med check.  He reports compliance of medications.  He says he is mostly trying to survive day to day watching wife's disease and her decline.  She has dementia, diabetes, currently has leg wounds are requiring home health to come out and wrap her legs daily.  Doesn't feel he can do a lot of things to help her.   He manages the house though.    With her dementia, one night she is good, the next night she can be up all night folding clothes or doing stuff in the house keeping them both awake. His daughters come over to help bath wife.    He keeps up the household along with his daughters that come over to help.  He handles his own financial affairs.  He reports no major concerns with he is memory, but sometimes loses things.     He does the cooking or family brings over food. Hasn't been to church lately due to his and her limitations.  No counseling.  Doesn't feel unsafe currently.  Eating 3 times daily.  He walks the dog some, walks in the yard some No recent falls.  No nurse aid, home health nurse is helping with leg wounds.   Teeth hurt to chew in front, has partial plate,  sees dentist 05/16/18.   Objective: BP 126/70   Pulse 86   Temp 98.6 F (37 C) (Oral)   Resp 16   Ht 6\' 2"  (1.88 m)   Wt 172 lb 12.8 oz (78.4 kg)   SpO2 95%   BMI 22.19 kg/m   Wt Readings from Last 3 Encounters:  05/09/18 172 lb 12.8 oz (78.4 kg)  03/21/18 173 lb (78.5 kg)  02/07/18 174 lb (78.9 kg)   General appearance: alert, no distress, WD/WN,  HEENT: normocephalic, sclerae anicteric, TMs pearly, nares patent, no discharge or erythema, pharynx normal Oral cavity: MMM, upper partial, moderate plaque of lower teeth, teeth otherwise in good repair, no lesions Neck: supple, no lymphadenopathy, no thyromegaly, no masses Heart: RRR, normal S1, S2, no murmurs Lungs: CTA  bilaterally, no wheezes, rhonchi, or rales Skin: Right superior laterally posterior earlobe with centimeter area of pearly looking skin with crusting and some dried blood, somewhat amorphous lesion, right upper back with small sebaceous cyst with pore opening, but no fluctuance no induration or erythema no drainage Pulses: 2+ symmetric, upper and lower extremities, normal cap refill    Assessment: Encounter Diagnoses  Name Primary?  . Dysthymia Yes  . Caregiver stress   . Memory loss   . Essential hypertension   . Skin lesion of face   . Allergic rhinitis, unspecified seasonality, unspecified trigger   . Tooth pain      Plan: Discussed medications, concerns, reviewed MMSE 28/30 and PHQ9 score of 1 today.  Discussed following recommendations:  Depressed mood  Continue the 40 mg +50 mg tablet of fluoxetine/Prozac daily  I recommend you establish with a counselor for some counseling to help cope with the current stress and situation with being a caregiver and a husband  I recommend you journal or do a diary daily which can be therapeutic but also help you cope with the situation  Teeth pain-follow-up with your dentist soon as planned on December 3  You have a skin lesion on the right ear that may need some  attention.  I recommend a referral back to dermatology.  Let me know if agreeable to this  Allergies  continue Zyrtec daily tablet at bedtime for allergies  I recommend we try you on an allergy nasal spray Flonase for the time being to see if this helps  You can still do a daily nasal saline spray in the evening to clear out on her dust from her nostrils  Continue current medications otherwise  Considerations going forward  Neurology consult for memory concerns  Nurse aide to help with wife and day-to-day activities of daily living  Dermatology consult  If you start having problems getting food, Meals on Wheels and other local organizations may be able to  help  If you have not done a last will, living will, and healthcare power of attorney you should do these.  These documents help you to plan for the future but also to help avoid leaving a burden on other family members  Jaicion was seen today for follow up and headache.  Diagnoses and all orders for this visit:  Dysthymia  Caregiver stress  Memory loss  Essential hypertension  Skin lesion of face  Allergic rhinitis, unspecified seasonality, unspecified trigger  Tooth pain  Other orders -     FLUoxetine (PROZAC) 40 MG capsule; TAKE 1 CAPSULE BY MOUTH EVERY DAY -     FLUoxetine (PROZAC) 10 MG tablet; Take 1 tablet (10 mg total) by mouth daily.

## 2018-05-09 NOTE — Patient Instructions (Signed)
  Depressed mood  Continue the 40 mg +50 mg tablet of fluoxetine/Prozac daily  I recommend you establish with a counselor for some counseling to help cope with the current stress and situation with being a caregiver and a husband  I recommend you journal or do a diary daily which can be therapeutic but also help you cope with the situation  Teeth pain-follow-up with your dentist soon as planned on December 3  You have a skin lesion on the right ear that may need some attention.  I recommend a referral back to dermatology.  Let me know if agreeable to this  Allergies  continue Zyrtec daily tablet at bedtime for allergies  I recommend we try you on an allergy nasal spray Flonase for the time being to see if this helps  You can still do a daily nasal saline spray in the evening to clear out on her dust from her nostrils  Continue current medications otherwise  Considerations going forward  Neurology consult for memory concerns  Nurse aide to help with wife and day-to-day activities of daily living  Dermatology consult  If you start having problems getting food, Meals on Wheels and other local organizations may be able to help  If you have not done a last will, living will, and healthcare power of attorney you should do these.  These documents help you to plan for the future but also to help avoid leaving a burden on other family members

## 2018-05-10 ENCOUNTER — Telehealth: Payer: Self-pay | Admitting: Medical

## 2018-05-10 MED ORDER — FLUOXETINE HCL 40 MG PO CAPS: ORAL_CAPSULE | ORAL | 0 refills | Status: DC

## 2018-05-10 MED ORDER — FLUOXETINE HCL 10 MG PO TABS: 10.0000 mg | ORAL_TABLET | Freq: Every day | ORAL | 0 refills | Status: DC

## 2018-05-10 NOTE — Telephone Encounter (Signed)
Call back Wednesday 05/17/18 to make sure he made appt with dermatology and see if he is ok with baseline eval with neurology regarding memory.

## 2018-05-10 NOTE — Telephone Encounter (Signed)
See msg

## 2018-05-17 NOTE — Telephone Encounter (Signed)
Per [name] she will ask her dad about referrals.

## 2018-05-23 ENCOUNTER — Inpatient Hospital Stay: Payer: Medicare Other | Attending: Oncology

## 2018-05-23 ENCOUNTER — Inpatient Hospital Stay (HOSPITAL_BASED_OUTPATIENT_CLINIC_OR_DEPARTMENT_OTHER): Payer: Medicare Other | Admitting: Adult Health

## 2018-05-23 ENCOUNTER — Telehealth: Payer: Self-pay | Admitting: Oncology

## 2018-05-23 ENCOUNTER — Encounter: Payer: Self-pay | Admitting: Adult Health

## 2018-05-23 ENCOUNTER — Inpatient Hospital Stay: Payer: Medicare Other

## 2018-05-23 VITALS — BP 128/63 | HR 78 | Temp 99.1°F | Resp 18 | Ht 74.0 in | Wt 173.5 lb

## 2018-05-23 DIAGNOSIS — Z7982 Long term (current) use of aspirin: Secondary | ICD-10-CM

## 2018-05-23 DIAGNOSIS — D473 Essential (hemorrhagic) thrombocythemia: Secondary | ICD-10-CM | POA: Insufficient documentation

## 2018-05-23 DIAGNOSIS — D649 Anemia, unspecified: Secondary | ICD-10-CM | POA: Diagnosis not present

## 2018-05-23 DIAGNOSIS — Z87891 Personal history of nicotine dependence: Secondary | ICD-10-CM | POA: Diagnosis not present

## 2018-05-23 DIAGNOSIS — N189 Chronic kidney disease, unspecified: Secondary | ICD-10-CM | POA: Insufficient documentation

## 2018-05-23 DIAGNOSIS — D631 Anemia in chronic kidney disease: Secondary | ICD-10-CM

## 2018-05-23 DIAGNOSIS — N183 Chronic kidney disease, stage 3 unspecified: Secondary | ICD-10-CM

## 2018-05-23 DIAGNOSIS — R161 Splenomegaly, not elsewhere classified: Secondary | ICD-10-CM

## 2018-05-23 LAB — CBC WITH DIFFERENTIAL/PLATELET
Abs Immature Granulocytes: 0.73 10*3/uL — ABNORMAL HIGH (ref 0.00–0.07)
Basophils Absolute: 0.2 10*3/uL — ABNORMAL HIGH (ref 0.0–0.1)
Basophils Relative: 1 %
EOS PCT: 0 %
Eosinophils Absolute: 0 10*3/uL (ref 0.0–0.5)
HCT: 36.1 % — ABNORMAL LOW (ref 39.0–52.0)
Hemoglobin: 11.2 g/dL — ABNORMAL LOW (ref 13.0–17.0)
Immature Granulocytes: 5 %
LYMPHS PCT: 15 %
Lymphs Abs: 2.3 10*3/uL (ref 0.7–4.0)
MCH: 35.6 pg — AB (ref 26.0–34.0)
MCHC: 31 g/dL (ref 30.0–36.0)
MCV: 114.6 fL — ABNORMAL HIGH (ref 80.0–100.0)
Monocytes Absolute: 1.8 10*3/uL — ABNORMAL HIGH (ref 0.1–1.0)
Monocytes Relative: 12 %
Neutro Abs: 10.4 10*3/uL — ABNORMAL HIGH (ref 1.7–7.7)
Neutrophils Relative %: 67 %
Platelets: 361 10*3/uL (ref 150–400)
RBC: 3.15 MIL/uL — ABNORMAL LOW (ref 4.22–5.81)
RDW: 17.8 % — ABNORMAL HIGH (ref 11.5–15.5)
WBC: 15.3 10*3/uL — ABNORMAL HIGH (ref 4.0–10.5)
nRBC: 0.1 % (ref 0.0–0.2)

## 2018-05-23 LAB — COMPREHENSIVE METABOLIC PANEL
ALT: 13 U/L (ref 0–44)
ANION GAP: 8 (ref 5–15)
AST: 19 U/L (ref 15–41)
Albumin: 3.7 g/dL (ref 3.5–5.0)
Alkaline Phosphatase: 71 U/L (ref 38–126)
BUN: 30 mg/dL — ABNORMAL HIGH (ref 8–23)
CO2: 24 mmol/L (ref 22–32)
Calcium: 9 mg/dL (ref 8.9–10.3)
Chloride: 110 mmol/L (ref 98–111)
Creatinine, Ser: 1.64 mg/dL — ABNORMAL HIGH (ref 0.61–1.24)
GFR calc non Af Amer: 37 mL/min — ABNORMAL LOW (ref >60)
GFR, EST AFRICAN AMERICAN: 43 mL/min — AB (ref >60)
Glucose, Bld: 100 mg/dL — ABNORMAL HIGH (ref 70–99)
Potassium: 4.4 mmol/L (ref 3.5–5.1)
SODIUM: 142 mmol/L (ref 135–145)
Total Bilirubin: 0.6 mg/dL (ref 0.3–1.2)
Total Protein: 7.2 g/dL (ref 6.5–8.1)

## 2018-05-23 MED ORDER — DARBEPOETIN ALFA 300 MCG/0.6ML IJ SOSY
PREFILLED_SYRINGE | INTRAMUSCULAR | Status: AC
  Filled 2018-05-23: qty 0.6

## 2018-05-23 NOTE — Progress Notes (Signed)
ID: Nicholas [name]   DOB: [birth year]  MR#: [id number]  TAV#:[id number]  PCP: Denita Lung, MD GYN: SU:  OTHER MD:  CHIEF COMPLAINT:  Essential thrombocytosis and chronic anemia  CURRENT TREATMENT:hydroxyurea; Aranesp  INTERVAL HISTORY: Nicholas [name] returns today for follow-up and treatment of his myeloproliferative neoplasm.  He is accompanied by his daughter Nicholas [name].  He is doing well today.  He is taking Hydrea daily and appears to be tolerating it well.  His fatigue is stable.  He also is receiving Aranesp 379mg every three weeks with good tolerance.     REVIEW OF SYSTEMS: Nicholas [name] is doing well today.  He continues on the hydrea and is tolerating it well.  He is fatigued.  He just celebrated his 84thbirthday.  He notes that he had a good thanksgiving.  He denies any unusual headaches or vision changes.  He has a mild NP cough related to post nasal drip.  He denies shortness of breath, chest pain, or palpitations.  He denies nausea, vomiting, constipation, or diarrhea.  A detailed ROS was otherwise non contributory.   PAST MEDICAL HISTORY: Past Medical History:  Diagnosis Date  . Abnormal CT scan, sinus 10/04/06   pansinusitis, mildly deviated septum  . Allergy   . Anemia    related to CKD  . Chronic sinusitis   . Dysthymia    dysthymia  . H/O echocardiogram 01/04/2007   LV function normal, 55-60% EF, mildly increased thickenss of LV wall  . H/O echocardiogram 12/2006   mild LV wall thickening, EF 55-60%, LV function normal  . History of MRI of brain and brain stem 02/26/06   sinusitis, nonspecific deep white matter changes normal for age  . Hypertension   . Seborrheic keratosis    sees HToni Arthurs NP dermatology  . Thrombocytosis ([place]    sees hematology    PAST SURGICAL HISTORY: Past Surgical History:  Procedure Laterality Date  . HERNIA REPAIR     right inguinal repair  . I&D EXTREMITY Right 09/16/2017   Procedure: IRRIGATION AND DEBRIDEMENT EXTREMITY;  Surgeon:  GRoseanne Kaufman MD;  [place] [facility]  Service: Orthopedics;  Laterality: Right;  . TONSILLECTOMY     childhood  . VARICOSE VEIN SURGERY     right lower leg    FAMILY HISTORY Family History  Problem Relation Age of Onset  . Heart disease Father   . Stroke Father   . Cancer Neg Hx   . COPD Neg Hx   . Diabetes Neg Hx     SOCIAL HISTORY:  (Reviewed 11/28/2013) The patient lives with his wife, who suffers from diabetes and emphysema and has had some small strokes. She is able to care for herself. He gives her her insulin shots. He has 2 daughters, SBarbera Setterswho lives in [place] and CArispewho lives [place] works with [employer].The patient has 3 grandchildren..[name]  ADVANCED DIRECTIVES: the patient tells me his daughter KAzul Brumettwould make health care decisions if he is incapacitated   HEALTH MAINTENANCE: (Updated 11/28/2013) Social History   Tobacco Use  . Smoking status: Former Smoker    Last attempt to quit: 06/14/1958    Years since quitting: 59.9  . Smokeless tobacco: Never Used  Substance Use Topics  . Alcohol use: No  . Drug use: No     Colonoscopy:  Not on file  PSA: Not on file  Bone density: Never  Lipid panel: October 2014  Allergies  Allergen Reactions  . Tape Other (See Comments)    The patient's SKIN IS VERY THIN AND TEARS AND BRUISES VERY EASILY!!  . Erythromycin Other (See Comments)    Gums turned red and bled profusely    Current Outpatient Medications  Medication Sig Dispense Refill  . acetaminophen (TYLENOL) 650 MG CR tablet Take 650 mg by mouth at bedtime.    [name] aspirin 81 MG tablet Take 1 tablet (81 mg total) by mouth daily. 90 tablet 3  . cetirizine (ZYRTEC) 10 MG tablet Take 10 mg by mouth at bedtime.    . CVS VITAMIN D3 1000 units capsule TAKE 1 TABLET (1,000 UNITS TOTAL) BY MOUTH DAILY. 90 capsule 3  . FLUoxetine (PROZAC) 10 MG tablet Take 1 tablet (10 mg total) by mouth daily. 90 tablet 0  . FLUoxetine (PROZAC) 40 MG  capsule TAKE 1 CAPSULE BY MOUTH EVERY DAY 90 capsule 0  . hydroxyurea (HYDREA) 500 MG capsule Take 1 capsule (500 mg total) by mouth daily. May take with food to minimize GI side effects. 90 capsule 3  . ibuprofen (ADVIL,MOTRIN) 400 MG tablet Take 1 tablet (400 mg total) by mouth every 6 (six) hours as needed for fever, headache or mild pain. 30 tablet 0  . Multiple Vitamins-Minerals (PRESERVISION AREDS 2) CAPS Take 1 capsule by mouth 2 (two) times daily.    [name] saccharomyces boulardii (FLORASTOR) 250 MG capsule Take 1 capsule (250 mg total) by mouth 2 (two) times daily.    . sodium chloride (OCEAN) 0.65 % nasal spray Place 1 spray into the nose 4 (four) times daily as needed for congestion.     [name] terazosin (HYTRIN) 2 MG capsule TAKE 1 CAPSULE (2 MG TOTAL) BY MOUTH AT BEDTIME. 90 capsule 0   No current facility-administered medications for this visit.     OBJECTIVE:  Vitals:   05/23/18 1414  BP: 128/63  Pulse: 78  Resp: 18  Temp: 99.1 F (37.3 C)  SpO2: 96%     Body mass index is 22.28 kg/m.    ECOG FS: 1 Filed Weights   05/23/18 1414  Weight: 173 lb 8 oz (78.7 kg)  GENERAL: Patient is an older and tired appearing male in no acute distress HEENT:  Sclerae anicteric.  Oropharynx clear and moist. No ulcerations or evidence of oropharyngeal candidiasis. Neck is supple.  NODES:  No cervical, supraclavicular, or axillary lymphadenopathy palpated.  LUNGS:  Clear to auscultation bilaterally.  No wheezes or rhonchi. HEART:  Regular rate and rhythm. No murmur appreciated. ABDOMEN:  Soft, nontender.  Positive, normoactive bowel sounds. No organomegaly palpated. MSK:  No focal spinal tenderness to palpation.  EXTREMITIES:  No peripheral edema.   SKIN:  Clear with no obvious rashes or skin changes. No nail dyscrasia. NEURO:  Nonfocal. Well oriented.  Appropriate affect.    LAB RESULTS:   Lab Results  Component Value Date   WBC 15.3 (H) 05/23/2018   NEUTROABS 10.4 (H) 05/23/2018   HGB  11.2 (L) 05/23/2018   HCT 36.1 (L) 05/23/2018   MCV 114.6 (H) 05/23/2018   PLT 361 05/23/2018      Chemistry      Component Value Date/Time   NA 144 05/02/2018 1354   NA 142 06/03/2017 1404   K 4.5 05/02/2018 1354   K 3.9 06/03/2017 1404   CL 110 05/02/2018 1354   CO2 24 05/02/2018 1354   CO2 26 06/03/2017 1404   BUN 30 (H) 05/02/2018 1354   BUN 19.9 06/03/2017  1404   CREATININE 1.66 (H) 05/02/2018 1354   CREATININE 1.6 (H) 06/03/2017 1404      Component Value Date/Time   CALCIUM 8.8 (L) 05/02/2018 1354   CALCIUM 8.9 06/03/2017 1404   ALKPHOS 69 05/02/2018 1354   ALKPHOS 91 06/03/2017 1404   AST 20 05/02/2018 1354   AST 20 06/03/2017 1404   ALT 13 05/02/2018 1354   ALT 12 06/03/2017 1404   BILITOT 0.5 05/02/2018 1354   BILITOT 0.48 06/03/2017 1404       STUDIES:   ASSESSMENT: 84 y.o.  [place] man with   (1)  a history of essential thrombocytosis initially diagnosed December 2003,  controlled on anagrelide 0.5 mg daily and aspirin 81 mg daily. (stopped 06/2017)  (a) no evidence of leukemic transformation  (b) Hydrea 527m daily starting in 07/2017)  (2) anemia, mostly secondary to chronic kidney disease, receiving aranesp 3086m every 3 weeks  (3) possible transition to myelofibrosis on bone marrow biopsy 06/22/2017,  (a) normal cytogenetics  (b) FISH   PLAN:  Dicky is doing well today.  His platelets remain stable.  He is tolerating the Hydrea daily well and will continue this.  His hemoglobin is 11.2 today so he does not need an injection today.  He is happy with this news.    He will continue to be seen every 3 weeks for labs and an injection, with f/u with Dr. MaJana Hakimn 9 weeks.     A total of (20) minutes of face-to-face time was spent with this patient with greater than 50% of that time in counseling and care-coordination.    LiWilber BihariNP 05/23/18 2:37 PM Medical Oncology and Hematology [facility]0[place] [id number]. [phone]  Fax. [phone]

## 2018-05-23 NOTE — Telephone Encounter (Signed)
Gave avs and calendar ° °

## 2018-05-27 ENCOUNTER — Other Ambulatory Visit: Payer: Self-pay | Admitting: Medical

## 2018-06-13 ENCOUNTER — Inpatient Hospital Stay: Payer: Medicare Other

## 2018-06-13 ENCOUNTER — Other Ambulatory Visit: Payer: Self-pay | Admitting: Medical

## 2018-06-13 VITALS — BP 134/62 | HR 81 | Temp 97.5°F

## 2018-06-13 DIAGNOSIS — Z87891 Personal history of nicotine dependence: Secondary | ICD-10-CM | POA: Diagnosis not present

## 2018-06-13 DIAGNOSIS — R161 Splenomegaly, not elsewhere classified: Secondary | ICD-10-CM

## 2018-06-13 DIAGNOSIS — D473 Essential (hemorrhagic) thrombocythemia: Secondary | ICD-10-CM | POA: Diagnosis not present

## 2018-06-13 DIAGNOSIS — N183 Chronic kidney disease, stage 3 unspecified: Secondary | ICD-10-CM

## 2018-06-13 DIAGNOSIS — D631 Anemia in chronic kidney disease: Secondary | ICD-10-CM

## 2018-06-13 DIAGNOSIS — Z7982 Long term (current) use of aspirin: Secondary | ICD-10-CM | POA: Diagnosis not present

## 2018-06-13 DIAGNOSIS — N189 Chronic kidney disease, unspecified: Secondary | ICD-10-CM | POA: Diagnosis not present

## 2018-06-13 DIAGNOSIS — D649 Anemia, unspecified: Secondary | ICD-10-CM | POA: Diagnosis not present

## 2018-06-13 LAB — CBC WITH DIFFERENTIAL/PLATELET
Abs Immature Granulocytes: 1.18 10*3/uL — ABNORMAL HIGH (ref 0.00–0.07)
Basophils Absolute: 0.2 10*3/uL — ABNORMAL HIGH (ref 0.0–0.1)
Basophils Relative: 1 %
Eosinophils Absolute: 0 10*3/uL (ref 0.0–0.5)
Eosinophils Relative: 0 %
HCT: 31.6 % — ABNORMAL LOW (ref 39.0–52.0)
Hemoglobin: 10 g/dL — ABNORMAL LOW (ref 13.0–17.0)
IMMATURE GRANULOCYTES: 7 %
Lymphocytes Relative: 16 %
Lymphs Abs: 2.7 10*3/uL (ref 0.7–4.0)
MCH: 35.7 pg — ABNORMAL HIGH (ref 26.0–34.0)
MCHC: 31.6 g/dL (ref 30.0–36.0)
MCV: 112.9 fL — ABNORMAL HIGH (ref 80.0–100.0)
Monocytes Absolute: 1.8 10*3/uL — ABNORMAL HIGH (ref 0.1–1.0)
Monocytes Relative: 11 %
NEUTROS PCT: 65 %
Neutro Abs: 10.7 10*3/uL — ABNORMAL HIGH (ref 1.7–7.7)
PLATELETS: 376 10*3/uL (ref 150–400)
RBC: 2.8 MIL/uL — ABNORMAL LOW (ref 4.22–5.81)
RDW: 16.7 % — ABNORMAL HIGH (ref 11.5–15.5)
WBC: 16.6 10*3/uL — ABNORMAL HIGH (ref 4.0–10.5)
nRBC: 0.4 % — ABNORMAL HIGH (ref 0.0–0.2)

## 2018-06-13 LAB — COMPREHENSIVE METABOLIC PANEL
ALT: 16 U/L (ref 0–44)
AST: 23 U/L (ref 15–41)
Albumin: 3.7 g/dL (ref 3.5–5.0)
Alkaline Phosphatase: 76 U/L (ref 38–126)
Anion gap: 8 (ref 5–15)
BUN: 29 mg/dL — ABNORMAL HIGH (ref 8–23)
CHLORIDE: 109 mmol/L (ref 98–111)
CO2: 26 mmol/L (ref 22–32)
Calcium: 8.9 mg/dL (ref 8.9–10.3)
Creatinine, Ser: 1.66 mg/dL — ABNORMAL HIGH (ref 0.61–1.24)
GFR calc Af Amer: 42 mL/min — ABNORMAL LOW (ref >60)
GFR calc non Af Amer: 36 mL/min — ABNORMAL LOW (ref >60)
Glucose, Bld: 89 mg/dL (ref 70–99)
POTASSIUM: 4.6 mmol/L (ref 3.5–5.1)
Sodium: 143 mmol/L (ref 135–145)
Total Bilirubin: 0.6 mg/dL (ref 0.3–1.2)
Total Protein: 7.2 g/dL (ref 6.5–8.1)

## 2018-06-13 LAB — FERRITIN: Ferritin: 299 ng/mL (ref 24–336)

## 2018-06-13 MED ORDER — DARBEPOETIN ALFA 300 MCG/0.6ML IJ SOSY
300.0000 ug | PREFILLED_SYRINGE | Freq: Once | INTRAMUSCULAR | Status: AC
  Administered 2018-06-13: 300 ug via SUBCUTANEOUS

## 2018-06-13 MED ORDER — DARBEPOETIN ALFA 300 MCG/0.6ML IJ SOSY
PREFILLED_SYRINGE | INTRAMUSCULAR | Status: AC
  Filled 2018-06-13: qty 0.6

## 2018-06-13 MED ORDER — DARBEPOETIN ALFA 200 MCG/0.4ML IJ SOSY: 300.0000 ug | PREFILLED_SYRINGE | Freq: Once | INTRAMUSCULAR | Status: DC

## 2018-06-13 NOTE — Patient Instructions (Signed)

## 2018-06-15 NOTE — Telephone Encounter (Signed)
Is this ok to refill?  

## 2018-06-19 DIAGNOSIS — H353221 Exudative age-related macular degeneration, left eye, with active choroidal neovascularization: Secondary | ICD-10-CM | POA: Diagnosis not present

## 2018-06-23 DIAGNOSIS — H44002 Unspecified purulent endophthalmitis, left eye: Secondary | ICD-10-CM | POA: Diagnosis not present

## 2018-06-25 DIAGNOSIS — H44002 Unspecified purulent endophthalmitis, left eye: Secondary | ICD-10-CM | POA: Diagnosis not present

## 2018-06-26 DIAGNOSIS — H43392 Other vitreous opacities, left eye: Secondary | ICD-10-CM | POA: Diagnosis not present

## 2018-06-26 DIAGNOSIS — H25812 Combined forms of age-related cataract, left eye: Secondary | ICD-10-CM | POA: Diagnosis not present

## 2018-06-26 DIAGNOSIS — H44002 Unspecified purulent endophthalmitis, left eye: Secondary | ICD-10-CM | POA: Diagnosis not present

## 2018-06-26 DIAGNOSIS — H5712 Ocular pain, left eye: Secondary | ICD-10-CM | POA: Diagnosis not present

## 2018-06-26 DIAGNOSIS — H53132 Sudden visual loss, left eye: Secondary | ICD-10-CM | POA: Diagnosis not present

## 2018-06-26 DIAGNOSIS — Z961 Presence of intraocular lens: Secondary | ICD-10-CM | POA: Diagnosis not present

## 2018-06-26 DIAGNOSIS — H353111 Nonexudative age-related macular degeneration, right eye, early dry stage: Secondary | ICD-10-CM | POA: Diagnosis not present

## 2018-06-27 DIAGNOSIS — H43392 Other vitreous opacities, left eye: Secondary | ICD-10-CM | POA: Diagnosis not present

## 2018-06-27 DIAGNOSIS — H20052 Hypopyon, left eye: Secondary | ICD-10-CM | POA: Diagnosis not present

## 2018-06-27 DIAGNOSIS — H44002 Unspecified purulent endophthalmitis, left eye: Secondary | ICD-10-CM | POA: Diagnosis not present

## 2018-07-04 ENCOUNTER — Inpatient Hospital Stay: Payer: Medicare Other

## 2018-07-04 ENCOUNTER — Inpatient Hospital Stay: Payer: Medicare Other | Attending: Oncology

## 2018-07-04 VITALS — BP 121/57 | HR 78 | Temp 97.9°F | Resp 18

## 2018-07-04 DIAGNOSIS — N183 Chronic kidney disease, stage 3 unspecified: Secondary | ICD-10-CM

## 2018-07-04 DIAGNOSIS — D473 Essential (hemorrhagic) thrombocythemia: Secondary | ICD-10-CM

## 2018-07-04 DIAGNOSIS — D631 Anemia in chronic kidney disease: Secondary | ICD-10-CM

## 2018-07-04 DIAGNOSIS — R161 Splenomegaly, not elsewhere classified: Secondary | ICD-10-CM

## 2018-07-04 LAB — COMPREHENSIVE METABOLIC PANEL
ALT: 19 U/L (ref 0–44)
AST: 14 U/L — AB (ref 15–41)
Albumin: 3.6 g/dL (ref 3.5–5.0)
Alkaline Phosphatase: 58 U/L (ref 38–126)
Anion gap: 10 (ref 5–15)
BUN: 30 mg/dL — ABNORMAL HIGH (ref 8–23)
CO2: 24 mmol/L (ref 22–32)
Calcium: 8.3 mg/dL — ABNORMAL LOW (ref 8.9–10.3)
Chloride: 108 mmol/L (ref 98–111)
Creatinine, Ser: 1.58 mg/dL — ABNORMAL HIGH (ref 0.61–1.24)
GFR calc Af Amer: 45 mL/min — ABNORMAL LOW (ref >60)
GFR calc non Af Amer: 38 mL/min — ABNORMAL LOW (ref >60)
Glucose, Bld: 116 mg/dL — ABNORMAL HIGH (ref 70–99)
Potassium: 4.3 mmol/L (ref 3.5–5.1)
Sodium: 142 mmol/L (ref 135–145)
Total Bilirubin: 0.7 mg/dL (ref 0.3–1.2)
Total Protein: 6.6 g/dL (ref 6.5–8.1)

## 2018-07-04 LAB — CBC WITH DIFFERENTIAL/PLATELET
Abs Immature Granulocytes: 0.6 10*3/uL — ABNORMAL HIGH (ref 0.00–0.07)
Basophils Absolute: 0.1 10*3/uL (ref 0.0–0.1)
Basophils Relative: 0 %
Eosinophils Absolute: 0 10*3/uL (ref 0.0–0.5)
Eosinophils Relative: 0 %
HCT: 36.1 % — ABNORMAL LOW (ref 39.0–52.0)
HEMOGLOBIN: 11.3 g/dL — AB (ref 13.0–17.0)
Immature Granulocytes: 4 %
LYMPHS PCT: 13 %
Lymphs Abs: 2.2 10*3/uL (ref 0.7–4.0)
MCH: 35.8 pg — ABNORMAL HIGH (ref 26.0–34.0)
MCHC: 31.3 g/dL (ref 30.0–36.0)
MCV: 114.2 fL — ABNORMAL HIGH (ref 80.0–100.0)
Monocytes Absolute: 1.6 10*3/uL — ABNORMAL HIGH (ref 0.1–1.0)
Monocytes Relative: 10 %
NEUTROS ABS: 12.5 10*3/uL — AB (ref 1.7–7.7)
Neutrophils Relative %: 73 %
Platelets: 407 10*3/uL — ABNORMAL HIGH (ref 150–400)
RBC: 3.16 MIL/uL — ABNORMAL LOW (ref 4.22–5.81)
RDW: 17.3 % — ABNORMAL HIGH (ref 11.5–15.5)
WBC: 17 10*3/uL — ABNORMAL HIGH (ref 4.0–10.5)
nRBC: 0.1 % (ref 0.0–0.2)

## 2018-07-04 MED ORDER — DARBEPOETIN ALFA 300 MCG/0.6ML IJ SOSY: 300.0000 ug | PREFILLED_SYRINGE | Freq: Once | INTRAMUSCULAR | Status: DC

## 2018-07-04 MED ORDER — DARBEPOETIN ALFA 300 MCG/0.6ML IJ SOSY
PREFILLED_SYRINGE | INTRAMUSCULAR | Status: AC
  Filled 2018-07-04: qty 0.6

## 2018-07-04 NOTE — Progress Notes (Signed)
HGB 11.3 today no injection given.

## 2018-07-13 DIAGNOSIS — H353221 Exudative age-related macular degeneration, left eye, with active choroidal neovascularization: Secondary | ICD-10-CM | POA: Diagnosis not present

## 2018-07-13 DIAGNOSIS — H353111 Nonexudative age-related macular degeneration, right eye, early dry stage: Secondary | ICD-10-CM | POA: Diagnosis not present

## 2018-07-13 DIAGNOSIS — H35363 Drusen (degenerative) of macula, bilateral: Secondary | ICD-10-CM | POA: Diagnosis not present

## 2018-07-13 DIAGNOSIS — H43392 Other vitreous opacities, left eye: Secondary | ICD-10-CM | POA: Diagnosis not present

## 2018-07-21 NOTE — Progress Notes (Signed)
Nicholas [name]  [phone] [phone]   ID: Nicholas [name]   DOB: [birth year]  MR#: [id number]  IWL#:[id number]  Patient Care Team: Denita Lung, MD as PCP - General (Family Medicine) Carlos Quackenbush, Virgie Dad, MD as Consulting Physician (Oncology) Tysinger, Camelia Eng, PA-C as Physician Assistant (Family Medicine)  OTHERS: Dr. Posey Pronto (eye doctor)   CHIEF COMPLAINT:  Essential thrombocytosis and chronic anemia  CURRENT TREATMENT: hydroxyurea; Aranesp   INTERVAL HISTORY: Nicholas [name] returns today for follow-up and treatment of his myeloproliferative disorder. He is accompanied by his daughter, [name].  He continues on hydroxyurea.  His platelet count is generally well controlled.  He has some itching which may or may not be related to the medication.  He has no other side effects from it.   He also continues on Aranesp.  He has been receiving this every other time or 2 times out of 3 when we tested.  The other times his hemoglobin has been greater than 11.  Since his last visit here, he has had some eye surgery for an eye infection.  He does appear to be recovering from that.   REVIEW OF SYSTEMS: Nicholas [name] recently had a severe eye infection on his left, which led to outpatient surgery. The doctors at [facility] wanted to take his eye, but they ended up giving him anti-biotic shots into the eye until his usual doctor was back in town. He had some diarrhea the last few nights, but he says he may have eaten something unusual. The patient denies unusual headaches, visual changes, nausea, vomiting, or dizziness. There has been no unusual cough, phlegm production, or pleurisy. This been no change in bowel or bladder habits. The patient denies unexplained fatigue or unexplained weight loss, bleeding, rash, or fever. A detailed review of systems was otherwise noncontributory.    PAST MEDICAL HISTORY: Past Medical History:  Diagnosis Date  . Abnormal CT scan, sinus  10/04/06   pansinusitis, mildly deviated septum  . Allergy   . Anemia    related to CKD  . Chronic sinusitis   . Dysthymia    dysthymia  . H/O echocardiogram 01/04/2007   LV function normal, 55-60% EF, mildly increased thickenss of LV wall  . H/O echocardiogram 12/2006   mild LV wall thickening, EF 55-60%, LV function normal  . History of MRI of brain and brain stem 02/26/06   sinusitis, nonspecific deep white matter changes normal for age  . Hypertension   . Seborrheic keratosis    sees Toni Arthurs, NP dermatology  . Thrombocytosis ([place])    sees hematology    PAST SURGICAL HISTORY: Past Surgical History:  Procedure Laterality Date  . HERNIA REPAIR     right inguinal repair  . I&D EXTREMITY Right 09/16/2017   Procedure: IRRIGATION AND DEBRIDEMENT EXTREMITY;  Surgeon: Roseanne Kaufman, MD;  Location: [place];  Service: Orthopedics;  Laterality: Right;  . TONSILLECTOMY     childhood  . VARICOSE VEIN SURGERY     right lower leg    FAMILY HISTORY Family History  Problem Relation Age of Onset  . Heart disease Father   . Stroke Father   . Cancer Neg Hx   . COPD Neg Hx   . Diabetes Neg Hx     SOCIAL HISTORY:  (Reviewed 11/28/2013) The patient lives with his wife, who suffers from diabetes and emphysema and has had some small strokes. She is able to care for herself. He gives her her insulin shots.  He has 2 daughters, [name] [place] and Geddes who lives near the [employer] and works with Dr Redmond School .The patient has 3 grandchildren.[name]   ADVANCED DIRECTIVES: the patient tells me his daughter Nicholas [name] would make health care decisions if he is incapacitated    HEALTH MAINTENANCE: (Updated 11/28/2013) Social History   Tobacco Use  . Smoking status: Former Smoker    Last attempt to quit: 06/14/1958    Years since quitting: 60.1  . Smokeless tobacco: Never Used  Substance Use Topics  . Alcohol use: No  . Drug use: No     Colonoscopy:  Not on  file  PSA: Not on file  Bone density: Never  Lipid panel: October 2014  Allergies  Allergen Reactions  . Tape Other (See Comments)    The patient's SKIN IS VERY THIN AND TEARS AND BRUISES VERY EASILY!!  . Erythromycin Other (See Comments)    Gums turned red and bled profusely    Current Outpatient Medications  Medication Sig Dispense Refill  . acetaminophen (TYLENOL) 650 MG CR tablet Take 650 mg by mouth at bedtime.    [name] aspirin 81 MG tablet Take 1 tablet (81 mg total) by mouth daily. 90 tablet 3  . cetirizine (ZYRTEC) 10 MG tablet Take 10 mg by mouth at bedtime.    . [facility] D3 25 MCG (1000 UT) capsule TAKE 1 CAPSULE BY MOUTH EVERY DAY 90 capsule 3  . FLUoxetine (PROZAC) 10 MG tablet Take 1 tablet (10 mg total) by mouth daily. 90 tablet 0  . FLUoxetine (PROZAC) 40 MG capsule TAKE 1 CAPSULE BY MOUTH EVERY DAY 90 capsule 0  . hydroxyurea (HYDREA) 500 MG capsule Take 1 capsule (500 mg total) by mouth daily. May take with food to minimize GI side effects. 90 capsule 3  . ibuprofen (ADVIL,MOTRIN) 400 MG tablet Take 1 tablet (400 mg total) by mouth every 6 (six) hours as needed for fever, headache or mild pain. 30 tablet 0  . Multiple Vitamins-Minerals (PRESERVISION AREDS 2) CAPS Take 1 capsule by mouth 2 (two) times daily.    [name] saccharomyces boulardii (FLORASTOR) 250 MG capsule Take 1 capsule (250 mg total) by mouth 2 (two) times daily.    . sodium chloride (OCEAN) 0.65 % nasal spray Place 1 spray into the nose 4 (four) times daily as needed for congestion.     [name] terazosin (HYTRIN) 2 MG capsule TAKE 1 CAPSULE (2 MG TOTAL) BY MOUTH AT BEDTIME. 90 capsule 3   No current facility-administered medications for this visit.     OBJECTIVE: Older white man who appears stated age  48:   07/25/18 1440  BP: (!) 122/53  Pulse: 87  Resp: 18  Temp: 98.4 F (36.9 C)  SpO2: 94%     Body mass index is 21.88 kg/m.    ECOG FS: 1 Filed Weights   07/25/18 1440  Weight: 170 lb 6.4 oz (77.3 kg)    Sclerae unicteric, left eye slightly inflamed No cervical or supraclavicular adenopathy Lungs no rales or rhonchi Heart regular rate and rhythm Abd soft, nontender, positive bowel sounds MSK no focal spinal tenderness, no upper extremity lymphedema Neuro: nonfocal, well oriented, appropriate affect     LAB RESULTS:   Lab Results  Component Value Date   WBC 19.4 (H) 07/25/2018   NEUTROABS PENDING 07/25/2018   HGB 10.8 (L) 07/25/2018   HCT 33.1 (L) 07/25/2018   MCV 111.1 (H) 07/25/2018   PLT 434 (H)  07/25/2018      Chemistry      Component Value Date/Time   NA 142 07/25/2018 1422   NA 142 06/03/2017 1404   K 4.5 07/25/2018 1422   K 3.9 06/03/2017 1404   CL 107 07/25/2018 1422   CO2 25 07/25/2018 1422   CO2 26 06/03/2017 1404   BUN 30 (H) 07/25/2018 1422   BUN 19.9 06/03/2017 1404   CREATININE 1.70 (H) 07/25/2018 1422   CREATININE 1.6 (H) 06/03/2017 1404      Component Value Date/Time   CALCIUM 9.2 07/25/2018 1422   CALCIUM 8.9 06/03/2017 1404   ALKPHOS 85 07/25/2018 1422   ALKPHOS 91 06/03/2017 1404   AST 18 07/25/2018 1422   AST 20 06/03/2017 1404   ALT 11 07/25/2018 1422   ALT 12 06/03/2017 1404   BILITOT 0.5 07/25/2018 1422   BILITOT 0.48 06/03/2017 1404       STUDIES: No results found.    ASSESSMENT: 85 y.o.  Nicholas [name] man with   (1) essential thrombocytosis initially diagnosed December 2003,  controlled on anagrelide 0.5 mg daily and aspirin 81 mg daily initially, stopped 06/2017  (a) no evidence of leukemic transformation  (b) Hydrea 533m daily starting in 07/2017  (2) anemia, secondary to chronic kidney disease, receiving aranesp 3035m every 4 weeks  (3) possible transition to myelofibrosis on bone marrow biopsy 06/22/2017,  (a) normal cytogenetics  (b) FISH WNL   PLAN: Hansel is doing fine from my point of view.  He is maintaining a hemoglobin that allows him to function optimally given his other medical problems and age.  His  platelet count is controlled.  His white cell count has risen some and we will need to monitor that.  At this point I think it would be just as well to check his hemoglobin every 4 weeks instead of 3 since he has been missing some of the shots because of stable hemoglobins.  He is much in favor of the small change  He will see [place] in 6 months.  He knows to call for any other problem that may develop before that visit.    Gulianna Hornsby, GuVirgie DadMD  07/25/18 3:01 PM [facility] [address]. [phone]  Fax. [phone], AmJacqualyn Poseym acting as a scEducation administratoror GuChauncey CruelMD.   I, GuLurline DelD, have reviewed the above documentation for accuracy and completeness, and I agree with the above.

## 2018-07-22 ENCOUNTER — Other Ambulatory Visit: Payer: Self-pay | Admitting: Oncology

## 2018-07-25 ENCOUNTER — Inpatient Hospital Stay: Payer: Medicare Other

## 2018-07-25 ENCOUNTER — Inpatient Hospital Stay: Payer: Medicare Other | Attending: Oncology

## 2018-07-25 ENCOUNTER — Inpatient Hospital Stay (HOSPITAL_BASED_OUTPATIENT_CLINIC_OR_DEPARTMENT_OTHER): Payer: Medicare Other | Admitting: Oncology

## 2018-07-25 VITALS — BP 122/53 | HR 87 | Temp 98.4°F | Resp 18 | Ht 74.0 in | Wt 170.4 lb

## 2018-07-25 DIAGNOSIS — Z87891 Personal history of nicotine dependence: Secondary | ICD-10-CM | POA: Diagnosis not present

## 2018-07-25 DIAGNOSIS — N183 Chronic kidney disease, stage 3 unspecified: Secondary | ICD-10-CM

## 2018-07-25 DIAGNOSIS — D631 Anemia in chronic kidney disease: Secondary | ICD-10-CM | POA: Diagnosis not present

## 2018-07-25 DIAGNOSIS — D72829 Elevated white blood cell count, unspecified: Secondary | ICD-10-CM | POA: Insufficient documentation

## 2018-07-25 DIAGNOSIS — D473 Essential (hemorrhagic) thrombocythemia: Secondary | ICD-10-CM

## 2018-07-25 DIAGNOSIS — N189 Chronic kidney disease, unspecified: Secondary | ICD-10-CM | POA: Diagnosis not present

## 2018-07-25 DIAGNOSIS — R161 Splenomegaly, not elsewhere classified: Secondary | ICD-10-CM

## 2018-07-25 LAB — CBC WITH DIFFERENTIAL/PLATELET
Abs Immature Granulocytes: 1.41 10*3/uL — ABNORMAL HIGH (ref 0.00–0.07)
Basophils Absolute: 0.3 10*3/uL — ABNORMAL HIGH (ref 0.0–0.1)
Basophils Relative: 1 %
Eosinophils Absolute: 0 10*3/uL (ref 0.0–0.5)
Eosinophils Relative: 0 %
HEMATOCRIT: 33.1 % — AB (ref 39.0–52.0)
Hemoglobin: 10.8 g/dL — ABNORMAL LOW (ref 13.0–17.0)
Immature Granulocytes: 7 %
LYMPHS ABS: 2.2 10*3/uL (ref 0.7–4.0)
Lymphocytes Relative: 11 %
MCH: 36.2 pg — ABNORMAL HIGH (ref 26.0–34.0)
MCHC: 32.6 g/dL (ref 30.0–36.0)
MCV: 111.1 fL — ABNORMAL HIGH (ref 80.0–100.0)
MONO ABS: 2.1 10*3/uL — AB (ref 0.1–1.0)
Monocytes Relative: 11 %
Neutro Abs: 13.5 10*3/uL — ABNORMAL HIGH (ref 1.7–7.7)
Neutrophils Relative %: 70 %
Platelets: 434 10*3/uL — ABNORMAL HIGH (ref 150–400)
RBC: 2.98 MIL/uL — ABNORMAL LOW (ref 4.22–5.81)
RDW: 16.6 % — ABNORMAL HIGH (ref 11.5–15.5)
WBC: 19.4 10*3/uL — ABNORMAL HIGH (ref 4.0–10.5)
nRBC: 0.1 % (ref 0.0–0.2)

## 2018-07-25 LAB — COMPREHENSIVE METABOLIC PANEL
ALT: 11 U/L (ref 0–44)
AST: 18 U/L (ref 15–41)
Albumin: 3.7 g/dL (ref 3.5–5.0)
Alkaline Phosphatase: 85 U/L (ref 38–126)
Anion gap: 10 (ref 5–15)
BUN: 30 mg/dL — ABNORMAL HIGH (ref 8–23)
CO2: 25 mmol/L (ref 22–32)
Calcium: 9.2 mg/dL (ref 8.9–10.3)
Chloride: 107 mmol/L (ref 98–111)
Creatinine, Ser: 1.7 mg/dL — ABNORMAL HIGH (ref 0.61–1.24)
GFR calc Af Amer: 41 mL/min — ABNORMAL LOW (ref >60)
GFR calc non Af Amer: 35 mL/min — ABNORMAL LOW (ref >60)
Glucose, Bld: 122 mg/dL — ABNORMAL HIGH (ref 70–99)
Potassium: 4.5 mmol/L (ref 3.5–5.1)
Sodium: 142 mmol/L (ref 135–145)
Total Bilirubin: 0.5 mg/dL (ref 0.3–1.2)
Total Protein: 7.3 g/dL (ref 6.5–8.1)

## 2018-07-25 LAB — FERRITIN: Ferritin: 354 ng/mL — ABNORMAL HIGH (ref 24–336)

## 2018-07-25 MED ORDER — HYDROXYUREA 500 MG PO CAPS: 500.0000 mg | ORAL_CAPSULE | Freq: Every day | ORAL | 3 refills | Status: DC

## 2018-07-25 MED ORDER — DARBEPOETIN ALFA 300 MCG/0.6ML IJ SOSY
300.0000 ug | PREFILLED_SYRINGE | Freq: Once | INTRAMUSCULAR | Status: AC
  Administered 2018-07-25: 300 ug via SUBCUTANEOUS

## 2018-07-25 MED ORDER — DARBEPOETIN ALFA 300 MCG/0.6ML IJ SOSY
PREFILLED_SYRINGE | INTRAMUSCULAR | Status: AC
  Filled 2018-07-25: qty 0.6

## 2018-08-10 DIAGNOSIS — H353111 Nonexudative age-related macular degeneration, right eye, early dry stage: Secondary | ICD-10-CM | POA: Diagnosis not present

## 2018-08-10 DIAGNOSIS — H353221 Exudative age-related macular degeneration, left eye, with active choroidal neovascularization: Secondary | ICD-10-CM | POA: Diagnosis not present

## 2018-08-10 DIAGNOSIS — H43392 Other vitreous opacities, left eye: Secondary | ICD-10-CM | POA: Diagnosis not present

## 2018-08-10 DIAGNOSIS — H25812 Combined forms of age-related cataract, left eye: Secondary | ICD-10-CM | POA: Diagnosis not present

## 2018-08-15 DIAGNOSIS — H43391 Other vitreous opacities, right eye: Secondary | ICD-10-CM | POA: Diagnosis not present

## 2018-08-15 DIAGNOSIS — H2511 Age-related nuclear cataract, right eye: Secondary | ICD-10-CM | POA: Diagnosis not present

## 2018-08-15 DIAGNOSIS — H43392 Other vitreous opacities, left eye: Secondary | ICD-10-CM | POA: Diagnosis not present

## 2018-08-15 DIAGNOSIS — H25812 Combined forms of age-related cataract, left eye: Secondary | ICD-10-CM | POA: Diagnosis not present

## 2018-08-22 ENCOUNTER — Inpatient Hospital Stay: Payer: Medicare Other | Attending: Oncology

## 2018-08-22 ENCOUNTER — Inpatient Hospital Stay: Payer: Medicare Other

## 2018-08-22 VITALS — BP 132/55 | HR 68 | Temp 98.0°F | Resp 18

## 2018-08-22 DIAGNOSIS — N183 Chronic kidney disease, stage 3 unspecified: Secondary | ICD-10-CM

## 2018-08-22 DIAGNOSIS — R161 Splenomegaly, not elsewhere classified: Secondary | ICD-10-CM

## 2018-08-22 DIAGNOSIS — D631 Anemia in chronic kidney disease: Secondary | ICD-10-CM

## 2018-08-22 DIAGNOSIS — D473 Essential (hemorrhagic) thrombocythemia: Secondary | ICD-10-CM

## 2018-08-22 DIAGNOSIS — N189 Chronic kidney disease, unspecified: Secondary | ICD-10-CM | POA: Diagnosis not present

## 2018-08-22 LAB — CBC WITH DIFFERENTIAL/PLATELET
Abs Immature Granulocytes: 0.92 10*3/uL — ABNORMAL HIGH (ref 0.00–0.07)
Basophils Absolute: 0.2 10*3/uL — ABNORMAL HIGH (ref 0.0–0.1)
Basophils Relative: 1 %
Eosinophils Absolute: 0 10*3/uL (ref 0.0–0.5)
Eosinophils Relative: 0 %
HCT: 32.5 % — ABNORMAL LOW (ref 39.0–52.0)
Hemoglobin: 9.8 g/dL — ABNORMAL LOW (ref 13.0–17.0)
Immature Granulocytes: 6 %
Lymphocytes Relative: 12 %
Lymphs Abs: 1.8 10*3/uL (ref 0.7–4.0)
MCH: 34.4 pg — ABNORMAL HIGH (ref 26.0–34.0)
MCHC: 30.2 g/dL (ref 30.0–36.0)
MCV: 114 fL — AB (ref 80.0–100.0)
Monocytes Absolute: 1.7 10*3/uL — ABNORMAL HIGH (ref 0.1–1.0)
Monocytes Relative: 11 %
Neutro Abs: 10.7 10*3/uL — ABNORMAL HIGH (ref 1.7–7.7)
Neutrophils Relative %: 70 %
Platelets: 388 10*3/uL (ref 150–400)
RBC: 2.85 MIL/uL — ABNORMAL LOW (ref 4.22–5.81)
RDW: 17.4 % — ABNORMAL HIGH (ref 11.5–15.5)
WBC: 15.2 10*3/uL — ABNORMAL HIGH (ref 4.0–10.5)
nRBC: 0.2 % (ref 0.0–0.2)

## 2018-08-22 LAB — COMPREHENSIVE METABOLIC PANEL
ALT: 13 U/L (ref 0–44)
AST: 16 U/L (ref 15–41)
Albumin: 3.5 g/dL (ref 3.5–5.0)
Alkaline Phosphatase: 75 U/L (ref 38–126)
Anion gap: 9 (ref 5–15)
BUN: 30 mg/dL — ABNORMAL HIGH (ref 8–23)
CO2: 25 mmol/L (ref 22–32)
CREATININE: 1.74 mg/dL — AB (ref 0.61–1.24)
Calcium: 8.7 mg/dL — ABNORMAL LOW (ref 8.9–10.3)
Chloride: 109 mmol/L (ref 98–111)
GFR, EST AFRICAN AMERICAN: 40 mL/min — AB (ref >60)
GFR, EST NON AFRICAN AMERICAN: 34 mL/min — AB (ref >60)
Glucose, Bld: 103 mg/dL — ABNORMAL HIGH (ref 70–99)
Potassium: 4.4 mmol/L (ref 3.5–5.1)
Sodium: 143 mmol/L (ref 135–145)
Total Bilirubin: 0.5 mg/dL (ref 0.3–1.2)
Total Protein: 6.8 g/dL (ref 6.5–8.1)

## 2018-08-22 MED ORDER — DARBEPOETIN ALFA 300 MCG/0.6ML IJ SOSY
PREFILLED_SYRINGE | INTRAMUSCULAR | Status: AC
  Filled 2018-08-22: qty 0.6

## 2018-08-22 MED ORDER — DARBEPOETIN ALFA 300 MCG/0.6ML IJ SOSY
300.0000 ug | PREFILLED_SYRINGE | Freq: Once | INTRAMUSCULAR | Status: AC
  Administered 2018-08-22: 300 ug via SUBCUTANEOUS

## 2018-08-23 DIAGNOSIS — T8522XA Displacement of intraocular lens, initial encounter: Secondary | ICD-10-CM | POA: Diagnosis not present

## 2018-08-23 DIAGNOSIS — Z961 Presence of intraocular lens: Secondary | ICD-10-CM | POA: Diagnosis not present

## 2018-08-23 DIAGNOSIS — H5319 Other subjective visual disturbances: Secondary | ICD-10-CM | POA: Diagnosis not present

## 2018-08-26 ENCOUNTER — Other Ambulatory Visit: Payer: Self-pay | Admitting: Medical

## 2018-08-28 NOTE — Telephone Encounter (Signed)
Is this ok to refill?  

## 2018-09-18 DIAGNOSIS — H353221 Exudative age-related macular degeneration, left eye, with active choroidal neovascularization: Secondary | ICD-10-CM | POA: Diagnosis not present

## 2018-09-18 DIAGNOSIS — H353111 Nonexudative age-related macular degeneration, right eye, early dry stage: Secondary | ICD-10-CM | POA: Diagnosis not present

## 2018-09-19 ENCOUNTER — Inpatient Hospital Stay: Payer: Medicare Other | Attending: Oncology

## 2018-09-19 ENCOUNTER — Other Ambulatory Visit: Payer: Self-pay

## 2018-09-19 ENCOUNTER — Inpatient Hospital Stay: Payer: Medicare Other

## 2018-09-19 VITALS — BP 128/58

## 2018-09-19 DIAGNOSIS — D631 Anemia in chronic kidney disease: Secondary | ICD-10-CM | POA: Diagnosis not present

## 2018-09-19 DIAGNOSIS — N183 Chronic kidney disease, stage 3 unspecified: Secondary | ICD-10-CM

## 2018-09-19 DIAGNOSIS — N189 Chronic kidney disease, unspecified: Secondary | ICD-10-CM | POA: Diagnosis not present

## 2018-09-19 DIAGNOSIS — D473 Essential (hemorrhagic) thrombocythemia: Secondary | ICD-10-CM

## 2018-09-19 DIAGNOSIS — R161 Splenomegaly, not elsewhere classified: Secondary | ICD-10-CM

## 2018-09-19 LAB — CBC WITH DIFFERENTIAL/PLATELET
Abs Immature Granulocytes: 0.87 10*3/uL — ABNORMAL HIGH (ref 0.00–0.07)
Basophils Absolute: 0.2 10*3/uL — ABNORMAL HIGH (ref 0.0–0.1)
Basophils Relative: 1 %
Eosinophils Absolute: 0 10*3/uL (ref 0.0–0.5)
Eosinophils Relative: 0 %
HCT: 33.4 % — ABNORMAL LOW (ref 39.0–52.0)
Hemoglobin: 10.3 g/dL — ABNORMAL LOW (ref 13.0–17.0)
Immature Granulocytes: 6 %
Lymphocytes Relative: 14 %
Lymphs Abs: 2.1 10*3/uL (ref 0.7–4.0)
MCH: 34.9 pg — ABNORMAL HIGH (ref 26.0–34.0)
MCHC: 30.8 g/dL (ref 30.0–36.0)
MCV: 113.2 fL — ABNORMAL HIGH (ref 80.0–100.0)
Monocytes Absolute: 1.5 10*3/uL — ABNORMAL HIGH (ref 0.1–1.0)
Monocytes Relative: 10 %
Neutro Abs: 10 10*3/uL — ABNORMAL HIGH (ref 1.7–7.7)
Neutrophils Relative %: 69 %
Platelets: 426 10*3/uL — ABNORMAL HIGH (ref 150–400)
RBC: 2.95 MIL/uL — ABNORMAL LOW (ref 4.22–5.81)
RDW: 17.2 % — ABNORMAL HIGH (ref 11.5–15.5)
WBC: 14.6 10*3/uL — ABNORMAL HIGH (ref 4.0–10.5)
nRBC: 0.2 % (ref 0.0–0.2)

## 2018-09-19 LAB — COMPREHENSIVE METABOLIC PANEL
ALT: 15 U/L (ref 0–44)
AST: 22 U/L (ref 15–41)
Albumin: 3.5 g/dL (ref 3.5–5.0)
Alkaline Phosphatase: 79 U/L (ref 38–126)
Anion gap: 9 (ref 5–15)
BUN: 33 mg/dL — ABNORMAL HIGH (ref 8–23)
CO2: 24 mmol/L (ref 22–32)
Calcium: 8.7 mg/dL — ABNORMAL LOW (ref 8.9–10.3)
Chloride: 108 mmol/L (ref 98–111)
Creatinine, Ser: 1.71 mg/dL — ABNORMAL HIGH (ref 0.61–1.24)
GFR calc Af Amer: 41 mL/min — ABNORMAL LOW (ref >60)
GFR calc non Af Amer: 35 mL/min — ABNORMAL LOW (ref >60)
Glucose, Bld: 113 mg/dL — ABNORMAL HIGH (ref 70–99)
Potassium: 4.6 mmol/L (ref 3.5–5.1)
Sodium: 141 mmol/L (ref 135–145)
Total Bilirubin: 0.5 mg/dL (ref 0.3–1.2)
Total Protein: 7.1 g/dL (ref 6.5–8.1)

## 2018-09-19 MED ORDER — DARBEPOETIN ALFA 300 MCG/0.6ML IJ SOSY
300.0000 ug | PREFILLED_SYRINGE | Freq: Once | INTRAMUSCULAR | Status: AC
  Administered 2018-09-19: 300 ug via SUBCUTANEOUS

## 2018-09-19 MED ORDER — DARBEPOETIN ALFA 300 MCG/0.6ML IJ SOSY
PREFILLED_SYRINGE | INTRAMUSCULAR | Status: AC
  Filled 2018-09-19: qty 0.6

## 2018-09-19 NOTE — Patient Instructions (Signed)

## 2018-09-20 LAB — FERRITIN: Ferritin: 278 ng/mL (ref 24–336)

## 2018-10-02 ENCOUNTER — Encounter (HOSPITAL_COMMUNITY): Payer: Self-pay | Admitting: Physician Assistant

## 2018-10-02 NOTE — Progress Notes (Signed)
Dr. Serita Grit office notified that patient's daughter stated that he did not want to come to surgery due to COVID-19. Per Fraser Din at Dr. Serita Grit office ok to remove from schedule.

## 2018-10-02 NOTE — Progress Notes (Signed)
Per the daughter Ivin Booty, the surgery will be postponed for 60 days, due to the pt being scared of the the Covid-19 pandemic.

## 2018-10-03 ENCOUNTER — Encounter (HOSPITAL_COMMUNITY): Admission: RE | Payer: Self-pay | Source: Home / Self Care

## 2018-10-03 ENCOUNTER — Ambulatory Visit (HOSPITAL_COMMUNITY): Admission: RE | Admit: 2018-10-03 | Payer: Medicare Other | Source: Home / Self Care | Admitting: Ophthalmology

## 2018-10-03 SURGERY — REMOVAL, IOL
Anesthesia: Monitor Anesthesia Care | Laterality: Left

## 2018-10-17 ENCOUNTER — Other Ambulatory Visit: Payer: Self-pay

## 2018-10-17 ENCOUNTER — Inpatient Hospital Stay: Payer: Medicare Other

## 2018-10-17 ENCOUNTER — Inpatient Hospital Stay: Payer: Medicare Other | Attending: Oncology

## 2018-10-17 VITALS — BP 125/52 | HR 73 | Temp 97.8°F | Resp 18

## 2018-10-17 DIAGNOSIS — N183 Chronic kidney disease, stage 3 unspecified: Secondary | ICD-10-CM

## 2018-10-17 DIAGNOSIS — D473 Essential (hemorrhagic) thrombocythemia: Secondary | ICD-10-CM

## 2018-10-17 DIAGNOSIS — D631 Anemia in chronic kidney disease: Secondary | ICD-10-CM

## 2018-10-17 DIAGNOSIS — N189 Chronic kidney disease, unspecified: Secondary | ICD-10-CM | POA: Diagnosis not present

## 2018-10-17 DIAGNOSIS — R161 Splenomegaly, not elsewhere classified: Secondary | ICD-10-CM

## 2018-10-17 LAB — CBC WITH DIFFERENTIAL/PLATELET
Abs Immature Granulocytes: 0.91 10*3/uL — ABNORMAL HIGH (ref 0.00–0.07)
Basophils Absolute: 0.2 10*3/uL — ABNORMAL HIGH (ref 0.0–0.1)
Basophils Relative: 2 %
Eosinophils Absolute: 0 10*3/uL (ref 0.0–0.5)
Eosinophils Relative: 0 %
HCT: 35.3 % — ABNORMAL LOW (ref 39.0–52.0)
Hemoglobin: 10.9 g/dL — ABNORMAL LOW (ref 13.0–17.0)
Immature Granulocytes: 7 %
Lymphocytes Relative: 17 %
Lymphs Abs: 2.2 10*3/uL (ref 0.7–4.0)
MCH: 34.8 pg — ABNORMAL HIGH (ref 26.0–34.0)
MCHC: 30.9 g/dL (ref 30.0–36.0)
MCV: 112.8 fL — ABNORMAL HIGH (ref 80.0–100.0)
Monocytes Absolute: 1.3 10*3/uL — ABNORMAL HIGH (ref 0.1–1.0)
Monocytes Relative: 10 %
Neutro Abs: 8.4 10*3/uL — ABNORMAL HIGH (ref 1.7–7.7)
Neutrophils Relative %: 64 %
Platelets: 421 10*3/uL — ABNORMAL HIGH (ref 150–400)
RBC: 3.13 MIL/uL — ABNORMAL LOW (ref 4.22–5.81)
RDW: 17.5 % — ABNORMAL HIGH (ref 11.5–15.5)
WBC: 13 10*3/uL — ABNORMAL HIGH (ref 4.0–10.5)
nRBC: 0.2 % (ref 0.0–0.2)

## 2018-10-17 LAB — CMP (CANCER CENTER ONLY)
ALT: 18 U/L (ref 0–44)
AST: 23 U/L (ref 15–41)
Albumin: 3.5 g/dL (ref 3.5–5.0)
Alkaline Phosphatase: 79 U/L (ref 38–126)
Anion gap: 10 (ref 5–15)
BUN: 34 mg/dL — ABNORMAL HIGH (ref 8–23)
CO2: 25 mmol/L (ref 22–32)
Calcium: 8.5 mg/dL — ABNORMAL LOW (ref 8.9–10.3)
Chloride: 108 mmol/L (ref 98–111)
Creatinine: 1.7 mg/dL — ABNORMAL HIGH (ref 0.61–1.24)
GFR, Est AFR Am: 41 mL/min — ABNORMAL LOW (ref >60)
GFR, Estimated: 35 mL/min — ABNORMAL LOW (ref >60)
Glucose, Bld: 110 mg/dL — ABNORMAL HIGH (ref 70–99)
Potassium: 4.2 mmol/L (ref 3.5–5.1)
Sodium: 143 mmol/L (ref 135–145)
Total Bilirubin: 0.5 mg/dL (ref 0.3–1.2)
Total Protein: 7 g/dL (ref 6.5–8.1)

## 2018-10-17 MED ORDER — DARBEPOETIN ALFA 300 MCG/0.6ML IJ SOSY
PREFILLED_SYRINGE | INTRAMUSCULAR | Status: AC
  Filled 2018-10-17: qty 0.6

## 2018-10-17 MED ORDER — DARBEPOETIN ALFA 300 MCG/0.6ML IJ SOSY
300.0000 ug | PREFILLED_SYRINGE | Freq: Once | INTRAMUSCULAR | Status: AC
  Administered 2018-10-17: 300 ug via SUBCUTANEOUS

## 2018-10-17 NOTE — Patient Instructions (Signed)

## 2018-10-17 NOTE — Progress Notes (Signed)
Hgb 10.9. Read to this nurse by med tech Verdis Frederickson. Results were read due to sunquest  system downtime

## 2018-10-26 ENCOUNTER — Other Ambulatory Visit: Payer: Self-pay

## 2018-10-26 ENCOUNTER — Encounter: Payer: Self-pay | Admitting: Family Medicine

## 2018-10-26 ENCOUNTER — Ambulatory Visit (INDEPENDENT_AMBULATORY_CARE_PROVIDER_SITE_OTHER): Payer: Medicare Other | Admitting: Family Medicine

## 2018-10-26 VITALS — Wt 174.0 lb

## 2018-10-26 DIAGNOSIS — S41112A Laceration without foreign body of left upper arm, initial encounter: Secondary | ICD-10-CM

## 2018-10-26 MED ORDER — DOXYCYCLINE HYCLATE 100 MG PO TABS: 100.0000 mg | ORAL_TABLET | Freq: Two times a day (BID) | ORAL | 0 refills | Status: DC

## 2018-10-26 NOTE — Progress Notes (Signed)
   Subjective:   Documentation for virtual audio and video telecommunications through [facility] encounter:  The patient was located at home. 2 patient identifiers used.  The provider was located in the office. The patient did consent to this visit and is aware of possible charges through their insurance for this visit.  The other persons participating in this telemedicine service were his daughters.     Patient ID: Nicholas Fabian., male    DOB: Nov 08, 1927, 85 y.o.   MRN: [id number]  HPI Chief Complaint  Patient presents with  . fell at home    fell at home last night @ 8pm- torn the skin in 10-12 inch area, back arm near elbow.    Daughter reports that patient lost his balance due to stepping over something in his living room and fell into the brick fireplace hitting the back of his right arm on the brick and tearing his skin. This occurred at 8pm last night. States the area bled quite a bit. Bleeding is controlled.  States they cleaned the area with hydrogen peroxide and saline. His granddaughter is a paramedic and did the cleaning and dressing.  Denies any other injuries. Denies hitting his head.   States he has normal sensation, ROM and grip strength of left arm and hand.   He is taking an 81 mg aspirin.   Denies fever, chills, dizziness, chest pain, palpitations, shortness of breath, abdominal pain, N/V.   Reviewed allergies, medications, past medical, surgical, family, and social history.     Review of Systems Pertinent positives and negatives in the history of present illness.     Objective:   Physical Exam Wt 174 lb (78.9 kg)   BMI 22.34 kg/m   Alert and oriented and in no acute distress.  Left posterior upper arm with linear skin tear, edges are jagged in appearance. Bleeding controlled. No pus or sign of infection currently. He is able to move the extremity without difficulty. No focal weakness.       Assessment & Plan:  Skin tear of left upper arm without  complication, initial encounter  Discussed limitations of virtual visit. No acute distress.  His daughter Nicholas [name] assisted with the visit and is currently caring for him.  He appears to be at baseline and left arm appears to be neurovascularly intact.  Tdap is UTD. His granddaughter who is a paramedic cleaned the area well last night.  Discussed keeping the edges as approximated as possible and using the skin flaps and a biological barrier.  He will try using a vasoline guaze from [facility]. Discussed wound care and the importance of checking his sensation and pulse after a new dressing is applied and regularly to make sure circulation is not compromised. We will put him on an antibiotic to cover for potential infection. They will follow up as needed with questions or concerns.   Time spent on call was 15 minutes and in review of previous records 1 minutes total.  This virtual service is not related to other E/M service within previous 7 days.  99441 (5-44min) 99442 (11-17min) 99443 (21-44min)

## 2018-11-14 ENCOUNTER — Inpatient Hospital Stay: Payer: Medicare Other

## 2018-11-14 ENCOUNTER — Other Ambulatory Visit: Payer: Self-pay

## 2018-11-14 ENCOUNTER — Inpatient Hospital Stay: Payer: Medicare Other | Attending: Oncology

## 2018-11-14 VITALS — BP 125/52 | HR 72 | Temp 98.2°F | Resp 17

## 2018-11-14 DIAGNOSIS — D473 Essential (hemorrhagic) thrombocythemia: Secondary | ICD-10-CM

## 2018-11-14 DIAGNOSIS — D631 Anemia in chronic kidney disease: Secondary | ICD-10-CM | POA: Diagnosis not present

## 2018-11-14 DIAGNOSIS — N183 Chronic kidney disease, stage 3 unspecified: Secondary | ICD-10-CM

## 2018-11-14 DIAGNOSIS — N189 Chronic kidney disease, unspecified: Secondary | ICD-10-CM | POA: Insufficient documentation

## 2018-11-14 DIAGNOSIS — R161 Splenomegaly, not elsewhere classified: Secondary | ICD-10-CM

## 2018-11-14 LAB — COMPREHENSIVE METABOLIC PANEL
ALT: 15 U/L (ref 0–44)
AST: 22 U/L (ref 15–41)
Albumin: 3.4 g/dL — ABNORMAL LOW (ref 3.5–5.0)
Alkaline Phosphatase: 74 U/L (ref 38–126)
Anion gap: 7 (ref 5–15)
BUN: 28 mg/dL — ABNORMAL HIGH (ref 8–23)
CO2: 27 mmol/L (ref 22–32)
Calcium: 8.6 mg/dL — ABNORMAL LOW (ref 8.9–10.3)
Chloride: 108 mmol/L (ref 98–111)
Creatinine, Ser: 1.72 mg/dL — ABNORMAL HIGH (ref 0.61–1.24)
GFR calc Af Amer: 40 mL/min — ABNORMAL LOW (ref >60)
GFR calc non Af Amer: 35 mL/min — ABNORMAL LOW (ref >60)
Glucose, Bld: 99 mg/dL (ref 70–99)
Potassium: 4.2 mmol/L (ref 3.5–5.1)
Sodium: 142 mmol/L (ref 135–145)
Total Bilirubin: 0.4 mg/dL (ref 0.3–1.2)
Total Protein: 6.6 g/dL (ref 6.5–8.1)

## 2018-11-14 LAB — CBC WITH DIFFERENTIAL/PLATELET
Abs Immature Granulocytes: 0.54 10*3/uL — ABNORMAL HIGH (ref 0.00–0.07)
Basophils Absolute: 0.2 10*3/uL — ABNORMAL HIGH (ref 0.0–0.1)
Basophils Relative: 2 %
Eosinophils Absolute: 0 10*3/uL (ref 0.0–0.5)
Eosinophils Relative: 0 %
HCT: 33.6 % — ABNORMAL LOW (ref 39.0–52.0)
Hemoglobin: 10.4 g/dL — ABNORMAL LOW (ref 13.0–17.0)
Immature Granulocytes: 5 %
Lymphocytes Relative: 20 %
Lymphs Abs: 2.2 10*3/uL (ref 0.7–4.0)
MCH: 34.8 pg — ABNORMAL HIGH (ref 26.0–34.0)
MCHC: 31 g/dL (ref 30.0–36.0)
MCV: 112.4 fL — ABNORMAL HIGH (ref 80.0–100.0)
Monocytes Absolute: 1.3 10*3/uL — ABNORMAL HIGH (ref 0.1–1.0)
Monocytes Relative: 11 %
Neutro Abs: 7 10*3/uL (ref 1.7–7.7)
Neutrophils Relative %: 62 %
Platelets: 363 10*3/uL (ref 150–400)
RBC: 2.99 MIL/uL — ABNORMAL LOW (ref 4.22–5.81)
RDW: 17.8 % — ABNORMAL HIGH (ref 11.5–15.5)
WBC: 11.1 10*3/uL — ABNORMAL HIGH (ref 4.0–10.5)
nRBC: 0.2 % (ref 0.0–0.2)

## 2018-11-14 MED ORDER — DARBEPOETIN ALFA 300 MCG/0.6ML IJ SOSY
300.0000 ug | PREFILLED_SYRINGE | Freq: Once | INTRAMUSCULAR | Status: AC
  Administered 2018-11-14: 300 ug via SUBCUTANEOUS

## 2018-11-30 ENCOUNTER — Other Ambulatory Visit: Payer: Self-pay | Admitting: Medical

## 2018-12-11 ENCOUNTER — Telehealth: Payer: Self-pay

## 2018-12-11 NOTE — Telephone Encounter (Signed)
Called and left voicemail regarding pre-screening questions for appt on 6/30 

## 2018-12-12 ENCOUNTER — Other Ambulatory Visit: Payer: Self-pay

## 2018-12-12 ENCOUNTER — Inpatient Hospital Stay: Payer: Medicare Other

## 2018-12-12 ENCOUNTER — Ambulatory Visit: Payer: Medicare Other

## 2018-12-12 VITALS — BP 127/56 | HR 88 | Temp 98.7°F | Resp 18

## 2018-12-12 DIAGNOSIS — D631 Anemia in chronic kidney disease: Secondary | ICD-10-CM | POA: Diagnosis not present

## 2018-12-12 DIAGNOSIS — N183 Chronic kidney disease, stage 3 unspecified: Secondary | ICD-10-CM

## 2018-12-12 DIAGNOSIS — D473 Essential (hemorrhagic) thrombocythemia: Secondary | ICD-10-CM

## 2018-12-12 DIAGNOSIS — R161 Splenomegaly, not elsewhere classified: Secondary | ICD-10-CM

## 2018-12-12 DIAGNOSIS — N189 Chronic kidney disease, unspecified: Secondary | ICD-10-CM | POA: Diagnosis not present

## 2018-12-12 LAB — CBC WITH DIFFERENTIAL/PLATELET
Abs Immature Granulocytes: 0.87 10*3/uL — ABNORMAL HIGH (ref 0.00–0.07)
Basophils Absolute: 0.2 10*3/uL — ABNORMAL HIGH (ref 0.0–0.1)
Basophils Relative: 1 %
Eosinophils Absolute: 0 10*3/uL (ref 0.0–0.5)
Eosinophils Relative: 0 %
HCT: 32.8 % — ABNORMAL LOW (ref 39.0–52.0)
Hemoglobin: 10.3 g/dL — ABNORMAL LOW (ref 13.0–17.0)
Immature Granulocytes: 6 %
Lymphocytes Relative: 13 %
Lymphs Abs: 1.8 10*3/uL (ref 0.7–4.0)
MCH: 35.2 pg — ABNORMAL HIGH (ref 26.0–34.0)
MCHC: 31.4 g/dL (ref 30.0–36.0)
MCV: 111.9 fL — ABNORMAL HIGH (ref 80.0–100.0)
Monocytes Absolute: 1.6 10*3/uL — ABNORMAL HIGH (ref 0.1–1.0)
Monocytes Relative: 11 %
Neutro Abs: 10.1 10*3/uL — ABNORMAL HIGH (ref 1.7–7.7)
Neutrophils Relative %: 69 %
Platelets: 409 10*3/uL — ABNORMAL HIGH (ref 150–400)
RBC: 2.93 MIL/uL — ABNORMAL LOW (ref 4.22–5.81)
RDW: 17.4 % — ABNORMAL HIGH (ref 11.5–15.5)
WBC: 14.6 10*3/uL — ABNORMAL HIGH (ref 4.0–10.5)
nRBC: 0.3 % — ABNORMAL HIGH (ref 0.0–0.2)

## 2018-12-12 MED ORDER — DARBEPOETIN ALFA 300 MCG/0.6ML IJ SOSY
300.0000 ug | PREFILLED_SYRINGE | Freq: Once | INTRAMUSCULAR | Status: AC
  Administered 2018-12-12: 300 ug via SUBCUTANEOUS

## 2018-12-12 MED ORDER — DARBEPOETIN ALFA 300 MCG/0.6ML IJ SOSY
PREFILLED_SYRINGE | INTRAMUSCULAR | Status: AC
  Filled 2018-12-12: qty 0.6

## 2018-12-12 NOTE — Patient Instructions (Signed)
Darbepoetin Alfa injection What is this medicine? DARBEPOETIN ALFA (dar be POE e tin AL fa) helps your body make more red blood cells. It is used to treat anemia caused by chronic kidney failure and chemotherapy. This medicine may be used for other purposes; ask your health care provider or pharmacist if you have questions. COMMON BRAND NAME(S): Aranesp What should I tell my health care provider before I take this medicine? They need to know if you have any of these conditions:  blood clotting disorders or history of blood clots  cancer patient not on chemotherapy  cystic fibrosis  heart disease, such as angina, heart failure, or a history of a heart attack  hemoglobin level of 12 g/dL or greater  high blood pressure  low levels of folate, iron, or vitamin B12  seizures  an unusual or allergic reaction to darbepoetin, erythropoietin, albumin, hamster proteins, latex, other medicines, foods, dyes, or preservatives  pregnant or trying to get pregnant  breast-feeding How should I use this medicine? This medicine is for injection into a vein or under the skin. It is usually given by a health care professional in a hospital or clinic setting. If you get this medicine at home, you will be taught how to prepare and give this medicine. Use exactly as directed. Take your medicine at regular intervals. Do not take your medicine more often than directed. It is important that you put your used needles and syringes in a special sharps container. Do not put them in a trash can. If you do not have a sharps container, call your pharmacist or healthcare provider to get one. A special MedGuide will be given to you by the pharmacist with each prescription and refill. Be sure to read this information carefully each time. Talk to your pediatrician regarding the use of this medicine in children. While this medicine may be used in children as young as 1 month of age for selected conditions, precautions do  apply. Overdosage: If you think you have taken too much of this medicine contact a poison control center or emergency room at once. NOTE: This medicine is only for you. Do not share this medicine with others. What if I miss a dose? If you miss a dose, take it as soon as you can. If it is almost time for your next dose, take only that dose. Do not take double or extra doses. What may interact with this medicine? Do not take this medicine with any of the following medications:  epoetin alfa This list may not describe all possible interactions. Give your health care provider a list of all the medicines, herbs, non-prescription drugs, or dietary supplements you use. Also tell them if you smoke, drink alcohol, or use illegal drugs. Some items may interact with your medicine. What should I watch for while using this medicine? Your condition will be monitored carefully while you are receiving this medicine. You may need blood work done while you are taking this medicine. This medicine may cause a decrease in vitamin B6. You should make sure that you get enough vitamin B6 while you are taking this medicine. Discuss the foods you eat and the vitamins you take with your health care professional. What side effects may I notice from receiving this medicine? Side effects that you should report to your doctor or health care professional as soon as possible:  allergic reactions like skin rash, itching or hives, swelling of the face, lips, or tongue  breathing problems  changes in   vision  chest pain  confusion, trouble speaking or understanding  feeling faint or lightheaded, falls  high blood pressure  muscle aches or pains  pain, swelling, warmth in the leg  rapid weight gain  severe headaches  sudden numbness or weakness of the face, arm or leg  trouble walking, dizziness, loss of balance or coordination  seizures (convulsions)  swelling of the ankles, feet, hands  unusually weak or  tired Side effects that usually do not require medical attention (report to your doctor or health care professional if they continue or are bothersome):  diarrhea  fever, chills (flu-like symptoms)  headaches  nausea, vomiting  redness, stinging, or swelling at site where injected This list may not describe all possible side effects. Call your doctor for medical advice about side effects. You may report side effects to FDA at 1-800-FDA-1088. Where should I keep my medicine? Keep out of the reach of children. Store in a refrigerator between 2 and 8 degrees C (36 and 46 degrees F). Do not freeze. Do not shake. Throw away any unused portion if using a single-dose vial. Throw away any unused medicine after the expiration date. NOTE: This sheet is a summary. It may not cover all possible information. If you have questions about this medicine, talk to your doctor, pharmacist, or health care provider.  2020 Elsevier/Gold Standard (2017-06-15 16:44:20)  

## 2018-12-20 ENCOUNTER — Other Ambulatory Visit: Payer: Self-pay | Admitting: Medical

## 2018-12-20 ENCOUNTER — Telehealth: Payer: Self-pay | Admitting: Medical

## 2018-12-20 ENCOUNTER — Telehealth: Payer: Self-pay | Admitting: General Practice

## 2018-12-20 DIAGNOSIS — R05 Cough: Secondary | ICD-10-CM

## 2018-12-20 DIAGNOSIS — Z20822 Contact with and (suspected) exposure to covid-19: Secondary | ICD-10-CM

## 2018-12-20 DIAGNOSIS — R5383 Other fatigue: Secondary | ICD-10-CM

## 2018-12-20 DIAGNOSIS — R531 Weakness: Secondary | ICD-10-CM

## 2018-12-20 DIAGNOSIS — R059 Cough, unspecified: Secondary | ICD-10-CM

## 2018-12-20 NOTE — Telephone Encounter (Signed)
Pt has been scheduled for covid testing.  ° °

## 2018-12-20 NOTE — Telephone Encounter (Signed)
I placed order for [name] center for covid test.  They should be calling Nicholas [name].  You can set him up with me for virtual consult if needed to discuss symptoms.   Certainly he should self quarantine and should hydrate, rest, watch for signs of worsening symptoms - fever, wheezing, extreme fatigue and weakness, SOB, dehydration.

## 2018-12-20 NOTE — Telephone Encounter (Signed)
[facility]12/2018. Pt started complaining of extreme weakness Saturday. Monday he developed a cough. Yesterday cough was significantly worse, becoming a very deep cough. Pt's daughter thinks pt needs to be tested for covid. Please call pt's daughter [name] at [phone].

## 2018-12-20 NOTE — Telephone Encounter (Signed)
Pt has been scheduled for covid testing.  Scheduled pt with his daughter.  Pt has been scheduled for [facility] testing location.  Pt was referred by: Carlena Hurl, PA-C

## 2018-12-20 NOTE — Addendum Note (Signed)
Addended by: Dimple Nanas on: 12/20/2018 03:55 PM   Modules accepted: Orders

## 2018-12-20 NOTE — Telephone Encounter (Signed)
Pt daughter called to schedule appt for Covid-19 testing. Cb# [phone]

## 2018-12-21 ENCOUNTER — Other Ambulatory Visit: Payer: Medicare Other

## 2018-12-21 DIAGNOSIS — Z20822 Contact with and (suspected) exposure to covid-19: Secondary | ICD-10-CM

## 2018-12-21 DIAGNOSIS — R6889 Other general symptoms and signs: Secondary | ICD-10-CM | POA: Diagnosis not present

## 2018-12-26 LAB — NOVEL CORONAVIRUS, NAA: SARS-CoV-2, NAA: NOT DETECTED

## 2018-12-27 ENCOUNTER — Other Ambulatory Visit: Payer: Self-pay

## 2018-12-27 ENCOUNTER — Ambulatory Visit (INDEPENDENT_AMBULATORY_CARE_PROVIDER_SITE_OTHER): Payer: Medicare Other | Admitting: Medical

## 2018-12-27 VITALS — BP 126/78 | HR 81 | Temp 98.0°F | Wt 171.2 lb

## 2018-12-27 DIAGNOSIS — L98499 Non-pressure chronic ulcer of skin of other sites with unspecified severity: Secondary | ICD-10-CM | POA: Diagnosis not present

## 2018-12-27 DIAGNOSIS — L089 Local infection of the skin and subcutaneous tissue, unspecified: Secondary | ICD-10-CM

## 2018-12-27 MED ORDER — CEPHALEXIN 500 MG PO CAPS: 500.0000 mg | ORAL_CAPSULE | Freq: Three times a day (TID) | ORAL | 0 refills | Status: DC

## 2018-12-27 MED ORDER — MUPIROCIN 2 % EX OINT: 1.0000 "application " | TOPICAL_OINTMENT | Freq: Three times a day (TID) | CUTANEOUS | 0 refills | Status: DC

## 2018-12-27 NOTE — Patient Instructions (Signed)
Currently the left lower leg is showing some signs of skin infection and an ulceration of left lower leg  There is redness around the ulceration that extends out about 2 inches.     Recommendations:  Begin Keflex antibiotic 500mg  tablet, 3 times daily for 10 days  Begin topical Mupirocin ointment 3 times daily  Keep the wound and skin clean with soap and water  You can put a gauze with paper tape or nonstick bandage with Coban over the ulceration  Change the dressing 1 or 2 times daily  Watch for signs of worsening/spreading redness, warmth, fever, chills, nausea, etc.     If not improving over the next 2-3 days, let Nicholas [name] know by Friday

## 2018-12-27 NOTE — Progress Notes (Signed)
Subjective: Chief Complaint  Patient presents with  . spot on ankle    spot on ankle for a week, red, oozie, covid- neg   Here today for spot on ankle.   Accompanied by daughter.  About a week ago his [place] puppy scratched his left lower leg, had lots of bleeding.  Ended up calling in and talking to Dr. Redmond School here.  EMS also came out to the house.  The bleeding was finally stopped.    In the last few days he has developed a wound in left lateral leg over ankle and now has some warmth and redness of left lower leg.  No fever, no body aches, no chills, no nausea, no vomiting  He does note the lower leg hurts.   Had a scab yesterday that he pulled off.   No other aggravating or relieving factors. No other complaint.    Past Medical History:  Diagnosis Date  . Abnormal CT scan, sinus 10/04/06   pansinusitis, mildly deviated septum  . Allergy   . Anemia    related to CKD  . Chronic sinusitis   . Dysthymia    dysthymia  . H/O echocardiogram 01/04/2007   LV function normal, 55-60% EF, mildly increased thickenss of LV wall  . H/O echocardiogram 12/2006   mild LV wall thickening, EF 55-60%, LV function normal  . History of MRI of brain and brain stem 02/26/06   sinusitis, nonspecific deep white matter changes normal for age  . Hypertension   . Seborrheic keratosis    sees Toni Arthurs, NP dermatology  . Thrombocytosis [facility])    sees hematology   Current Outpatient Medications on File Prior to Visit  Medication Sig Dispense Refill  . acetaminophen (TYLENOL) 650 MG CR tablet Take 650 mg by mouth at bedtime.    [name] aspirin 81 MG tablet Take 1 tablet (81 mg total) by mouth daily. 90 tablet 3  . cetirizine (ZYRTEC) 10 MG tablet Take 10 mg by mouth at bedtime.    . [facility]S D3 25 MCG (1000 UT) capsule TAKE 1 CAPSULE BY MOUTH EVERY DAY 90 capsule 3  . FLUoxetine (PROZAC) 40 MG capsule TAKE 1 CAPSULE BY MOUTH EVERY DAY 90 capsule 1  . hydroxyurea (HYDREA) 500 MG capsule Take 1 capsule (500 mg total)  by mouth daily. May take with food to minimize GI side effects. 90 capsule 3  . ibuprofen (ADVIL,MOTRIN) 400 MG tablet Take 1 tablet (400 mg total) by mouth every 6 (six) hours as needed for fever, headache or mild pain. 30 tablet 0  . saccharomyces boulardii (FLORASTOR) 250 MG capsule Take 1 capsule (250 mg total) by mouth 2 (two) times daily.    . sodium chloride (OCEAN) 0.65 % nasal spray Place 1 spray into the nose 4 (four) times daily as needed for congestion.     [name] terazosin (HYTRIN) 2 MG capsule TAKE 1 CAPSULE (2 MG TOTAL) BY MOUTH AT BEDTIME. 90 capsule 3   No current facility-administered medications on file prior to visit.    ROS as in subjective   Objective: BP 126/78   Pulse 81   Temp 98 F (36.7 C) (Oral)   Wt 171 lb 3.2 oz (77.7 kg)   BMI 21.98 kg/m   Wt Readings from Last 3 Encounters:  12/27/18 171 lb 3.2 oz (77.7 kg)  10/26/18 174 lb (78.9 kg)  07/25/18 170 lb 6.4 oz (77.3 kg)    Gen: wd, wn, nad Skin: left lateral distal leg with  1.5 cm diameter ulceration with some mild purulent debris, with 2cm surrounding pink/red coloration with slight warmth.  No induration, no fluctuance.   Mild swelling in general area of pink/red coloration. Leg is mildly tender in same area of ulceration.   Pedal pulse 1+.  Leg sensation normal.    bilat lower legs with some dry skin, some flaking of skin and hemosiderin staining suggestive of chronic venous insufficiency    Assessment: Encounter Diagnoses  Name Primary?  . Skin infection Yes  . Skin ulcer, unspecified ulcer stage ([place])       Plan: Discussed findings, discussed recommendations as below.  advised they call back in 48 hours with symptoms update.   Patient Instructions  Currently the left lower leg is showing some signs of skin infection and an ulceration of left lower leg  There is redness around the ulceration that extends out about 2 inches.     Recommendations:  Begin Keflex antibiotic 500mg  tablet, 3  times daily for 10 days  Begin topical Mupirocin ointment 3 times daily  Keep the wound and skin clean with soap and water  You can put a gauze with paper tape or nonstick bandage with Coban over the ulceration  Change the dressing 1 or 2 times daily  Watch for signs of worsening/spreading redness, warmth, fever, chills, nausea, etc.     If not improving over the next 2-3 days, let us know by Friday    Mary was seen today for spot on ankle.  Diagnoses and all orders for this visit:  Skin infection  Skin ulcer, unspecified ulcer stage ([place])  Other orders -     mupirocin ointment (BACTROBAN) 2 %; Apply 1 application topically 3 (three) times daily. -     cephALEXin (KEFLEX) 500 MG capsule; Take 1 capsule (500 mg total) by mouth 3 (three) times daily.

## 2019-01-08 ENCOUNTER — Other Ambulatory Visit: Payer: Self-pay

## 2019-01-08 ENCOUNTER — Ambulatory Visit (INDEPENDENT_AMBULATORY_CARE_PROVIDER_SITE_OTHER): Payer: Medicare Other | Admitting: Family Medicine

## 2019-01-08 ENCOUNTER — Encounter: Payer: Self-pay | Admitting: Family Medicine

## 2019-01-08 VITALS — BP 138/74 | HR 78 | Temp 98.7°F

## 2019-01-08 DIAGNOSIS — L089 Local infection of the skin and subcutaneous tissue, unspecified: Secondary | ICD-10-CM | POA: Diagnosis not present

## 2019-01-08 MED ORDER — AMOXICILLIN-POT CLAVULANATE 875-125 MG PO TABS: 1.0000 | ORAL_TABLET | Freq: Two times a day (BID) | ORAL | 0 refills | Status: DC

## 2019-01-08 NOTE — Progress Notes (Signed)
   Subjective:    Patient ID: Nicholas [name]., male    DOB: [birth year], 85 y.o.   MRN: [id number]  HPI He is here for reevaluation of an infection in his left lateral ankle area.  He has been on cephalexin 3 times daily and states that has not improved but is also not gotten worse.   Review of Systems     Objective:   Physical Exam Alert and in no distress.  Left lateral malleolar area was evaluated.  Small amount of serous drainage is noted.  Slight erythema and tenderness to that area is noted.  The area is roughly 3 x 4 cm in size.  The rest the foot exam appears normal       Assessment & Plan:  Skin infection - Plan: amoxicillin-clavulanate (AUGMENTIN) 875-125 MG tablet,  I will switch to Augmentin.  Recommend changing the dressing daily.  A picture was taken.  There is stay on the antibiotic for 2 weeks and then call me.  I explained that this could easily take another 2 weeks of treatment.

## 2019-01-09 ENCOUNTER — Inpatient Hospital Stay: Payer: Medicare Other

## 2019-01-09 ENCOUNTER — Ambulatory Visit: Payer: Medicare Other

## 2019-01-09 ENCOUNTER — Inpatient Hospital Stay: Payer: Medicare Other | Attending: Oncology

## 2019-01-09 ENCOUNTER — Other Ambulatory Visit: Payer: Self-pay | Admitting: *Deleted

## 2019-01-09 ENCOUNTER — Inpatient Hospital Stay: Payer: Medicare Other | Admitting: Medical

## 2019-01-09 ENCOUNTER — Other Ambulatory Visit: Payer: Self-pay

## 2019-01-09 ENCOUNTER — Telehealth: Payer: Self-pay

## 2019-01-09 VITALS — BP 140/69 | HR 69 | Temp 98.9°F | Resp 17

## 2019-01-09 DIAGNOSIS — D473 Essential (hemorrhagic) thrombocythemia: Secondary | ICD-10-CM

## 2019-01-09 DIAGNOSIS — N189 Chronic kidney disease, unspecified: Secondary | ICD-10-CM | POA: Diagnosis not present

## 2019-01-09 DIAGNOSIS — N183 Chronic kidney disease, stage 3 unspecified: Secondary | ICD-10-CM

## 2019-01-09 DIAGNOSIS — D631 Anemia in chronic kidney disease: Secondary | ICD-10-CM

## 2019-01-09 DIAGNOSIS — R161 Splenomegaly, not elsewhere classified: Secondary | ICD-10-CM

## 2019-01-09 LAB — CBC WITH DIFFERENTIAL/PLATELET
Abs Immature Granulocytes: 0.76 10*3/uL — ABNORMAL HIGH (ref 0.00–0.07)
Abs Immature Granulocytes: 0.74 10*3/uL — ABNORMAL HIGH (ref 0.00–0.07)
Basophils Absolute: 0.2 10*3/uL — ABNORMAL HIGH (ref 0.0–0.1)
Basophils Absolute: 0.2 10*3/uL — ABNORMAL HIGH (ref 0.0–0.1)
Basophils Relative: 2 %
Basophils Relative: 1 %
Eosinophils Absolute: 0 10*3/uL (ref 0.0–0.5)
Eosinophils Absolute: 0 10*3/uL (ref 0.0–0.5)
Eosinophils Relative: 0 %
Eosinophils Relative: 0 %
HCT: 34.1 % — ABNORMAL LOW (ref 39.0–52.0)
HCT: 29.7 % — ABNORMAL LOW (ref 39.0–52.0)
Hemoglobin: 10.9 g/dL — ABNORMAL LOW (ref 13.0–17.0)
Hemoglobin: 9.5 g/dL — ABNORMAL LOW (ref 13.0–17.0)
Immature Granulocytes: 5 %
Immature Granulocytes: 6 %
Lymphocytes Relative: 16 %
Lymphocytes Relative: 18 %
Lymphs Abs: 2.3 10*3/uL (ref 0.7–4.0)
Lymphs Abs: 2.3 10*3/uL (ref 0.7–4.0)
MCH: 35 pg — ABNORMAL HIGH (ref 26.0–34.0)
MCH: 35.7 pg — ABNORMAL HIGH (ref 26.0–34.0)
MCHC: 32 g/dL (ref 30.0–36.0)
MCHC: 32 g/dL (ref 30.0–36.0)
MCV: 109.6 fL — ABNORMAL HIGH (ref 80.0–100.0)
MCV: 111.7 fL — ABNORMAL HIGH (ref 80.0–100.0)
Monocytes Absolute: 1.8 10*3/uL — ABNORMAL HIGH (ref 0.1–1.0)
Monocytes Absolute: 1.7 10*3/uL — ABNORMAL HIGH (ref 0.1–1.0)
Monocytes Relative: 12 %
Monocytes Relative: 14 %
Neutro Abs: 9.4 10*3/uL — ABNORMAL HIGH (ref 1.7–7.7)
Neutro Abs: 7.7 10*3/uL (ref 1.7–7.7)
Neutrophils Relative %: 65 %
Neutrophils Relative %: 61 %
Platelets: 411 10*3/uL — ABNORMAL HIGH (ref 150–400)
Platelets: 349 10*3/uL (ref 150–400)
RBC: 3.11 MIL/uL — ABNORMAL LOW (ref 4.22–5.81)
RBC: 2.66 MIL/uL — ABNORMAL LOW (ref 4.22–5.81)
RDW: 17.1 % — ABNORMAL HIGH (ref 11.5–15.5)
RDW: 17.5 % — ABNORMAL HIGH (ref 11.5–15.5)
WBC: 14.4 10*3/uL — ABNORMAL HIGH (ref 4.0–10.5)
WBC: 12.6 10*3/uL — ABNORMAL HIGH (ref 4.0–10.5)
nRBC: 0.2 % (ref 0.0–0.2)
nRBC: 0.2 % (ref 0.0–0.2)

## 2019-01-09 LAB — PREPARE RBC (CROSSMATCH)

## 2019-01-09 LAB — ABO/RH: ABO/RH(D): AB NEG

## 2019-01-09 LAB — SAMPLE TO BLOOD BANK

## 2019-01-09 MED ORDER — DIPHENHYDRAMINE HCL 25 MG PO CAPS
ORAL_CAPSULE | ORAL | Status: AC
  Filled 2019-01-09: qty 1

## 2019-01-09 MED ORDER — DARBEPOETIN ALFA 300 MCG/0.6ML IJ SOSY
300.0000 ug | PREFILLED_SYRINGE | Freq: Once | INTRAMUSCULAR | Status: AC
  Administered 2019-01-09: 300 ug via SUBCUTANEOUS

## 2019-01-09 MED ORDER — DARBEPOETIN ALFA 300 MCG/0.6ML IJ SOSY
PREFILLED_SYRINGE | INTRAMUSCULAR | Status: AC
  Filled 2019-01-09: qty 0.6

## 2019-01-09 MED ORDER — ACETAMINOPHEN 325 MG PO TABS
650.0000 mg | ORAL_TABLET | Freq: Once | ORAL | Status: AC
  Administered 2019-01-09: 650 mg via ORAL

## 2019-01-09 MED ORDER — SODIUM CHLORIDE 0.9% IV SOLUTION
250.0000 mL | Freq: Once | INTRAVENOUS | Status: DC
  Filled 2019-01-09: qty 250

## 2019-01-09 MED ORDER — ACETAMINOPHEN 325 MG PO TABS
ORAL_TABLET | ORAL | Status: AC
  Filled 2019-01-09: qty 2

## 2019-01-09 MED ORDER — DIPHENHYDRAMINE HCL 25 MG PO CAPS
25.0000 mg | ORAL_CAPSULE | Freq: Once | ORAL | Status: AC
  Administered 2019-01-09: 17:00:00 25 mg via ORAL

## 2019-01-09 NOTE — Progress Notes (Signed)
Pt tolerated PRBC transfusion without issue. Vitals stable and discharge instructions reviewed with pt, with emphasis on S/S of a late transfusion reaction and when to call 911. Pt verbalized understanding.   Pt assisted out to daughter's car via wheelchair and both verbalized understanding of pt's additional transfusion appt tomorrow 7/29 at 14:30.

## 2019-01-09 NOTE — Progress Notes (Addendum)
Nicholas [name] was seen in the infusion room today. He presented for consideration of an Aranesp injection. I was called to see the patient after being told that he had experienced a syncopal episode. I was informed that the patient had been misidentified and had a therapeutic phlebotomy performed. His H/H was 10.9 and 34.1 prior to the phlebotomy. He was begun on IVF's and supplemental oxygen prior to my arrival. He was awake and alert as I arrived.  PE:  BP: 63/47  P: 51  SPO2:  93 % on 2 LPM via [place] BP: 105/61  P:  70  SPO2:  93 % on 2 LPM via [place] BP:  111/58  P:  70  SPO2:  100 % on 1 LPM via [place] BP:  124/59  P:  61  SPO2:  99 % on room air  General:  Elderly male in no acute distress, Alert and oriented HEENT;  Normocephalic and atraumatic CV:  RRR w/o M/R/G Lungs:  CTA w/o W/R/R  Plan:  1) CBC per Dr. Jana Hakim   2) T&C for 2 units of PRBC's per Dr. Jana Hakim, 1 unit today and 1 tomorrow   Nicholas [name] was seen with Dr. Esperanza Heir, MHS, PA-C Physician Assistant   ADDENDUM: I went by and saw Nicholas [name] in the infusion area.  It is not clear exactly what happened but it seems that another patient was called and he thought he was being called.  In any case his name should have been checked off the bracelet and not verbally since he has poor hearing.  I went over and apologized for our mistake.  Initially he did not hear well and asked me why I was drawing blood today.  I explained that this was a mistake and that I apologized and actually we were going to need to give him back some blood since he was feeling a bit weak and faint.  He said he has been feeling a bit faint before because of an antibiotic that was started last week.    Just in case we proceeded to transfuse 1 unit today and will transfuse another unit tomorrow.  Note that a repeat hemoglobin would not likely to show much change since there has not been enough time for fluid redistribution  I personally saw this patient  and performed a substantive portion of this encounter with the listed APP documented above.   [name], MD Medical Oncology and Hematology [facility] [address] [place], [place] [address] Tel. [phone]    Fax. [phone]

## 2019-01-09 NOTE — Patient Instructions (Addendum)
Blood Transfusion, Adult, Care After This sheet gives you information about how to care for yourself after your procedure. Your doctor may also give you more specific instructions. If you have problems or questions, contact your doctor. Follow these instructions at home:   Take over-the-counter and prescription medicines only as told by your doctor.  Go back to your normal activities as told by your doctor.  Follow instructions from your doctor about how to take care of the area where an IV tube was put into your vein (insertion site). Make sure you: ? Wash your hands with soap and water before you change your bandage (dressing). If there is no soap and water, use hand sanitizer. ? Change your bandage as told by your doctor.  Check your IV insertion site every day for signs of infection. Check for: ? More redness, swelling, or pain. ? More fluid or blood. ? Warmth. ? Pus or a bad smell. Contact a doctor if:  You have more redness, swelling, or pain around the IV insertion site.  You have more fluid or blood coming from the IV insertion site.  Your IV insertion site feels warm to the touch.  You have pus or a bad smell coming from the IV insertion site.  Your pee (urine) turns pink, red, or brown.  You feel weak after doing your normal activities. Get help right away if:  You have signs of a serious allergic or body defense (immune) system reaction, including: ? Itchiness. ? Hives. ? Trouble breathing. ? Anxiety. ? Pain in your chest or lower back. ? Fever, flushing, and chills. ? Fast pulse. ? Rash. ? Watery poop (diarrhea). ? Throwing up (vomiting). ? Dark pee. ? Serious headache. ? Dizziness. ? Stiff neck. ? Yellow color in your face or the white parts of your eyes (jaundice). Summary  After a blood transfusion, return to your normal activities as told by your doctor.  Every day, check for signs of infection where the IV tube was put into your vein.  Some  signs of infection are warm skin, more redness and pain, more fluid or blood, and pus or a bad smell where the needle went in.  Contact your doctor if you feel weak or have any unusual symptoms. This information is not intended to replace advice given to you by your health care provider. Make sure you discuss any questions you have with your health care provider. Document Released: 06/21/2014 Document Revised: 10/05/2017 Document Reviewed: 01/23/2016 Elsevier Patient Education  [place].   Darbepoetin Alfa injection What is this medicine? DARBEPOETIN ALFA (dar be POE e tin AL fa) helps your body make more red blood cells. It is used to treat anemia caused by chronic kidney failure and chemotherapy. This medicine may be used for other purposes; ask your health care provider or pharmacist if you have questions. COMMON BRAND NAME(S): Aranesp What should I tell my health care provider before I take this medicine? They need to know if you have any of these conditions:  blood clotting disorders or history of blood clots  cancer patient not on chemotherapy  cystic fibrosis  heart disease, such as angina, heart failure, or a history of a heart attack  hemoglobin level of 12 g/dL or greater  high blood pressure  low levels of folate, iron, or vitamin B12  seizures  an unusual or allergic reaction to darbepoetin, erythropoietin, albumin, hamster proteins, latex, other medicines, foods, dyes, or preservatives  pregnant or trying to get pregnant  breast-feeding How should I use this medicine? This medicine is for injection into a vein or under the skin. It is usually given by a health care professional in a hospital or clinic setting. If you get this medicine at home, you will be taught how to prepare and give this medicine. Use exactly as directed. Take your medicine at regular intervals. Do not take your medicine more often than directed. It is important that you put your used  needles and syringes in a special sharps container. Do not put them in a trash can. If you do not have a sharps container, call your pharmacist or healthcare provider to get one. A special MedGuide will be given to you by the pharmacist with each prescription and refill. Be sure to read this information carefully each time. Talk to your pediatrician regarding the use of this medicine in children. While this medicine may be used in children as young as 23 month of age for selected conditions, precautions do apply. Overdosage: If you think you have taken too much of this medicine contact a poison control center or emergency room at once. NOTE: This medicine is only for you. Do not share this medicine with others. What if I miss a dose? If you miss a dose, take it as soon as you can. If it is almost time for your next dose, take only that dose. Do not take double or extra doses. What may interact with this medicine? Do not take this medicine with any of the following medications:  epoetin alfa This list may not describe all possible interactions. Give your health care provider a list of all the medicines, herbs, non-prescription drugs, or dietary supplements you use. Also tell them if you smoke, drink alcohol, or use illegal drugs. Some items may interact with your medicine. What should I watch for while using this medicine? Your condition will be monitored carefully while you are receiving this medicine. You may need blood work done while you are taking this medicine. This medicine may cause a decrease in vitamin B6. You should make sure that you get enough vitamin B6 while you are taking this medicine. Discuss the foods you eat and the vitamins you take with your health care professional. What side effects may I notice from receiving this medicine? Side effects that you should report to your doctor or health care professional as soon as possible:  allergic reactions like skin rash, itching or hives,  swelling of the face, lips, or tongue  breathing problems  changes in vision  chest pain  confusion, trouble speaking or understanding  feeling faint or lightheaded, falls  high blood pressure  muscle aches or pains  pain, swelling, warmth in the leg  rapid weight gain  severe headaches  sudden numbness or weakness of the face, arm or leg  trouble walking, dizziness, loss of balance or coordination  seizures (convulsions)  swelling of the ankles, feet, hands  unusually weak or tired Side effects that usually do not require medical attention (report to your doctor or health care professional if they continue or are bothersome):  diarrhea  fever, chills (flu-like symptoms)  headaches  nausea, vomiting  redness, stinging, or swelling at site where injected This list may not describe all possible side effects. Call your doctor for medical advice about side effects. You may report side effects to FDA at 1-800-FDA-1088. Where should I keep my medicine? Keep out of the reach of children. Store in a refrigerator between 2 and 8  degrees C (36 and 46 degrees F). Do not freeze. Do not shake. Throw away any unused portion if using a single-dose vial. Throw away any unused medicine after the expiration date. NOTE: This sheet is a summary. It may not cover all possible information. If you have questions about this medicine, talk to your doctor, pharmacist, or health care provider.  2020 Elsevier/Gold Standard (2017-06-15 16:44:20)   Therapeutic Phlebotomy, Care After This sheet gives you information about how to care for yourself after your procedure. Your health care provider may also give you more specific instructions. If you have problems or questions, contact your health care provider. What can I expect after the procedure? After the procedure, it is common to have:  Light-headedness or dizziness. You may feel faint.  Nausea.  Tiredness (fatigue). Follow these  instructions at home: Eating and drinking  Be sure to eat well-balanced meals for the next 24 hours.  Drink enough fluid to keep your urine pale yellow.  Avoid drinking alcohol on the day that you had the procedure. Activity   Return to your normal activities as told by your health care provider. Most people can go back to their normal activities right away.  Avoid activities that take a lot of effort for about 5 hours after the procedure. Athletes should avoid strenuous exercise for at least 12 hours.  Avoid heavy lifting or pulling for about 5 hours after the procedure. Do not lift anything that is heavier than 10 lb (4.5 kg).  Change positions slowly for the remainder of the day. This will help to prevent light-headedness or fainting.  If you feel light-headed, lie down until the feeling goes away. Needle insertion site care   Keep your bandage (dressing) dry. You can remove the bandage after about 5 hours or as told by your health care provider.  If you have bleeding from the needle insertion site, raise (elevate) your arm and press firmly on the site until the bleeding stops.  If you have bruising at the site, apply ice to the area: ? Remove the dressing. ? Put ice in a plastic bag. ? Place a towel between your skin and the bag. ? Leave the ice on for 20 minutes, 2-3 times a day for the first 24 hours.  If the swelling does not go away after 24 hours, apply a warm, moist cloth (warm compress) to the area for 20 minutes, 2-3 times a day. General instructions  Do not use any products that contain nicotine or tobacco, such as cigarettes and e-cigarettes, for at least 30 minutes after the procedure.  Keep all follow-up visits as told by your health care provider. This is important. You may need to continue having regular therapeutic phlebotomy treatments as directed. Contact a health care provider if you:  Have redness, swelling, or pain at the needle insertion site.  Have  fluid or blood coming from the needle insertion site.  Have pus or a bad smell coming from the needle insertion site.  Notice that the needle insertion site feels warm to the touch.  Feel light-headed, dizzy, or nauseous, and the feeling does not go away.  Have new bruising at the needle insertion site.  Feel weaker than normal.  Have a fever or chills. Get help right away if:  You faint.  You have chest pain.  You have trouble breathing.  You have severe nausea or vomiting. Summary  After the procedure, it is common to have some light-headedness, dizziness, nausea, or tiredness (  fatigue).  Be sure to eat well-balanced meals for the next 24 hours. Drink enough fluid to keep your urine pale yellow.  Return to your normal activities as told by your health care provider.  Keep all follow-up visits as told by your health care provider. You may need to continue having regular therapeutic phlebotomy treatments as directed. This information is not intended to replace advice given to you by your health care provider. Make sure you discuss any questions you have with your health care provider. Document Released: 11/02/2010 Document Revised: 06/17/2017 Document Reviewed: 06/16/2017 Elsevier Patient Education  2020 [employer].

## 2019-01-09 NOTE — Progress Notes (Signed)
Patient was wheeled by RN from blue waiting room to infusion area after responding to other patient's name. RN asked patient's name and DOB but did not verify by looking at armband. Patient asked if he was getting injection; RN verified in (incorrect chart) there was no injection but a phlebotomy ordered. Procedure explained to patient and patient agreeable. Successful phlebotomy performed with 18g needle in the left AC. At the end of procedure, 476g of blood removed, patient became unresponsive. New IV started, NS hung to gravity, and emergency protocol initiated. Sandi Mealy, PA-C to bedside. RN looked down and noticed his armband did not match the patient as expected.  Patient became responsive and was able to tolerate snack and IVF. Stat CBC drawn and patient monitored. 2 Units blood ordered for patient; one unit scheduled 01/09/2019 (today) and one scheduled tomorrow- 01/10/2019. Daughter notified. Will continue to monitor.  Daughter [name] contacted by Sandi Mealy, PA-C. Situation explained and daughter verbalized understanding. We will notify daughter with updates and call her when we have a time Mr. Burkhalter will be discharged this evening after one unit of blood transfused.  Consent for blood transfusion obtained. Per Dr. Jana Hakim, give Aranesp 300mg  injection today.  Report given to [place], Therapist, sports.

## 2019-01-09 NOTE — Telephone Encounter (Signed)
Pt was seen yesterday for an infection in his ankle. Patient daughter called and stated patient is experiencing dizziness after taking the Amoxicillin and she wants to know what she should do? Is there something else patient can take to help with the infection?

## 2019-01-10 ENCOUNTER — Telehealth: Payer: Self-pay

## 2019-01-10 ENCOUNTER — Inpatient Hospital Stay: Payer: Medicare Other

## 2019-01-10 ENCOUNTER — Inpatient Hospital Stay: Payer: Medicare Other | Admitting: Oncology

## 2019-01-10 ENCOUNTER — Other Ambulatory Visit: Payer: Self-pay

## 2019-01-10 DIAGNOSIS — D631 Anemia in chronic kidney disease: Secondary | ICD-10-CM

## 2019-01-10 DIAGNOSIS — N189 Chronic kidney disease, unspecified: Secondary | ICD-10-CM | POA: Diagnosis not present

## 2019-01-10 DIAGNOSIS — N183 Chronic kidney disease, stage 3 unspecified: Secondary | ICD-10-CM

## 2019-01-10 DIAGNOSIS — D473 Essential (hemorrhagic) thrombocythemia: Secondary | ICD-10-CM

## 2019-01-10 LAB — PREPARE RBC (CROSSMATCH)

## 2019-01-10 MED ORDER — DIPHENHYDRAMINE HCL 25 MG PO CAPS
25.0000 mg | ORAL_CAPSULE | Freq: Once | ORAL | Status: AC
  Administered 2019-01-10: 25 mg via ORAL

## 2019-01-10 MED ORDER — SODIUM CHLORIDE 0.9% IV SOLUTION
250.0000 mL | Freq: Once | INTRAVENOUS | Status: AC
  Administered 2019-01-10: 15:00:00 250 mL via INTRAVENOUS
  Filled 2019-01-10: qty 250

## 2019-01-10 MED ORDER — DIPHENHYDRAMINE HCL 25 MG PO CAPS
ORAL_CAPSULE | ORAL | Status: AC
  Filled 2019-01-10: qty 1

## 2019-01-10 MED ORDER — ACETAMINOPHEN 325 MG PO TABS
650.0000 mg | ORAL_TABLET | Freq: Once | ORAL | Status: AC
  Administered 2019-01-10: 650 mg via ORAL

## 2019-01-10 MED ORDER — ACETAMINOPHEN 325 MG PO TABS
ORAL_TABLET | ORAL | Status: AC
  Filled 2019-01-10: qty 2

## 2019-01-10 NOTE — Progress Notes (Signed)
I saw Mr. Nicholas [name] in the treatment area today.  He looks considerably better today than he did yesterday.  He feels fine, he says.  He has had no accidents since yesterday's phlebotomy and transfusion.  He will receive a second unit today.  He will return to see me and to receive his next Aranesp shot 02/06/2019.  He knows to call for any other issue that may develop before then.

## 2019-01-10 NOTE — Telephone Encounter (Signed)
Called daughter Venida Jarvis, she states Cancer center drew a pint of blood off of her daddy yesterday.  They were supposed to do a test and give him a shot.  He almost passed out and then they gave him blood.  Advised Sherri that Dr. [facility] request she taker her dad to hospital.  She will talk to her father and make a decision.

## 2019-01-10 NOTE — Patient Instructions (Signed)
Blood Transfusion, Adult, Care After This sheet gives you information about how to care for yourself after your procedure. Your doctor may also give you more specific instructions. If you have problems or questions, contact your doctor. Follow these instructions at home:   Take over-the-counter and prescription medicines only as told by your doctor.  Go back to your normal activities as told by your doctor.  Follow instructions from your doctor about how to take care of the area where an IV tube was put into your vein (insertion site). Make sure you: ? Wash your hands with soap and water before you change your bandage (dressing). If there is no soap and water, use hand sanitizer. ? Change your bandage as told by your doctor.  Check your IV insertion site every day for signs of infection. Check for: ? More redness, swelling, or pain. ? More fluid or blood. ? Warmth. ? Pus or a bad smell. Contact a doctor if:  You have more redness, swelling, or pain around the IV insertion site.  You have more fluid or blood coming from the IV insertion site.  Your IV insertion site feels warm to the touch.  You have pus or a bad smell coming from the IV insertion site.  Your pee (urine) turns pink, red, or brown.  You feel weak after doing your normal activities. Get help right away if:  You have signs of a serious allergic or body defense (immune) system reaction, including: ? Itchiness. ? Hives. ? Trouble breathing. ? Anxiety. ? Pain in your chest or lower back. ? Fever, flushing, and chills. ? Fast pulse. ? Rash. ? Watery poop (diarrhea). ? Throwing up (vomiting). ? Dark pee. ? Serious headache. ? Dizziness. ? Stiff neck. ? Yellow color in your face or the white parts of your eyes (jaundice). Summary  After a blood transfusion, return to your normal activities as told by your doctor.  Every day, check for signs of infection where the IV tube was put into your vein.  Some  signs of infection are warm skin, more redness and pain, more fluid or blood, and pus or a bad smell where the needle went in.  Contact your doctor if you feel weak or have any unusual symptoms. This information is not intended to replace advice given to you by your health care provider. Make sure you discuss any questions you have with your health care provider. Document Released: 06/21/2014 Document Revised: 10/05/2017 Document Reviewed: 01/23/2016 Elsevier Patient Education  [place].   Darbepoetin Alfa injection What is this medicine? DARBEPOETIN ALFA (dar be POE e tin AL fa) helps your body make more red blood cells. It is used to treat anemia caused by chronic kidney failure and chemotherapy. This medicine may be used for other purposes; ask your health care provider or pharmacist if you have questions. COMMON BRAND NAME(S): Aranesp What should I tell my health care provider before I take this medicine? They need to know if you have any of these conditions:  blood clotting disorders or history of blood clots  cancer patient not on chemotherapy  cystic fibrosis  heart disease, such as angina, heart failure, or a history of a heart attack  hemoglobin level of 12 g/dL or greater  high blood pressure  low levels of folate, iron, or vitamin B12  seizures  an unusual or allergic reaction to darbepoetin, erythropoietin, albumin, hamster proteins, latex, other medicines, foods, dyes, or preservatives  pregnant or trying to get pregnant  breast-feeding How should I use this medicine? This medicine is for injection into a vein or under the skin. It is usually given by a health care professional in a hospital or clinic setting. If you get this medicine at home, you will be taught how to prepare and give this medicine. Use exactly as directed. Take your medicine at regular intervals. Do not take your medicine more often than directed. It is important that you put your used  needles and syringes in a special sharps container. Do not put them in a trash can. If you do not have a sharps container, call your pharmacist or healthcare provider to get one. A special MedGuide will be given to you by the pharmacist with each prescription and refill. Be sure to read this information carefully each time. Talk to your pediatrician regarding the use of this medicine in children. While this medicine may be used in children as young as 40 month of age for selected conditions, precautions do apply. Overdosage: If you think you have taken too much of this medicine contact a poison control center or emergency room at once. NOTE: This medicine is only for you. Do not share this medicine with others. What if I miss a dose? If you miss a dose, take it as soon as you can. If it is almost time for your next dose, take only that dose. Do not take double or extra doses. What may interact with this medicine? Do not take this medicine with any of the following medications:  epoetin alfa This list may not describe all possible interactions. Give your health care provider a list of all the medicines, herbs, non-prescription drugs, or dietary supplements you use. Also tell them if you smoke, drink alcohol, or use illegal drugs. Some items may interact with your medicine. What should I watch for while using this medicine? Your condition will be monitored carefully while you are receiving this medicine. You may need blood work done while you are taking this medicine. This medicine may cause a decrease in vitamin B6. You should make sure that you get enough vitamin B6 while you are taking this medicine. Discuss the foods you eat and the vitamins you take with your health care professional. What side effects may I notice from receiving this medicine? Side effects that you should report to your doctor or health care professional as soon as possible:  allergic reactions like skin rash, itching or hives,  swelling of the face, lips, or tongue  breathing problems  changes in vision  chest pain  confusion, trouble speaking or understanding  feeling faint or lightheaded, falls  high blood pressure  muscle aches or pains  pain, swelling, warmth in the leg  rapid weight gain  severe headaches  sudden numbness or weakness of the face, arm or leg  trouble walking, dizziness, loss of balance or coordination  seizures (convulsions)  swelling of the ankles, feet, hands  unusually weak or tired Side effects that usually do not require medical attention (report to your doctor or health care professional if they continue or are bothersome):  diarrhea  fever, chills (flu-like symptoms)  headaches  nausea, vomiting  redness, stinging, or swelling at site where injected This list may not describe all possible side effects. Call your doctor for medical advice about side effects. You may report side effects to FDA at 1-800-FDA-1088. Where should I keep my medicine? Keep out of the reach of children. Store in a refrigerator between 2 and 8  degrees C (36 and 46 degrees F). Do not freeze. Do not shake. Throw away any unused portion if using a single-dose vial. Throw away any unused medicine after the expiration date. NOTE: This sheet is a summary. It may not cover all possible information. If you have questions about this medicine, talk to your doctor, pharmacist, or health care provider.  2020 Elsevier/Gold Standard (2017-06-15 16:44:20)   Therapeutic Phlebotomy, Care After This sheet gives you information about how to care for yourself after your procedure. Your health care provider may also give you more specific instructions. If you have problems or questions, contact your health care provider. What can I expect after the procedure? After the procedure, it is common to have:  Light-headedness or dizziness. You may feel faint.  Nausea.  Tiredness (fatigue). Follow these  instructions at home: Eating and drinking  Be sure to eat well-balanced meals for the next 24 hours.  Drink enough fluid to keep your urine pale yellow.  Avoid drinking alcohol on the day that you had the procedure. Activity   Return to your normal activities as told by your health care provider. Most people can go back to their normal activities right away.  Avoid activities that take a lot of effort for about 5 hours after the procedure. Athletes should avoid strenuous exercise for at least 12 hours.  Avoid heavy lifting or pulling for about 5 hours after the procedure. Do not lift anything that is heavier than 10 lb (4.5 kg).  Change positions slowly for the remainder of the day. This will help to prevent light-headedness or fainting.  If you feel light-headed, lie down until the feeling goes away. Needle insertion site care   Keep your bandage (dressing) dry. You can remove the bandage after about 5 hours or as told by your health care provider.  If you have bleeding from the needle insertion site, raise (elevate) your arm and press firmly on the site until the bleeding stops.  If you have bruising at the site, apply ice to the area: ? Remove the dressing. ? Put ice in a plastic bag. ? Place a towel between your skin and the bag. ? Leave the ice on for 20 minutes, 2-3 times a day for the first 24 hours.  If the swelling does not go away after 24 hours, apply a warm, moist cloth (warm compress) to the area for 20 minutes, 2-3 times a day. General instructions  Do not use any products that contain nicotine or tobacco, such as cigarettes and e-cigarettes, for at least 30 minutes after the procedure.  Keep all follow-up visits as told by your health care provider. This is important. You may need to continue having regular therapeutic phlebotomy treatments as directed. Contact a health care provider if you:  Have redness, swelling, or pain at the needle insertion site.  Have  fluid or blood coming from the needle insertion site.  Have pus or a bad smell coming from the needle insertion site.  Notice that the needle insertion site feels warm to the touch.  Feel light-headed, dizzy, or nauseous, and the feeling does not go away.  Have new bruising at the needle insertion site.  Feel weaker than normal.  Have a fever or chills. Get help right away if:  You faint.  You have chest pain.  You have trouble breathing.  You have severe nausea or vomiting. Summary  After the procedure, it is common to have some light-headedness, dizziness, nausea, or tiredness (  fatigue).  Be sure to eat well-balanced meals for the next 24 hours. Drink enough fluid to keep your urine pale yellow.  Return to your normal activities as told by your health care provider.  Keep all follow-up visits as told by your health care provider. You may need to continue having regular therapeutic phlebotomy treatments as directed. This information is not intended to replace advice given to you by your health care provider. Make sure you discuss any questions you have with your health care provider. Document Released: 11/02/2010 Document Revised: 06/17/2017 Document Reviewed: 06/16/2017 Elsevier Patient Education  2020 [employer].

## 2019-01-10 NOTE — Telephone Encounter (Signed)
Time to go to the hospital

## 2019-01-10 NOTE — Progress Notes (Signed)
Patient tolerated PRBC transfusion without issues and vitals are stable. The patient was educated on the S/S of a post transfusion reaction and told to call 911 should they occur. The patient verbalized understanding. The patient was wheeled to the front to daughter's car.

## 2019-01-10 NOTE — Telephone Encounter (Signed)
Spoke to Nicholas [name], [name] daughter about her dad receiving blood after his phlebotomy yesterday.  She wanted to know if there were any long term effects and I explained to her that there are not.  We are replacing what we took off and he should feel much better today.  Gardiner Rhyme

## 2019-01-11 LAB — TYPE AND SCREEN
ABO/RH(D): AB NEG
Antibody Screen: NEGATIVE
Unit division: 0
Unit division: 0

## 2019-01-11 LAB — BPAM RBC
Blood Product Expiration Date: 202008252359
Blood Product Expiration Date: 202008252359
ISSUE DATE / TIME: 202007281733
ISSUE DATE / TIME: 202007291544
Unit Type and Rh: 600
Unit Type and Rh: 600

## 2019-01-17 ENCOUNTER — Other Ambulatory Visit: Payer: Self-pay

## 2019-01-17 DIAGNOSIS — D631 Anemia in chronic kidney disease: Secondary | ICD-10-CM

## 2019-01-18 ENCOUNTER — Inpatient Hospital Stay (HOSPITAL_BASED_OUTPATIENT_CLINIC_OR_DEPARTMENT_OTHER): Payer: Medicare Other | Admitting: Adult Health

## 2019-01-18 ENCOUNTER — Other Ambulatory Visit: Payer: Self-pay

## 2019-01-18 ENCOUNTER — Ambulatory Visit (HOSPITAL_COMMUNITY)
Admission: RE | Admit: 2019-01-18 | Discharge: 2019-01-18 | Disposition: A | Discharge status: Home / Self Care | Payer: Medicare Other | Source: Ambulatory Visit | Attending: Adult Health | Admitting: Adult Health

## 2019-01-18 ENCOUNTER — Inpatient Hospital Stay: Payer: Medicare Other | Attending: Oncology

## 2019-01-18 VITALS — BP 123/51 | HR 75 | Temp 97.5°F | Resp 18 | Ht 74.0 in | Wt 167.4 lb

## 2019-01-18 DIAGNOSIS — N184 Chronic kidney disease, stage 4 (severe): Secondary | ICD-10-CM

## 2019-01-18 DIAGNOSIS — N189 Chronic kidney disease, unspecified: Secondary | ICD-10-CM | POA: Diagnosis not present

## 2019-01-18 DIAGNOSIS — D473 Essential (hemorrhagic) thrombocythemia: Secondary | ICD-10-CM

## 2019-01-18 DIAGNOSIS — M25572 Pain in left ankle and joints of left foot: Secondary | ICD-10-CM | POA: Insufficient documentation

## 2019-01-18 DIAGNOSIS — L03116 Cellulitis of left lower limb: Secondary | ICD-10-CM | POA: Diagnosis not present

## 2019-01-18 DIAGNOSIS — D631 Anemia in chronic kidney disease: Secondary | ICD-10-CM

## 2019-01-18 DIAGNOSIS — R5383 Other fatigue: Secondary | ICD-10-CM | POA: Insufficient documentation

## 2019-01-18 DIAGNOSIS — Z7982 Long term (current) use of aspirin: Secondary | ICD-10-CM | POA: Diagnosis not present

## 2019-01-18 DIAGNOSIS — Z87891 Personal history of nicotine dependence: Secondary | ICD-10-CM | POA: Insufficient documentation

## 2019-01-18 LAB — CMP (CANCER CENTER ONLY)
ALT: 16 U/L (ref 0–44)
AST: 22 U/L (ref 15–41)
Albumin: 3.6 g/dL (ref 3.5–5.0)
Alkaline Phosphatase: 75 U/L (ref 38–126)
Anion gap: 13 (ref 5–15)
BUN: 24 mg/dL — ABNORMAL HIGH (ref 8–23)
CO2: 21 mmol/L — ABNORMAL LOW (ref 22–32)
Calcium: 9.1 mg/dL (ref 8.9–10.3)
Chloride: 108 mmol/L (ref 98–111)
Creatinine: 1.7 mg/dL — ABNORMAL HIGH (ref 0.61–1.24)
GFR, Est AFR Am: 41 mL/min — ABNORMAL LOW (ref >60)
GFR, Estimated: 35 mL/min — ABNORMAL LOW (ref >60)
Glucose, Bld: 137 mg/dL — ABNORMAL HIGH (ref 70–99)
Potassium: 4 mmol/L (ref 3.5–5.1)
Sodium: 142 mmol/L (ref 135–145)
Total Bilirubin: 0.5 mg/dL (ref 0.3–1.2)
Total Protein: 6.9 g/dL (ref 6.5–8.1)

## 2019-01-18 LAB — CBC WITH DIFFERENTIAL (CANCER CENTER ONLY)
Abs Immature Granulocytes: 1.06 10*3/uL — ABNORMAL HIGH (ref 0.00–0.07)
Basophils Absolute: 0.2 10*3/uL — ABNORMAL HIGH (ref 0.0–0.1)
Basophils Relative: 2 %
Eosinophils Absolute: 0 10*3/uL (ref 0.0–0.5)
Eosinophils Relative: 0 %
HCT: 39.4 % (ref 39.0–52.0)
Hemoglobin: 12.6 g/dL — ABNORMAL LOW (ref 13.0–17.0)
Immature Granulocytes: 8 %
Lymphocytes Relative: 17 %
Lymphs Abs: 2.2 10*3/uL (ref 0.7–4.0)
MCH: 35 pg — ABNORMAL HIGH (ref 26.0–34.0)
MCHC: 32 g/dL (ref 30.0–36.0)
MCV: 109.4 fL — ABNORMAL HIGH (ref 80.0–100.0)
Monocytes Absolute: 1.2 10*3/uL — ABNORMAL HIGH (ref 0.1–1.0)
Monocytes Relative: 9 %
Neutro Abs: 8.3 10*3/uL — ABNORMAL HIGH (ref 1.7–7.7)
Neutrophils Relative %: 64 %
Platelet Count: 465 10*3/uL — ABNORMAL HIGH (ref 150–400)
RBC: 3.6 MIL/uL — ABNORMAL LOW (ref 4.22–5.81)
RDW: 19.1 % — ABNORMAL HIGH (ref 11.5–15.5)
WBC Count: 13 10*3/uL — ABNORMAL HIGH (ref 4.0–10.5)
nRBC: 0.9 % — ABNORMAL HIGH (ref 0.0–0.2)

## 2019-01-18 NOTE — Progress Notes (Addendum)
[name]  [phone] [phone]   ID: Nicholas [name].   DOB: [birth year]  MR#: [id number]  GYJ#:[id number]  Patient Care Team: Denita Lung, MD as PCP - General (Family Medicine) Magrinat, Virgie Dad, MD as Consulting Physician (Oncology) Tysinger, Camelia Eng, PA-C as Physician Assistant (Family Medicine)  OTHERS: Dr. Posey Pronto (eye doctor)   CHIEF COMPLAINT:  Essential thrombocytosis and chronic anemia  CURRENT TREATMENT: hydroxyurea; Aranesp   INTERVAL HISTORY: Caliber returns today for follow-up and treatment of his myeloproliferative disorder. He is accompanied by his daughter, Nicholas [name].  He continues on hydroxyurea.  His platelet count is generally well controlled.  He is taking 536m daily.  He also continues on Aranesp.  REVIEW OF SYSTEMS: Nicholas [name] unfortunately has been feeling poorly for about 2-3 weeks.  It began when he scratched his left ankle and he developed cellulitis.  He was treated with keflex x 1 week, however the pain continued so his antibiotics were changed to augmentin.  He has remained fatigued.  On the day his antibiotics were changed, he was here, and mistakenly underwent phlebotomy and had a syncopal episode.  He was given IV fluids, and then a day later blood.  His daughter isn't sure if it is the antibiotics, the infection, or a potential sequelae from the incident last week with the phlebotomy and transfusion.    [name] has had no fever.  He is not confused.  He has no chills or night sweats. He has no stomach cramping, nausea, vomiting, or diarrhea.  He isn't light headed.  He has no headaches or vision changes.  He has no cough, shortness of breath, chest pain, or palpitations.  A detailed ROS was non contributory.     PAST MEDICAL HISTORY: Past Medical History:  Diagnosis Date  . Abnormal CT scan, sinus 10/04/06   pansinusitis, mildly deviated septum  . Allergy   . Anemia    related to CKD  . Chronic sinusitis   .  Dysthymia    dysthymia  . H/O echocardiogram 01/04/2007   LV function normal, 55-60% EF, mildly increased thickenss of LV wall  . H/O echocardiogram 12/2006   mild LV wall thickening, EF 55-60%, LV function normal  . History of MRI of brain and brain stem 02/26/06   sinusitis, nonspecific deep white matter changes normal for age  . Hypertension   . Seborrheic keratosis    sees HToni Arthurs NP dermatology  . Thrombocytosis ([place]    sees hematology    PAST SURGICAL HISTORY: Past Surgical History:  Procedure Laterality Date  . HERNIA REPAIR     right inguinal repair  . I&D EXTREMITY Right 09/16/2017   Procedure: IRRIGATION AND DEBRIDEMENT EXTREMITY;  Surgeon: GRoseanne Kaufman MD;  [facility]  Service: Orthopedics;  Laterality: Right;  . TONSILLECTOMY     childhood  . VARICOSE VEIN SURGERY     right lower leg    FAMILY HISTORY Family History  Problem Relation Age of Onset  . Heart disease Father   . Stroke Father   . Cancer Neg Hx   . COPD Neg Hx   . Diabetes Neg Hx     SOCIAL HISTORY:  (Reviewed 11/28/2013) The patient lives with his wife, who suffers from diabetes and emphysema and has had some small strokes. She is able to care for herself. He gives her her insulin shots. He has 2 daughters, [place]a [place] in [place] and [place] and works with Dr [employer] .The patient has 3 grandchildren.[name]   ADVANCED DIRECTIVES: the patient tells me his daughter Gianno Volner would make health care decisions if he is incapacitated    HEALTH MAINTENANCE: (Updated 11/28/2013) Social History   Tobacco Use  . Smoking status: Former Smoker    Quit date: 06/14/1958    Years since quitting: 60.6  . Smokeless tobacco: Never Used  Substance Use Topics  . Alcohol use: No  . Drug use: No     Colonoscopy:  Not on file  PSA: Not on file  Bone density: Never  Lipid panel: October 2014  Allergies  Allergen Reactions  . Erythromycin Other (See  Comments)    Gums turned red and bled profusely  . Tape Other (See Comments)    The patient's SKIN IS VERY THIN AND TEARS AND BRUISES VERY EASILY!!    Current Outpatient Medications  Medication Sig Dispense Refill  . acetaminophen (TYLENOL) 650 MG CR tablet Take 650 mg by mouth at bedtime.    [name] amoxicillin-clavulanate (AUGMENTIN) 875-125 MG tablet Take 1 tablet by mouth 2 (two) times daily. 28 tablet 0  . aspirin 81 MG tablet Take 1 tablet (81 mg total) by mouth daily. 90 tablet 3  . cephALEXin (KEFLEX) 500 MG capsule Take 1 capsule (500 mg total) by mouth 3 (three) times daily. 30 capsule 0  . cetirizine (ZYRTEC) 10 MG tablet Take 10 mg by mouth at bedtime.    . [employer] D3 25 MCG (1000 UT) capsule TAKE 1 CAPSULE BY MOUTH EVERY DAY 90 capsule 3  . FLUoxetine (PROZAC) 40 MG capsule TAKE 1 CAPSULE BY MOUTH EVERY DAY 90 capsule 1  . hydroxyurea (HYDREA) 500 MG capsule Take 1 capsule (500 mg total) by mouth daily. May take with food to minimize GI side effects. 90 capsule 3  . ibuprofen (ADVIL,MOTRIN) 400 MG tablet Take 1 tablet (400 mg total) by mouth every 6 (six) hours as needed for fever, headache or mild pain. 30 tablet 0  . mupirocin ointment (BACTROBAN) 2 % Apply 1 application topically 3 (three) times daily. 22 g 0  . predniSONE (DELTASONE) 10 MG tablet Take 10 mg by mouth daily.    [name] saccharomyces boulardii (FLORASTOR) 250 MG capsule Take 1 capsule (250 mg total) by mouth 2 (two) times daily.    . sodium chloride (OCEAN) 0.65 % nasal spray Place 1 spray into the nose 4 (four) times daily as needed for congestion.     [name] terazosin (HYTRIN) 2 MG capsule TAKE 1 CAPSULE (2 MG TOTAL) BY MOUTH AT BEDTIME. 90 capsule 3   No current facility-administered medications for this visit.     OBJECTIVE:  Vitals:   01/18/19 0928  BP: (!) 123/51  [name]: 75  Resp: 18  Temp: (!) 97.5 F (36.4 C)  SpO2: 97%     Body mass index is 21.49 kg/m.    ECOG FS: 1 Filed Weights   01/18/19 0928  Weight: 167  lb 6.4 oz (75.9 kg)  GENERAL: Older male in wheelchair, appears fatigued HEENT:  Sclerae anicteric.  Oropharynx clear and moist. No ulcerations or evidence of oropharyngeal candidiasis. Neck is supple.  NODES:  No cervical, supraclavicular, or axillary lymphadenopathy palpated.  LUNGS:  Clear to auscultation bilaterally.  No wheezes or rhonchi. HEART:  Regular rate and rhythm. No murmur appreciated. ABDOMEN:  Soft, nontender.  Positive, normoactive bowel sounds. No organomegaly palpated. MSK:  No focal spinal tenderness to palpation.  EXTREMITIES:  No  peripheral edema.  Left ankle with open healing wound, no swelling, mild erythema surrounding, no tenderness, mild warmth SKIN:  Clear with no obvious rashes or skin changes. No nail dyscrasia. NEURO:  Nonfocal. Well oriented.  Appropriate affect.    LAB RESULTS:   Lab Results  Component Value Date   WBC 13.0 (H) 01/18/2019   NEUTROABS PENDING 01/18/2019   HGB 12.6 (L) 01/18/2019   HCT 39.4 01/18/2019   MCV 109.4 (H) 01/18/2019   PLT 465 (H) 01/18/2019      Chemistry      Component Value Date/Time   NA 142 11/14/2018 1411   NA 142 06/03/2017 1404   K 4.2 11/14/2018 1411   K 3.9 06/03/2017 1404   CL 108 11/14/2018 1411   CO2 27 11/14/2018 1411   CO2 26 06/03/2017 1404   BUN 28 (H) 11/14/2018 1411   BUN 19.9 06/03/2017 1404   CREATININE 1.72 (H) 11/14/2018 1411   CREATININE 1.70 (H) 10/17/2018 1051   CREATININE 1.6 (H) 06/03/2017 1404      Component Value Date/Time   CALCIUM 8.6 (L) 11/14/2018 1411   CALCIUM 8.9 06/03/2017 1404   ALKPHOS 74 11/14/2018 1411   ALKPHOS 91 06/03/2017 1404   AST 22 11/14/2018 1411   AST 23 10/17/2018 1051   AST 20 06/03/2017 1404   ALT 15 11/14/2018 1411   ALT 18 10/17/2018 1051   ALT 12 06/03/2017 1404   BILITOT 0.4 11/14/2018 1411   BILITOT 0.5 10/17/2018 1051   BILITOT 0.48 06/03/2017 1404       STUDIES: No results found.    ASSESSMENT: 85 y.o.  [place] man with   (1)  essential thrombocytosis initially diagnosed December 2003,  controlled on anagrelide 0.5 mg daily and aspirin 81 mg daily initially, stopped 06/2017  (a) no evidence of leukemic transformation  (b) Hydrea 560m daily starting in 07/2017  (2) anemia, secondary to chronic kidney disease, receiving aranesp 3078m every 4 weeks  (3) possible transition to myelofibrosis on bone marrow biopsy 06/22/2017,  (a) normal cytogenetics  (b) FISH WNL   PLAN: Jjesus's hemoglobin is improving.  I do not think that his unwell feeling has anything to do with the phlebotomy and blood transfusion he received last week.  It is likely related to his cellulitis and antibiotic regimen.  We will get plain films of his ankle today, and he should likely feel better in 3 days once he completes the augmentin.    Should he develop any new or worsening symptoms, I have asked his daughter to please let usKoreanow.    The patient met with Dr. MaJana Hakims well.  The above plan was reviewed in detail.  He will return on 8/25 for labs, f/u, and his injection.  She was recommended to continue with the appropriate pandemic precautions.  he knows to call for any questions that may arise between now and his next appointment.  We are happy to see him sooner if needed.   LiWilber BihariNP  01/18/19 9:51 AM Medical Oncology and Hematology CoU[facility] [id number]. [phone]  Fax. [phone] ADDENDUM: I met with Satoru and his daughter today and again apologized for the mishap recently.  He tolerated the transfusions without any side effects and is pretty much back to baseline as far as his counts are concerned.  Even before that problem though he had been feeling poorly.  After much discussion today it  appears its the antibiotics he had been receiving.  He is just completing those so I anticipate in the next few days he will be feeling better  Otherwise we are continuing  treatment as before.  Generally his counts have been very tolerable and he maintains his good a functional status as possible given his age and other comorbidities.  I personally saw this patient and performed a substantive portion of this encounter with the listed APP documented above.   [name], MD Medical Oncology and Hematology [facility] [address] [place], [place] [address] Tel. [phone]    Fax. [phone]

## 2019-01-19 ENCOUNTER — Encounter: Payer: Self-pay | Admitting: Adult Health

## 2019-01-22 ENCOUNTER — Telehealth: Payer: Self-pay

## 2019-01-22 NOTE — Telephone Encounter (Signed)
Spoke with [name], patient's daughter, to inform her that there is no infection of the bone seen on x-rays.  Daughter voiced understanding and thanks for call.

## 2019-01-22 NOTE — Telephone Encounter (Signed)
-----   Message from Gardenia Phlegm, NP sent at 01/19/2019  7:37 AM EDT ----- Please call patient daughter [name].  No infection of bone seen on xrays.   ----- Message ----- From: Interface, Rad Results In Sent: 01/18/2019   3:56 PM EDT To: Gardenia Phlegm, NP

## 2019-02-05 NOTE — Progress Notes (Signed)
[place]  [phone] [phone]   ID: Nicholas [name].   DOB: February 14, 1928  MR#: [id number]  WCB#:[id number]  Patient Care Team: Denita Lung, MD as PCP - General (Family Medicine) [place]me[place] Filice, Virgie Dad, MD as Consulting Physician (Oncology) Tysinger, Camelia Eng, PA-C as Physician Assistant (Family Medicine)  OTHERS: Dr. Posey Pronto (eye doctor)   CHIEF COMPLAINT:  Essential thrombocytosis and chronic anemia  CURRENT TREATMENT: hydroxyurea; Aranesp   INTERVAL HISTORY: Ceylon returns today for follow-up and treatment of his myeloproliferative disorder. He was last seen here on 01/18/2019.  He is accompanied by his daughter (who incidentally works in primary care, and Dr. Lanice Shirts office).  He continues on hydroxyurea, 553m daily.  He reports no side effects from this medication  She also continues on aranesp.  His last dose was 4 weeks ago.  He has had no issues with this.  Since his last visit here, he underwent a left ankle xray on 01/18/2019 showing: Calcaneal spurring. No acute osseous abnormalities.   REVIEW OF SYSTEMS: Javier tells me he has felt weak since today we removed some blood instead of giving him some.  Of course he was transfused the next day and his hemoglobin now is excellent but nevertheless he still feels tired.  He walks to the mailbox.  He does not use the cane.  He denies balance issues or falls but he does have macular degeneration which is a concern.  At home is just him and his wife who is significantly demented.  They are taking appropriate pandemic precautions with his daughters helping them out in terms of shopping, etc.  Aside from these issues a detailed review of systems today was stable.   PAST MEDICAL HISTORY: Past Medical History:  Diagnosis Date  . Abnormal CT scan, sinus 10/04/06   pansinusitis, mildly deviated septum  . Allergy   . Anemia    related to CKD  . Chronic sinusitis   . Dysthymia    dysthymia  .  H/O echocardiogram 01/04/2007   LV function normal, 55-60% EF, mildly increased thickenss of LV wall  . H/O echocardiogram 12/2006   mild LV wall thickening, EF 55-60%, LV function normal  . History of MRI of brain and brain stem 02/26/06   sinusitis, nonspecific deep white matter changes normal for age  . Hypertension   . Seborrheic keratosis    sees HToni Arthurs NP dermatology  . Thrombocytosis ([facility]    sees hematology    PAST SURGICAL HISTORY: Past Surgical History:  Procedure Laterality Date  . HERNIA REPAIR     right inguinal repair  . I&D EXTREMITY Right 09/16/2017   Procedure: IRRIGATION AND DEBRIDEMENT EXTREMITY;  Surgeon: GRoseanne Kaufman MD;  [facility]  Service: Orthopedics;  Laterality: Right;  . TONSILLECTOMY     childhood  . VARICOSE VEIN SURGERY     right lower leg    FAMILY HISTORY Family History  Problem Relation Age of Onset  . Heart disease Father   . Stroke Father   . Cancer Neg Hx   . COPD Neg Hx   . Diabetes Neg Hx     SOCIAL HISTORY:  (Reviewed 11/28/2013) The patient lives with his wife, who suffers from diabetes and emphysema and has had some small strokes. She is able to care for herself. He gives her her insulin shots. He has 2 daughters, SBarbera Setterswho lives in [place] away and [place]ancho [place]who [place]  [place] .The patient has 3 grandchildren.[name]   ADVANCED DIRECTIVES: the patient tells me his daughter Keigo Whalley would make health care decisions if he is incapacitated    HEALTH MAINTENANCE: (Updated 11/28/2013) Social History   Tobacco Use  . Smoking status: Former Smoker    Quit date: 06/14/1958    Years since quitting: 60.6  . Smokeless tobacco: Never Used  Substance Use Topics  . Alcohol use: No  . Drug use: No     Colonoscopy:  Not on file  PSA: Not on file  Bone density: Never  Lipid panel: October 2014  Allergies  Allergen Reactions  . Erythromycin Other (See Comments)    Gums turned red  and bled profusely  . Tape Other (See Comments)    The patient's SKIN IS VERY THIN AND TEARS AND BRUISES VERY EASILY!!    Current Outpatient Medications  Medication Sig Dispense Refill  . acetaminophen (TYLENOL) 650 MG CR tablet Take 650 mg by mouth at bedtime.    [name] amoxicillin-clavulanate (AUGMENTIN) 875-125 MG tablet Take 1 tablet by mouth 2 (two) times daily. 28 tablet 0  . aspirin 81 MG tablet Take 1 tablet (81 mg total) by mouth daily. 90 tablet 3  . cephALEXin (KEFLEX) 500 MG capsule Take 1 capsule (500 mg total) by mouth 3 (three) times daily. 30 capsule 0  . cetirizine (ZYRTEC) 10 MG tablet Take 10 mg by mouth at bedtime.    . [facility]S D3 25 MCG (1000 UT) capsule TAKE 1 CAPSULE BY MOUTH EVERY DAY 90 capsule 3  . FLUoxetine (PROZAC) 40 MG capsule TAKE 1 CAPSULE BY MOUTH EVERY DAY 90 capsule 1  . hydroxyurea (HYDREA) 500 MG capsule Take 1 capsule (500 mg total) by mouth daily. May take with food to minimize GI side effects. 90 capsule 3  . ibuprofen (ADVIL,MOTRIN) 400 MG tablet Take 1 tablet (400 mg total) by mouth every 6 (six) hours as needed for fever, headache or mild pain. 30 tablet 0  . mupirocin ointment (BACTROBAN) 2 % Apply 1 application topically 3 (three) times daily. 22 g 0  . predniSONE (DELTASONE) 10 MG tablet Take 10 mg by mouth daily.    [name] saccharomyces boulardii (FLORASTOR) 250 MG capsule Take 1 capsule (250 mg total) by mouth 2 (two) times daily.    . sodium chloride (OCEAN) 0.65 % nasal spray Place 1 spray into the nose 4 (four) times daily as needed for congestion.     [name] terazosin (HYTRIN) 2 MG capsule TAKE 1 CAPSULE (2 MG TOTAL) BY MOUTH AT BEDTIME. 90 capsule 3   No current facility-administered medications for this visit.     OBJECTIVE: Older white man examined in a wheelchair Vitals:   02/06/19 1437  BP: 135/61  Pulse: 65  Resp: 17  Temp: 97.8 F (36.6 C)  SpO2: 97%    Body mass index is 21.39 kg/m.  Filed Weights   02/06/19 1437  Weight: 166 lb 9 oz  (75.6 kg)     ECOG FS:2 - Symptomatic, <50% confined to bed  Sclerae unicteric, EOMs intact Wearing a mask No cervical or supraclavicular adenopathy Lungs no rales or rhonchi Heart regular rate and rhythm Abd soft, nontender, positive bowel sounds MSK no focal spinal tenderness, no upper extremity lymphedema Neuro: nonfocal, well oriented, appropriate affect   LAB RESULTS:  CBC    Component Value Date/Time   WBC 12.6 (H) 02/06/2019 1351   RBC 3.26 (L) 02/06/2019 1351   HGB 11.2 (L) 02/06/2019 1351  HGB 12.6 (L) 01/18/2019 0916   HGB 8.7 (L) 06/03/2017 1404   HCT 35.1 (L) 02/06/2019 1351   HCT 27.6 (L) 06/03/2017 1404   PLT 356 02/06/2019 1351   PLT 465 (H) 01/18/2019 0916   PLT 415 (H) 06/03/2017 1404   MCV 107.7 (H) 02/06/2019 1351   MCV 103.8 (H) 06/03/2017 1404   MCH 34.4 (H) 02/06/2019 1351   MCHC 31.9 02/06/2019 1351   RDW 17.9 (H) 02/06/2019 1351   RDW 20.5 (H) 06/03/2017 1404   LYMPHSABS 2.3 02/06/2019 1351   LYMPHSABS 2.7 06/03/2017 1404   MONOABS 1.3 (H) 02/06/2019 1351   MONOABS 2.2 (H) 06/03/2017 1404   EOSABS 0.0 02/06/2019 1351   EOSABS 0.1 06/03/2017 1404   BASOSABS 0.2 (H) 02/06/2019 1351   BASOSABS 0.5 (H) 06/03/2017 1404      Chemistry      Component Value Date/Time   NA 142 01/18/2019 0916   NA 142 06/03/2017 1404   K 4.0 01/18/2019 0916   K 3.9 06/03/2017 1404   CL 108 01/18/2019 0916   CO2 21 (L) 01/18/2019 0916   CO2 26 06/03/2017 1404   BUN 24 (H) 01/18/2019 0916   BUN 19.9 06/03/2017 1404   CREATININE 1.70 (H) 01/18/2019 0916   CREATININE 1.6 (H) 06/03/2017 1404      Component Value Date/Time   CALCIUM 9.1 01/18/2019 0916   CALCIUM 8.9 06/03/2017 1404   ALKPHOS 75 01/18/2019 0916   ALKPHOS 91 06/03/2017 1404   AST 22 01/18/2019 0916   AST 20 06/03/2017 1404   ALT 16 01/18/2019 0916   ALT 12 06/03/2017 1404   BILITOT 0.5 01/18/2019 0916   BILITOT 0.48 06/03/2017 1404       STUDIES: Dg Ankle Complete Left  Result  Date: 01/18/2019 CLINICAL DATA:  Cellulitis LEFT lateral ankle, 2 months, evaluate for osteomyelitis nonhealing wound for EXAM: LEFT ANKLE COMPLETE - 3+ VIEW COMPARISON:  None FINDINGS: Osseous demineralization. Joint spaces preserved. No acute fracture, dislocation, or bone destruction. Specifically, no bone destruction or periosteal reaction are identified at the lateral malleolus. Plantar calcaneal spur. Scattered small vessel vascular calcifications of lower leg into foot. IMPRESSION: Calcaneal spurring. No acute osseous abnormalities. Electronically Signed   By: Lavonia Dana M.D.   On: 01/18/2019 15:53      ASSESSMENT: 85 y.o.  [place] man with   (1) essential thrombocytosis initially diagnosed December 2003,  controlled on anagrelide 0.5 mg daily and aspirin 81 mg daily initially, stopped 06/2017  (a) no evidence of leukemic transformation  (b) Hydrea 556m daily starting in 07/2017  (2) anemia, secondary to chronic kidney disease, receiving aranesp 3044m every 4 weeks  (3) possible transition to myelofibrosis on bone marrow biopsy 06/22/2017,  (a) normal cytogenetics  (b) FISH WNL   PLAN: Allyn anemia is considerably better.  His platelet count is very well controlled.  I am making no change in his treatment.  I do not know why he would be feeling so tired.  It is rather hot out there.  He is able to walk to his mailbox.  I have urged her to use his cane to avoid falls.  Aside from that the more he walks the better.  The weather is about to change in his should be a little bit more comfortable for him in that regard.  I am delighted with his current hemoglobin and we are going to not check again for another 6weeks to minimize exposure to the coronavirus.  I discussed  with his daughter whether perhaps some of his medications like the Prozac for example could be cut in half and dose to see if that makes a difference to his functional status  He knows to call for any issue that may  develop before the next visit.  Kimia Finan, Virgie Dad, MD  02/06/19 3:04 PM Medical Oncology and Hematology [facility] [address] [place], [place] [address] Tel. [phone]    Fax. [phone]  I, Jacqualyn Posey am acting as a Education administrator for [name], MD.   I, Lurline Del MD, have reviewed the above documentation for accuracy and completeness, and I agree with the above.

## 2019-02-06 ENCOUNTER — Inpatient Hospital Stay (HOSPITAL_BASED_OUTPATIENT_CLINIC_OR_DEPARTMENT_OTHER): Payer: Medicare Other | Admitting: Oncology

## 2019-02-06 ENCOUNTER — Ambulatory Visit: Payer: Medicare Other

## 2019-02-06 ENCOUNTER — Other Ambulatory Visit: Payer: Self-pay

## 2019-02-06 ENCOUNTER — Inpatient Hospital Stay: Payer: Medicare Other

## 2019-02-06 VITALS — BP 135/61 | HR 65 | Temp 97.8°F | Resp 17 | Ht 74.0 in | Wt 166.6 lb

## 2019-02-06 DIAGNOSIS — D473 Essential (hemorrhagic) thrombocythemia: Secondary | ICD-10-CM | POA: Diagnosis not present

## 2019-02-06 DIAGNOSIS — D631 Anemia in chronic kidney disease: Secondary | ICD-10-CM

## 2019-02-06 DIAGNOSIS — N183 Chronic kidney disease, stage 3 unspecified: Secondary | ICD-10-CM

## 2019-02-06 DIAGNOSIS — R161 Splenomegaly, not elsewhere classified: Secondary | ICD-10-CM | POA: Diagnosis not present

## 2019-02-06 DIAGNOSIS — N189 Chronic kidney disease, unspecified: Secondary | ICD-10-CM | POA: Diagnosis not present

## 2019-02-06 DIAGNOSIS — M25572 Pain in left ankle and joints of left foot: Secondary | ICD-10-CM | POA: Diagnosis not present

## 2019-02-06 DIAGNOSIS — R296 Repeated falls: Secondary | ICD-10-CM | POA: Diagnosis not present

## 2019-02-06 DIAGNOSIS — R5383 Other fatigue: Secondary | ICD-10-CM | POA: Diagnosis not present

## 2019-02-06 DIAGNOSIS — Z7982 Long term (current) use of aspirin: Secondary | ICD-10-CM | POA: Diagnosis not present

## 2019-02-06 LAB — CBC WITH DIFFERENTIAL/PLATELET
Abs Immature Granulocytes: 0.8 10*3/uL — ABNORMAL HIGH (ref 0.00–0.07)
Basophils Absolute: 0.2 10*3/uL — ABNORMAL HIGH (ref 0.0–0.1)
Basophils Relative: 2 %
Eosinophils Absolute: 0 10*3/uL (ref 0.0–0.5)
Eosinophils Relative: 0 %
HCT: 35.1 % — ABNORMAL LOW (ref 39.0–52.0)
Hemoglobin: 11.2 g/dL — ABNORMAL LOW (ref 13.0–17.0)
Immature Granulocytes: 6 %
Lymphocytes Relative: 18 %
Lymphs Abs: 2.3 10*3/uL (ref 0.7–4.0)
MCH: 34.4 pg — ABNORMAL HIGH (ref 26.0–34.0)
MCHC: 31.9 g/dL (ref 30.0–36.0)
MCV: 107.7 fL — ABNORMAL HIGH (ref 80.0–100.0)
Monocytes Absolute: 1.3 10*3/uL — ABNORMAL HIGH (ref 0.1–1.0)
Monocytes Relative: 10 %
Neutro Abs: 8 10*3/uL — ABNORMAL HIGH (ref 1.7–7.7)
Neutrophils Relative %: 64 %
Platelets: 356 10*3/uL (ref 150–400)
RBC: 3.26 MIL/uL — ABNORMAL LOW (ref 4.22–5.81)
RDW: 17.9 % — ABNORMAL HIGH (ref 11.5–15.5)
WBC: 12.6 10*3/uL — ABNORMAL HIGH (ref 4.0–10.5)
nRBC: 0 % (ref 0.0–0.2)

## 2019-02-07 ENCOUNTER — Telehealth: Payer: Self-pay | Admitting: Oncology

## 2019-02-07 ENCOUNTER — Other Ambulatory Visit: Payer: Self-pay | Admitting: Medical

## 2019-02-07 MED ORDER — FLUOXETINE HCL 10 MG PO CAPS: 10.0000 mg | ORAL_CAPSULE | Freq: Every day | ORAL | 1 refills | Status: DC

## 2019-02-07 MED ORDER — FLUOXETINE HCL 20 MG PO CAPS: 20.0000 mg | ORAL_CAPSULE | Freq: Every day | ORAL | 1 refills | Status: DC

## 2019-02-07 NOTE — Telephone Encounter (Signed)
I talk with patients daughter

## 2019-02-23 ENCOUNTER — Other Ambulatory Visit: Payer: Self-pay

## 2019-02-23 ENCOUNTER — Encounter: Payer: Self-pay | Admitting: Medical

## 2019-02-23 ENCOUNTER — Ambulatory Visit (INDEPENDENT_AMBULATORY_CARE_PROVIDER_SITE_OTHER): Payer: Medicare Other | Admitting: Medical

## 2019-02-23 VITALS — BP 120/66 | HR 74 | Temp 98.2°F | Wt 168.8 lb

## 2019-02-23 DIAGNOSIS — R531 Weakness: Secondary | ICD-10-CM | POA: Diagnosis not present

## 2019-02-23 DIAGNOSIS — N183 Chronic kidney disease, stage 3 unspecified: Secondary | ICD-10-CM

## 2019-02-23 DIAGNOSIS — R5383 Other fatigue: Secondary | ICD-10-CM | POA: Diagnosis not present

## 2019-02-23 DIAGNOSIS — R7989 Other specified abnormal findings of blood chemistry: Secondary | ICD-10-CM | POA: Diagnosis not present

## 2019-02-23 DIAGNOSIS — J309 Allergic rhinitis, unspecified: Secondary | ICD-10-CM | POA: Diagnosis not present

## 2019-02-23 DIAGNOSIS — D631 Anemia in chronic kidney disease: Secondary | ICD-10-CM

## 2019-02-23 DIAGNOSIS — J3489 Other specified disorders of nose and nasal sinuses: Secondary | ICD-10-CM

## 2019-02-23 DIAGNOSIS — H3530 Unspecified macular degeneration: Secondary | ICD-10-CM

## 2019-02-23 DIAGNOSIS — R51 Headache: Secondary | ICD-10-CM

## 2019-02-23 DIAGNOSIS — R32 Unspecified urinary incontinence: Secondary | ICD-10-CM

## 2019-02-23 DIAGNOSIS — I10 Essential (primary) hypertension: Secondary | ICD-10-CM | POA: Diagnosis not present

## 2019-02-23 DIAGNOSIS — R2689 Other abnormalities of gait and mobility: Secondary | ICD-10-CM

## 2019-02-23 DIAGNOSIS — F341 Dysthymic disorder: Secondary | ICD-10-CM

## 2019-02-23 DIAGNOSIS — D692 Other nonthrombocytopenic purpura: Secondary | ICD-10-CM | POA: Diagnosis not present

## 2019-02-23 DIAGNOSIS — R519 Headache, unspecified: Secondary | ICD-10-CM | POA: Insufficient documentation

## 2019-02-23 DIAGNOSIS — D473 Essential (hemorrhagic) thrombocythemia: Secondary | ICD-10-CM

## 2019-02-23 LAB — POCT URINALYSIS DIP (PROADVANTAGE DEVICE)
Bilirubin, UA: NEGATIVE
Blood, UA: NEGATIVE
Glucose, UA: NEGATIVE mg/dL
Ketones, POC UA: NEGATIVE mg/dL
Leukocytes, UA: NEGATIVE
Nitrite, UA: NEGATIVE
Specific Gravity, Urine: 1.015
Urobilinogen, Ur: NEGATIVE
pH, UA: 7 (ref 5.0–8.0)

## 2019-02-23 NOTE — Progress Notes (Signed)
Subjective: Chief Complaint  Patient presents with  . Fatigue  . Blurred Vision  . Sinus Problem    chronic sinus worsening -headache    Here for concerns.   Here with daughter.  Lately just feels unwell, fatigued, possible sinus issues.   Recently on the recommendation of oncology we lowered his prozac from 40mg  down to 30mg  daily.   No other recent medication changes.  He notes that he hasn't felt good since he had accidental phlebotomy through [facility] office about 1.5 months ago.  Feels weak in general, trips as he doesn't have energy to walk. Sinuses have gotten worse, worse pressure, blowing nose constantly, using nasal spray BID.  Having some extra headaches of late, attributes to sinuses.   Ankle still hurts from prior ulceration first noted months ago, worse if brushing by something.   Lying in bed the leg gets to hurting.   No dyspnea, no chest pain.   Had negative covid test over a month ago.  Urinary incontinence 1-2 years.  Has hx/o falls, has shuffled gait for a while now per daughter.  No confusion, no concerns for memory.  Still takes care of his wife.   He has vision problems in left eye, was suppose to have surgery per eye doctor but covid delayed this.  hasn't been able to see out of left eye in months.   Former smoker, quit in 1960s.  Other than urinary incontinence, no dysuria, no burning, no odor.   No bowel c/o.   No edema.  No numbness, no tingling. No recent diet changes.  No other aggravating or relieving factors. No other complaint.   Past Medical History:  Diagnosis Date  . Abnormal CT scan, sinus 10/04/06   pansinusitis, mildly deviated septum  . Allergy   . Anemia    related to CKD  . Chronic sinusitis   . Dysthymia    dysthymia  . H/O echocardiogram 01/04/2007   LV function normal, 55-60% EF, mildly increased thickenss of LV wall  . H/O echocardiogram 12/2006   mild LV wall thickening, EF 55-60%, LV function normal  . History of MRI of brain and brain stem 02/26/06    sinusitis, nonspecific deep white matter changes normal for age  . Hypertension   . Seborrheic keratosis    sees Toni Arthurs, NP dermatology  . Thrombocytosis [facility])    sees hematology   Current Outpatient Medications on File Prior to Visit  Medication Sig Dispense Refill  . acetaminophen (TYLENOL) 650 MG CR tablet Take 650 mg by mouth at bedtime.    [name] aspirin 81 MG tablet Take 1 tablet (81 mg total) by mouth daily. 90 tablet 3  . cetirizine (ZYRTEC) 10 MG tablet Take 10 mg by mouth at bedtime.    . CVS D3 25 MCG (1000 UT) capsule TAKE 1 CAPSULE BY MOUTH EVERY DAY 90 capsule 3  . FLUoxetine (PROZAC) 10 MG capsule Take 1 capsule (10 mg total) by mouth daily. 30 capsule 1  . FLUoxetine (PROZAC) 20 MG capsule Take 1 capsule (20 mg total) by mouth daily. 30 capsule 1  . hydroxyurea (HYDREA) 500 MG capsule Take 1 capsule (500 mg total) by mouth daily. May take with food to minimize GI side effects. 90 capsule 3  . saccharomyces boulardii (FLORASTOR) 250 MG capsule Take 1 capsule (250 mg total) by mouth 2 (two) times daily.    . sodium chloride (OCEAN) 0.65 % nasal spray Place 1 spray into the nose 4 (four) times  daily as needed for congestion.     [name] terazosin (HYTRIN) 2 MG capsule TAKE 1 CAPSULE (2 MG TOTAL) BY MOUTH AT BEDTIME. 90 capsule 3   No current facility-administered medications on file prior to visit.    ROS as in subjective    Objective: BP 120/66   Pulse 74   Temp 98.2 F (36.8 C)   Wt 168 lb 12.8 oz (76.6 kg)   SpO2 95%   BMI 21.67 kg/m   Wt Readings from Last 3 Encounters:  02/23/19 168 lb 12.8 oz (76.6 kg)  02/06/19 166 lb 9 oz (75.6 kg)  01/18/19 167 lb 6.4 oz (75.9 kg)   05/09/18 weight was 172 lb  Gen: wd, wn, nad Psych: pleasant, answers questions appropriately Neuro: shuffled gait, seems somewhat unsteady on feet, some ptosis of left eye, but is able to open and close eye when prompted, but otherwise CN2-12 intact, A&O x 3, normal finger to nose, no  other focal deficit Heart rrr, normal s1, s2, no murmur Lungs clear Abdomen nontender, no mass, no organomegaly Left lateral distal leg over ankle with crusted 1.5cm area from prior ulceration, serous crusting present, no erythema or swelling Skin: senile purpura of both forearms, thin skin in general, SKs throughout back and upper anterior torso Pulses 2+ ue and le No ext edema   CT abdomen/pelvis 07/26/2017  IMPRESSION: 1. Mild-to-moderate splenomegaly with scattered indeterminate low-density lesions, probably related to the patient's myelofibrosis. 2. No focal or acute osseous findings. 3. Multiple hepatic and renal cysts. Bilateral renal cortical thinning. 4. Additional incidental findings including sigmoid diverticulosis, prostatomegaly, possible gallstones or sludge, and Aortic Atherosclerosis (ICD10-I70.0) and Emphysema (ICD10-J43.9).    Assessment: Encounter Diagnoses  Name Primary?  . Weakness Yes  . Urinary incontinence, unspecified type   . Impaired gait and mobility   . Nonintractable headache, unspecified chronicity pattern, unspecified headache type   . Sinus pressure   . Fatigue, unspecified type   . Essential hypertension   . Senile purpura ([place])   . Allergic rhinitis, unspecified seasonality, unspecified trigger   . CKD (chronic kidney disease) stage 3, GFR 30-59 ml/min (HCC)   . Essential thrombocytosis ([place])   . Dysthymia   . Anemia in stage 3 chronic kidney disease ([place])   . Macular degeneration, unspecified laterality, unspecified type      Plan: Discussed his symptoms, concerns with him and daughter.    I reviewed his 01/2018 cardiology consult for falls, and at that time Dr. Gwenlyn Found felt there was no cardiac explanation for multiple falls.   I reviewed his 02/06/2019 oncology note.  At that time it was recommended for [place] to reduce his antidepressant dose to see if this would help his symptoms of fatigue and weakness.  His platelet count and labs are  all to be stable at that time.  He has been using a walker around the house and cane to help avoid falls.  No recent diet changes.  He reports that his fatigue and weakness symptoms were going on even before the change in antidepressant dose.  We discussed possible evaluation at this point including additional labs, urinalysis, urine culture, EKG, possibly head scan, possibly other  Consider nephrology consult  Has been on Prozac 30mg  instead of 40mg  about 2 weeks    Nicholas [name] was seen today for fatigue, blurred vision and sinus problem.  Diagnoses and all orders for this visit:  Weakness -     CBC with Differential/Platelet -     Comprehensive  metabolic panel -     Vitamin B12 -     TSH -     Urine Culture -     Novel Coronavirus, NAA ([facility]) -     EKG 12-Lead  Urinary incontinence, unspecified type -     Urine Culture -     POCT Urinalysis DIP (Proadvantage Device)  Impaired gait and mobility  Nonintractable headache, unspecified chronicity pattern, unspecified headache type  Sinus pressure -     Novel Coronavirus, NAA ([facility])  Fatigue, unspecified type -     Vitamin B12 -     Novel Coronavirus, NAA ([facility])  Essential hypertension  Senile purpura (HCC)  Allergic rhinitis, unspecified seasonality, unspecified trigger  CKD (chronic kidney disease) stage 3, GFR 30-59 ml/min (HCC) -     CBC with Differential/Platelet -     Comprehensive metabolic panel  Essential thrombocytosis (HCC)  Dysthymia -     Vitamin B12  Anemia in stage 3 chronic kidney disease (HCC) -     Comprehensive metabolic panel -     Vitamin B12  Macular degeneration, unspecified laterality, unspecified type  Other orders -     Immature Cells   F/u pending labs

## 2019-02-24 ENCOUNTER — Other Ambulatory Visit: Payer: Self-pay | Admitting: Medical

## 2019-02-24 DIAGNOSIS — F32A Depression, unspecified: Secondary | ICD-10-CM

## 2019-02-24 DIAGNOSIS — F329 Major depressive disorder, single episode, unspecified: Secondary | ICD-10-CM

## 2019-02-25 LAB — COMPREHENSIVE METABOLIC PANEL
ALT: 20 IU/L (ref 0–44)
AST: 26 IU/L (ref 0–40)
Albumin/Globulin Ratio: 1.4 (ref 1.2–2.2)
Albumin: 4.1 g/dL (ref 3.6–4.6)
Alkaline Phosphatase: 86 IU/L (ref 39–117)
BUN/Creatinine Ratio: 18 (ref 10–24)
BUN: 26 mg/dL (ref 8–27)
Bilirubin Total: 0.4 mg/dL (ref 0.0–1.2)
CO2: 22 mmol/L (ref 20–29)
Calcium: 8.8 mg/dL (ref 8.6–10.2)
Chloride: 105 mmol/L (ref 96–106)
Creatinine, Ser: 1.42 mg/dL — ABNORMAL HIGH (ref 0.76–1.27)
GFR calc Af Amer: 51 mL/min/{1.73_m2} — ABNORMAL LOW (ref >59)
GFR calc non Af Amer: 44 mL/min/{1.73_m2} — ABNORMAL LOW (ref >59)
Globulin, Total: 2.9 g/dL (ref 1.5–4.5)
Glucose: 90 mg/dL (ref 65–99)
Potassium: 4.6 mmol/L (ref 3.5–5.2)
Sodium: 140 mmol/L (ref 134–144)
Total Protein: 7 g/dL (ref 6.0–8.5)

## 2019-02-25 LAB — CBC WITH DIFFERENTIAL/PLATELET
Basophils Absolute: 0.4 10*3/uL — ABNORMAL HIGH (ref 0.0–0.2)
Basos: 3 %
EOS (ABSOLUTE): 0 10*3/uL (ref 0.0–0.4)
Eos: 0 %
Hematocrit: 32.8 % — ABNORMAL LOW (ref 37.5–51.0)
Hemoglobin: 11.4 g/dL — ABNORMAL LOW (ref 13.0–17.7)
Lymphocytes Absolute: 2.7 10*3/uL (ref 0.7–3.1)
Lymphs: 19 %
MCH: 35.4 pg — ABNORMAL HIGH (ref 26.6–33.0)
MCHC: 34.8 g/dL (ref 31.5–35.7)
MCV: 102 fL — ABNORMAL HIGH (ref 79–97)
Monocytes Absolute: 1.1 10*3/uL — ABNORMAL HIGH (ref 0.1–0.9)
Monocytes: 8 %
NRBC: 1 % — ABNORMAL HIGH (ref 0–0)
Neutrophils Absolute: 8.7 10*3/uL — ABNORMAL HIGH (ref 1.4–7.0)
Neutrophils: 62 %
Platelets: 346 10*3/uL (ref 150–450)
RBC: 3.22 x10E6/uL — ABNORMAL LOW (ref 4.14–5.80)
RDW: 16.8 % — ABNORMAL HIGH (ref 11.6–15.4)
WBC: 14.1 10*3/uL — ABNORMAL HIGH (ref 3.4–10.8)

## 2019-02-25 LAB — IMMATURE CELLS
MYELOCYTES: 4 % — ABNORMAL HIGH (ref 0–0)
Metamyelocytes: 4 % — ABNORMAL HIGH (ref 0–0)

## 2019-02-25 LAB — TSH: TSH: 5.82 u[IU]/mL — ABNORMAL HIGH (ref 0.450–4.500)

## 2019-02-25 LAB — VITAMIN B12: Vitamin B-12: 1576 pg/mL — ABNORMAL HIGH (ref 232–1245)

## 2019-02-25 LAB — NOVEL CORONAVIRUS, NAA: SARS-CoV-2, NAA: NOT DETECTED

## 2019-02-26 ENCOUNTER — Other Ambulatory Visit: Payer: Self-pay | Admitting: Medical

## 2019-02-26 MED ORDER — CIPROFLOXACIN HCL 500 MG PO TABS: 500.0000 mg | ORAL_TABLET | Freq: Two times a day (BID) | ORAL | 0 refills | Status: DC

## 2019-02-27 LAB — URINE CULTURE

## 2019-02-28 LAB — SPECIMEN STATUS REPORT

## 2019-02-28 LAB — T4, FREE: Free T4: 0.96 ng/dL (ref 0.82–1.77)

## 2019-03-01 ENCOUNTER — Other Ambulatory Visit: Payer: Self-pay | Admitting: Medical

## 2019-03-07 ENCOUNTER — Other Ambulatory Visit: Payer: Self-pay | Admitting: Medical

## 2019-03-07 ENCOUNTER — Telehealth: Payer: Self-pay | Admitting: Medical

## 2019-03-07 DIAGNOSIS — R5383 Other fatigue: Secondary | ICD-10-CM

## 2019-03-07 DIAGNOSIS — N3941 Urge incontinence: Secondary | ICD-10-CM

## 2019-03-07 DIAGNOSIS — F32A Depression, unspecified: Secondary | ICD-10-CM

## 2019-03-07 DIAGNOSIS — F329 Major depressive disorder, single episode, unspecified: Secondary | ICD-10-CM

## 2019-03-07 DIAGNOSIS — N390 Urinary tract infection, site not specified: Secondary | ICD-10-CM

## 2019-03-07 MED ORDER — CIPROFLOXACIN HCL 500 MG PO TABS: 500.0000 mg | ORAL_TABLET | Freq: Two times a day (BID) | ORAL | 0 refills | Status: AC

## 2019-03-07 NOTE — Telephone Encounter (Signed)
At this point, can resume another round of Cipro.  If desired now or if not improved over the next week on antibiotic, the next step for me would be to repeat a blood count and to do blood cultures and repeat urine.  The blood cultures would be helpful to identify any infection bloodstream since the symptoms persist  If they want to go ahead and do some of this lab now as the blood cultures take several days, I will put these orders in for lab draw

## 2019-03-07 NOTE — Telephone Encounter (Signed)
Pt states that he is not any better. Still feeling tired. Pt's daughter requesting refills on antibiotic. Please send to [address]. He has today and then will be out.   ALSO pt needs refills on new dose of prozac. Also send to [facility] [address]

## 2019-03-07 NOTE — Telephone Encounter (Signed)
I refilled the Cipro antibiotic.  Did he see any improvement while on this?  The 2 different milligram doses of Prozac seem to have refills.  Dr. Redmond School sent them out last week I believe for 90-day supply

## 2019-03-07 NOTE — Telephone Encounter (Signed)
See prior message. Lets start with CBC and urine repeat , begin 2nd round of Cipro  I spoke to Dr. Redmond School, and he felt we should hold off on blood cultures.  If he is sick enough to warrant blood cultures (fever, tachycardia, pale, rapid breath, then he should be hospitalized for evaluation)

## 2019-03-07 NOTE — Telephone Encounter (Signed)
No her states that he still feels bad. Says hes not any better.

## 2019-03-19 ENCOUNTER — Telehealth: Payer: Self-pay | Admitting: Medical

## 2019-03-19 ENCOUNTER — Other Ambulatory Visit: Payer: Self-pay | Admitting: Adult Health

## 2019-03-19 DIAGNOSIS — D473 Essential (hemorrhagic) thrombocythemia: Secondary | ICD-10-CM

## 2019-03-19 NOTE — Telephone Encounter (Signed)
Daughter [name] noted that he still doesn't feel improved.    Feels fatigue, not well, but no other specific symptoms.   No current urinary symptoms, no fever, no weight changes.   He will come by tomorrow for weight check, labs, repeat clean catch urine test.

## 2019-03-20 ENCOUNTER — Other Ambulatory Visit: Payer: Self-pay | Admitting: Adult Health

## 2019-03-20 ENCOUNTER — Inpatient Hospital Stay: Payer: Medicare Other

## 2019-03-20 ENCOUNTER — Inpatient Hospital Stay (HOSPITAL_BASED_OUTPATIENT_CLINIC_OR_DEPARTMENT_OTHER): Payer: Medicare Other | Admitting: Adult Health

## 2019-03-20 ENCOUNTER — Encounter: Payer: Self-pay | Admitting: Adult Health

## 2019-03-20 ENCOUNTER — Inpatient Hospital Stay: Payer: Medicare Other | Attending: Oncology

## 2019-03-20 ENCOUNTER — Other Ambulatory Visit: Payer: Self-pay

## 2019-03-20 VITALS — BP 118/52 | HR 79 | Temp 98.9°F | Resp 18 | Ht 74.0 in | Wt 168.6 lb

## 2019-03-20 DIAGNOSIS — R399 Unspecified symptoms and signs involving the genitourinary system: Secondary | ICD-10-CM

## 2019-03-20 DIAGNOSIS — D473 Essential (hemorrhagic) thrombocythemia: Secondary | ICD-10-CM | POA: Diagnosis not present

## 2019-03-20 DIAGNOSIS — N189 Chronic kidney disease, unspecified: Secondary | ICD-10-CM | POA: Insufficient documentation

## 2019-03-20 DIAGNOSIS — F329 Major depressive disorder, single episode, unspecified: Secondary | ICD-10-CM | POA: Diagnosis not present

## 2019-03-20 DIAGNOSIS — N390 Urinary tract infection, site not specified: Secondary | ICD-10-CM | POA: Diagnosis not present

## 2019-03-20 DIAGNOSIS — D631 Anemia in chronic kidney disease: Secondary | ICD-10-CM | POA: Insufficient documentation

## 2019-03-20 DIAGNOSIS — N3941 Urge incontinence: Secondary | ICD-10-CM | POA: Diagnosis not present

## 2019-03-20 DIAGNOSIS — R5383 Other fatigue: Secondary | ICD-10-CM | POA: Diagnosis not present

## 2019-03-20 LAB — CMP (CANCER CENTER ONLY)
ALT: 13 U/L (ref 0–44)
AST: 20 U/L (ref 15–41)
Albumin: 3.5 g/dL (ref 3.5–5.0)
Alkaline Phosphatase: 71 U/L (ref 38–126)
Anion gap: 9 (ref 5–15)
BUN: 32 mg/dL — ABNORMAL HIGH (ref 8–23)
CO2: 24 mmol/L (ref 22–32)
Calcium: 8.7 mg/dL — ABNORMAL LOW (ref 8.9–10.3)
Chloride: 109 mmol/L (ref 98–111)
Creatinine: 1.57 mg/dL — ABNORMAL HIGH (ref 0.61–1.24)
GFR, Est AFR Am: 45 mL/min — ABNORMAL LOW (ref >60)
GFR, Estimated: 39 mL/min — ABNORMAL LOW (ref >60)
Glucose, Bld: 126 mg/dL — ABNORMAL HIGH (ref 70–99)
Potassium: 4.3 mmol/L (ref 3.5–5.1)
Sodium: 142 mmol/L (ref 135–145)
Total Bilirubin: 0.5 mg/dL (ref 0.3–1.2)
Total Protein: 6.8 g/dL (ref 6.5–8.1)

## 2019-03-20 LAB — CBC WITH DIFFERENTIAL (CANCER CENTER ONLY)
Abs Immature Granulocytes: 0.82 10*3/uL — ABNORMAL HIGH (ref 0.00–0.07)
Basophils Absolute: 0.2 10*3/uL — ABNORMAL HIGH (ref 0.0–0.1)
Basophils Relative: 1 %
Eosinophils Absolute: 0 10*3/uL (ref 0.0–0.5)
Eosinophils Relative: 0 %
HCT: 31.2 % — ABNORMAL LOW (ref 39.0–52.0)
Hemoglobin: 10 g/dL — ABNORMAL LOW (ref 13.0–17.0)
Immature Granulocytes: 7 %
Lymphocytes Relative: 17 %
Lymphs Abs: 2 10*3/uL (ref 0.7–4.0)
MCH: 34.8 pg — ABNORMAL HIGH (ref 26.0–34.0)
MCHC: 32.1 g/dL (ref 30.0–36.0)
MCV: 108.7 fL — ABNORMAL HIGH (ref 80.0–100.0)
Monocytes Absolute: 1.3 10*3/uL — ABNORMAL HIGH (ref 0.1–1.0)
Monocytes Relative: 11 %
Neutro Abs: 7.2 10*3/uL (ref 1.7–7.7)
Neutrophils Relative %: 64 %
Platelet Count: 350 10*3/uL (ref 150–400)
RBC: 2.87 MIL/uL — ABNORMAL LOW (ref 4.22–5.81)
RDW: 18.7 % — ABNORMAL HIGH (ref 11.5–15.5)
WBC Count: 11.5 10*3/uL — ABNORMAL HIGH (ref 4.0–10.5)
nRBC: 0.5 % — ABNORMAL HIGH (ref 0.0–0.2)

## 2019-03-20 MED ORDER — DARBEPOETIN ALFA 300 MCG/0.6ML IJ SOSY
300.0000 ug | PREFILLED_SYRINGE | Freq: Once | INTRAMUSCULAR | Status: AC
  Administered 2019-03-20: 300 ug via SUBCUTANEOUS

## 2019-03-20 MED ORDER — DARBEPOETIN ALFA 300 MCG/0.6ML IJ SOSY
PREFILLED_SYRINGE | INTRAMUSCULAR | Status: AC
  Filled 2019-03-20: qty 0.6

## 2019-03-20 NOTE — Progress Notes (Signed)
Nicholas [name]  [phone] [phone]   ID: Nicholas [name].   DOB: 03-Aug-1927  MR#: [id number]  OEV#:[id number]  Patient Care Team: Denita Lung, MD as PCP - General (Family Medicine) Magrinat, Virgie Dad, MD as Consulting Physician (Oncology) Tysinger, Camelia Eng, PA-C as Physician Assistant (Family Medicine)  OTHERS: Dr. Posey Pronto (eye doctor)   CHIEF COMPLAINT:  Essential thrombocytosis and chronic anemia  CURRENT TREATMENT: hydroxyurea; Aranesp   INTERVAL HISTORY: Nicholas [name] returns today for follow-up and treatment of his myeloproliferative disorder. He was last seen here on 01/18/2019.  He is accompanied by his daughter (who incidentally works in primary care, and Dr. Lanice Shirts office).  He continues on hydroxyurea, 565m daily.  He reports no side effects from this medication  He is also receiving Aranesp, last received in 12/2018.  He tolerates this well.    REVIEW OF SYSTEMS: Nicholas [name] tells me that he has continued to feel fatigued. He has had two urinary tract infections, and is having urine tested today to see if he needs a different antibiotoic. He has undergone two rounds of Cipro and he notes that antibiotics are usually difficult for him to take. He does note that he has decreased some of his other medications to see if it would help the fatigue, but around the time he decreased them, he developed the UTI, which has made it difficult to evaluate.    Nicholas [name] denies any fever or chills.  He is without cough, shortness of breath, chest pain, palpitations, night sweats, weight loss, lymphadenopathy.  He has no nausea, vomiting, bowel/bladder changes, headaches, vision issues.  A detailed ROS was otherwise non contributory.    PAST MEDICAL HISTORY: Past Medical History:  Diagnosis Date  . Abnormal CT scan, sinus 10/04/06   pansinusitis, mildly deviated septum  . Allergy   . Anemia    related to CKD  . Chronic sinusitis   . Dysthymia    dysthymia   . H/O echocardiogram 01/04/2007   LV function normal, 55-60% EF, mildly increased thickenss of LV wall  . H/O echocardiogram 12/2006   mild LV wall thickening, EF 55-60%, LV function normal  . History of MRI of brain and brain stem 02/26/06   sinusitis, nonspecific deep white matter changes normal for age  . Hypertension   . Seborrheic keratosis    sees HToni Arthurs NP dermatology  . Thrombocytosis ([facility]    sees hematology    PAST SURGICAL HISTORY: Past Surgical History:  Procedure Laterality Date  . HERNIA REPAIR     right inguinal repair  . I&D EXTREMITY Right 09/16/2017   Procedure: IRRIGATION AND DEBRIDEMENT EXTREMITY;  Surgeon: GRoseanne Kaufman MD;  [facility] [facility]  Service: Orthopedics;  Laterality: Right;  . TONSILLECTOMY     childhood  . VARICOSE VEIN SURGERY     right lower leg    FAMILY HISTORY Family History  Problem Relation Age of Onset  . Heart disease Father   . Stroke Father   . Cancer Neg Hx   . COPD Neg Hx   . Diabetes Neg Hx     SOCIAL HISTORY:  (Reviewed 11/28/2013) The patient lives with his wife, who suffers from diabetes and emphysema and has had some small strokes. She is able to care for herself. He gives her her insulin shots. He has 2 daughters, SBarbera Setterswho lives in [place] and [place]who [place] with Dr [employer].The patient has 3 grandchildren..[name]  ADVANCED DIRECTIVES: the patient tells me his daughter Kaelin Bonelli would make health care decisions if he is incapacitated    HEALTH MAINTENANCE: (Updated 11/28/2013) Social History   Tobacco Use  . Smoking status: Former Smoker    Quit date: 06/14/1958    Years since quitting: 60.8  . Smokeless tobacco: Never Used  Substance Use Topics  . Alcohol use: No  . Drug use: No     Colonoscopy:  Not on file  PSA: Not on file  Bone density: Never  Lipid panel: October 2014  Allergies  Allergen Reactions  . Erythromycin Other (See Comments)    Gums turned  red and bled profusely  . Tape Other (See Comments)    The patient's SKIN IS VERY THIN AND TEARS AND BRUISES VERY EASILY!!    Current Outpatient Medications  Medication Sig Dispense Refill  . acetaminophen (TYLENOL) 650 MG CR tablet Take 650 mg by mouth at bedtime.    [name] aspirin 81 MG tablet Take 1 tablet (81 mg total) by mouth daily. 90 tablet 3  . cetirizine (ZYRTEC) 10 MG tablet Take 10 mg by mouth at bedtime.    . CVS D3 25 MCG (1000 UT) capsule TAKE 1 CAPSULE BY MOUTH EVERY DAY 90 capsule 3  . FLUoxetine (PROZAC) 10 MG capsule TAKE 1 CAPSULE BY MOUTH EVERY DAY 90 capsule 0  . FLUoxetine (PROZAC) 20 MG capsule TAKE 1 CAPSULE BY MOUTH EVERY DAY 90 capsule 1  . hydroxyurea (HYDREA) 500 MG capsule Take 1 capsule (500 mg total) by mouth daily. May take with food to minimize GI side effects. 90 capsule 3  . saccharomyces boulardii (FLORASTOR) 250 MG capsule Take 1 capsule (250 mg total) by mouth 2 (two) times daily.    . sodium chloride (OCEAN) 0.65 % nasal spray Place 1 spray into the nose 4 (four) times daily as needed for congestion.     [name] terazosin (HYTRIN) 2 MG capsule TAKE 1 CAPSULE (2 MG TOTAL) BY MOUTH AT BEDTIME. 90 capsule 3   No current facility-administered medications for this visit.     OBJECTIVE:  Vitals:   03/20/19 1421  BP: (!) 118/52  Pulse: 79  Resp: 18  Temp: 98.9 F (37.2 C)  SpO2: 97%    Body mass index is 21.65 kg/m.  Filed Weights   03/20/19 1421  Weight: 168 lb 9.6 oz (76.5 kg)     ECOG FS:2 - Symptomatic, <50% confined to bed GENERAL: Patient is an older man, appears tired HEENT:  Sclerae anicteric.  Mask in place. Neck is supple.  NODES:  No cervical, supraclavicular, or axillary lymphadenopathy palpated.  LUNGS:  Clear to auscultation bilaterally.  No wheezes or rhonchi. HEART:  Regular rate and rhythm. No murmur appreciated. ABDOMEN:  Soft, nontender.  Positive, normoactive bowel sounds. No organomegaly palpated. MSK:  No focal spinal tenderness  to palpation.  EXTREMITIES:  No peripheral edema.   SKIN:  Clear with no obvious rashes or skin changes. No nail dyscrasia. NEURO:  Nonfocal. Well oriented.  Appropriate affect.     LAB RESULTS:  CBC    Component Value Date/Time   WBC 11.5 (H) 03/20/2019 1355   WBC 12.6 (H) 02/06/2019 1351   RBC 2.87 (L) 03/20/2019 1355   HGB 10.0 (L) 03/20/2019 1355   HGB 11.4 (L) 02/23/2019 1529   HGB 8.7 (L) 06/03/2017 1404   HCT 31.2 (L) 03/20/2019 1355   HCT 32.8 (L) 02/23/2019 1529   HCT 27.6 (L) 06/03/2017 1404  PLT 350 03/20/2019 1355   PLT 346 02/23/2019 1529   MCV 108.7 (H) 03/20/2019 1355   MCV 102 (H) 02/23/2019 1529   MCV 103.8 (H) 06/03/2017 1404   MCH 34.8 (H) 03/20/2019 1355   MCHC 32.1 03/20/2019 1355   RDW 18.7 (H) 03/20/2019 1355   RDW 16.8 (H) 02/23/2019 1529   RDW 20.5 (H) 06/03/2017 1404   LYMPHSABS 2.0 03/20/2019 1355   LYMPHSABS 2.7 02/23/2019 1529   LYMPHSABS 2.7 06/03/2017 1404   MONOABS 1.3 (H) 03/20/2019 1355   MONOABS 2.2 (H) 06/03/2017 1404   EOSABS 0.0 03/20/2019 1355   EOSABS 0.0 02/23/2019 1529   BASOSABS 0.2 (H) 03/20/2019 1355   BASOSABS 0.4 (H) 02/23/2019 1529   BASOSABS 0.5 (H) 06/03/2017 1404      Chemistry      Component Value Date/Time   NA 142 03/20/2019 1355   NA 140 02/23/2019 1529   NA 142 06/03/2017 1404   K 4.3 03/20/2019 1355   K 3.9 06/03/2017 1404   CL 109 03/20/2019 1355   CO2 24 03/20/2019 1355   CO2 26 06/03/2017 1404   BUN 32 (H) 03/20/2019 1355   BUN 26 02/23/2019 1529   BUN 19.9 06/03/2017 1404   CREATININE 1.57 (H) 03/20/2019 1355   CREATININE 1.6 (H) 06/03/2017 1404      Component Value Date/Time   CALCIUM 8.7 (L) 03/20/2019 1355   CALCIUM 8.9 06/03/2017 1404   ALKPHOS 71 03/20/2019 1355   ALKPHOS 91 06/03/2017 1404   AST 20 03/20/2019 1355   AST 20 06/03/2017 1404   ALT 13 03/20/2019 1355   ALT 12 06/03/2017 1404   BILITOT 0.5 03/20/2019 1355   BILITOT 0.48 06/03/2017 1404       STUDIES: No results  found.    ASSESSMENT: 85 y.o.  Nicholas [name] man with   (1) essential thrombocytosis initially diagnosed December 2003,  controlled on anagrelide 0.5 mg daily and aspirin 81 mg daily initially, stopped 06/2017  (a) no evidence of leukemic transformation  (b) Hydrea 549m daily starting in 07/2017  (2) anemia, secondary to chronic kidney disease, receiving aranesp 3085m every 4 weeks  (3) possible transition to myelofibrosis on bone marrow biopsy 06/22/2017,  (a) normal cytogenetics  (b) FISH WNL   PLAN: Doy's anemia remains stable.  It is 10 today, so he will receive Aranesp.  Perhaps this will help with his energy.  His plt count is stable at 350.  He will Continue the Hydroxyurea daily as he is tolerating this well.  Jamarie and I reviewed his fatigue.  His PCP will follow up on his urine sample.  I offered to reorder any testing if needed here.  He will return in 6 weeks for labs and f/u with usKoreao see how he is feeling.    he was recommended to continue with the appropriate pandemic precautions. he knows to call for any questions that may arise between now and his next appointment.  We are happy to see him sooner if needed.   A total of (20) minutes of face-to-face time was spent with this patient with greater than 50% of that time in counseling and care-coordination.   LiWilber BihariNP  03/20/19 4:54 PM Medical Oncology and Hematology CoAtrium Health Union[address]vWestern LakeNC [address]el. [phone]  Fax. [phone]

## 2019-03-20 NOTE — Patient Instructions (Signed)
Darbepoetin Alfa injection What is this medicine? DARBEPOETIN ALFA (dar be POE e tin AL fa) helps your body make more red blood cells. It is used to treat anemia caused by chronic kidney failure and chemotherapy. This medicine may be used for other purposes; ask your health care provider or pharmacist if you have questions. COMMON BRAND NAME(S): Aranesp What should I tell my health care provider before I take this medicine? They need to know if you have any of these conditions:  blood clotting disorders or history of blood clots  cancer patient not on chemotherapy  cystic fibrosis  heart disease, such as angina, heart failure, or a history of a heart attack  hemoglobin level of 12 g/dL or greater  high blood pressure  low levels of folate, iron, or vitamin B12  seizures  an unusual or allergic reaction to darbepoetin, erythropoietin, albumin, hamster proteins, latex, other medicines, foods, dyes, or preservatives  pregnant or trying to get pregnant  breast-feeding How should I use this medicine? This medicine is for injection into a vein or under the skin. It is usually given by a health care professional in a hospital or clinic setting. If you get this medicine at home, you will be taught how to prepare and give this medicine. Use exactly as directed. Take your medicine at regular intervals. Do not take your medicine more often than directed. It is important that you put your used needles and syringes in a special sharps container. Do not put them in a trash can. If you do not have a sharps container, call your pharmacist or healthcare provider to get one. A special MedGuide will be given to you by the pharmacist with each prescription and refill. Be sure to read this information carefully each time. Talk to your pediatrician regarding the use of this medicine in children. While this medicine may be used in children as young as 1 month of age for selected conditions, precautions do  apply. Overdosage: If you think you have taken too much of this medicine contact a poison control center or emergency room at once. NOTE: This medicine is only for you. Do not share this medicine with others. What if I miss a dose? If you miss a dose, take it as soon as you can. If it is almost time for your next dose, take only that dose. Do not take double or extra doses. What may interact with this medicine? Do not take this medicine with any of the following medications:  epoetin alfa This list may not describe all possible interactions. Give your health care provider a list of all the medicines, herbs, non-prescription drugs, or dietary supplements you use. Also tell them if you smoke, drink alcohol, or use illegal drugs. Some items may interact with your medicine. What should I watch for while using this medicine? Your condition will be monitored carefully while you are receiving this medicine. You may need blood work done while you are taking this medicine. This medicine may cause a decrease in vitamin B6. You should make sure that you get enough vitamin B6 while you are taking this medicine. Discuss the foods you eat and the vitamins you take with your health care professional. What side effects may I notice from receiving this medicine? Side effects that you should report to your doctor or health care professional as soon as possible:  allergic reactions like skin rash, itching or hives, swelling of the face, lips, or tongue  breathing problems  changes in   vision  chest pain  confusion, trouble speaking or understanding  feeling faint or lightheaded, falls  high blood pressure  muscle aches or pains  pain, swelling, warmth in the leg  rapid weight gain  severe headaches  sudden numbness or weakness of the face, arm or leg  trouble walking, dizziness, loss of balance or coordination  seizures (convulsions)  swelling of the ankles, feet, hands  unusually weak or  tired Side effects that usually do not require medical attention (report to your doctor or health care professional if they continue or are bothersome):  diarrhea  fever, chills (flu-like symptoms)  headaches  nausea, vomiting  redness, stinging, or swelling at site where injected This list may not describe all possible side effects. Call your doctor for medical advice about side effects. You may report side effects to FDA at 1-800-FDA-1088. Where should I keep my medicine? Keep out of the reach of children. Store in a refrigerator between 2 and 8 degrees C (36 and 46 degrees F). Do not freeze. Do not shake. Throw away any unused portion if using a single-dose vial. Throw away any unused medicine after the expiration date. NOTE: This sheet is a summary. It may not cover all possible information. If you have questions about this medicine, talk to your doctor, pharmacist, or health care provider.  2020 Elsevier/Gold Standard (2017-06-15 16:44:20)  

## 2019-03-21 ENCOUNTER — Telehealth: Payer: Self-pay | Admitting: Medical

## 2019-03-21 LAB — URINALYSIS
Bilirubin, UA: NEGATIVE
Glucose, UA: NEGATIVE
Ketones, UA: NEGATIVE
Leukocytes,UA: NEGATIVE
Nitrite, UA: NEGATIVE
RBC, UA: NEGATIVE
Specific Gravity, UA: 1.019 (ref 1.005–1.030)
Urobilinogen, Ur: 0.2 mg/dL (ref 0.2–1.0)
pH, UA: 7.5 (ref 5.0–7.5)

## 2019-03-21 MED ORDER — CALCIUM CARBONATE-VITAMIN D3 600-400 MG-UNIT PO TABS: 1.0000 | ORAL_TABLET | Freq: Two times a day (BID) | ORAL | 2 refills | Status: AC

## 2019-03-21 NOTE — Telephone Encounter (Signed)
Please review with Nicholas [name], his daughter  I reviewed labs from yesterday.  urinalsysi didn't seem to be done.  Labs show blood counts blood count a little worse than last check but overall relatively stable.  Calcium was somewat low.  Rest of labs stable.  Kidney marker abnormal but stable.  Recommendations: 1-I recommend beginning calcium and vitamin D supplement I sent to pharanmcy 2-given ongoing fatigue, if not improving in a few weeks, consider head CT to help evaluate.   3- he can come in for repeat clean catch urine test

## 2019-03-22 ENCOUNTER — Telehealth: Payer: Self-pay

## 2019-03-22 ENCOUNTER — Telehealth: Payer: Self-pay | Admitting: Oncology

## 2019-03-22 NOTE — Telephone Encounter (Signed)
Nicholas [name] was notified.

## 2019-03-22 NOTE — Telephone Encounter (Signed)
-----   Message from Gardenia Phlegm, NP sent at 03/22/2019  1:16 PM EDT ----- Please call patient and daughter and give normal UA results ----- Message ----- From: Interface, [facility] Lab Results In Sent: 03/22/2019   1:04 PM EDT To: Gardenia Phlegm, NP

## 2019-03-22 NOTE — Telephone Encounter (Signed)
I could not reach patient I will mail

## 2019-03-22 NOTE — Telephone Encounter (Signed)
Left vm for [name] (daughter) to call back so we may give normal UA results.

## 2019-03-23 ENCOUNTER — Other Ambulatory Visit: Payer: Self-pay

## 2019-03-23 ENCOUNTER — Other Ambulatory Visit: Payer: Self-pay | Admitting: Medical

## 2019-03-23 ENCOUNTER — Other Ambulatory Visit (INDEPENDENT_AMBULATORY_CARE_PROVIDER_SITE_OTHER): Payer: Medicare Other

## 2019-03-23 ENCOUNTER — Telehealth: Payer: Self-pay | Admitting: Medical

## 2019-03-23 DIAGNOSIS — R300 Dysuria: Secondary | ICD-10-CM

## 2019-03-23 DIAGNOSIS — R5381 Other malaise: Secondary | ICD-10-CM

## 2019-03-23 LAB — POCT URINALYSIS DIP (PROADVANTAGE DEVICE)
Bilirubin, UA: NEGATIVE
Blood, UA: NEGATIVE
Glucose, UA: NEGATIVE mg/dL
Ketones, POC UA: NEGATIVE mg/dL
Leukocytes, UA: NEGATIVE
Nitrite, UA: NEGATIVE
Specific Gravity, Urine: 1.015
Urobilinogen, Ur: NEGATIVE
pH, UA: 6.5 (ref 5.0–8.0)

## 2019-03-23 LAB — POCT UA - MICROSCOPIC ONLY

## 2019-03-23 MED ORDER — SULFAMETHOXAZOLE-TRIMETHOPRIM 800-160 MG PO TABS: 1.0000 | ORAL_TABLET | Freq: Two times a day (BID) | ORAL | 0 refills | Status: DC

## 2019-03-23 NOTE — Telephone Encounter (Signed)
urinalysis still showing pH 7.5.   Daughter [name] will have him submit clean catch urine sample this morning, and we will send for culture.   He will start Bactrim.  He completed 2 rounds of Cipro recently with no apparent improvement.   We discussed potential risks of additional antibiotics prior to the culture.    He may ultimately need to see urology or hospital admission if not improving.

## 2019-03-23 NOTE — Telephone Encounter (Signed)
Duplicate entry.  See other message

## 2019-03-25 LAB — URINE CULTURE

## 2019-05-01 ENCOUNTER — Inpatient Hospital Stay: Payer: Medicare Other

## 2019-05-01 ENCOUNTER — Inpatient Hospital Stay: Payer: Medicare Other | Attending: Oncology | Admitting: Adult Health

## 2019-05-01 ENCOUNTER — Encounter: Payer: Self-pay | Admitting: Adult Health

## 2019-05-01 ENCOUNTER — Telehealth: Payer: Self-pay

## 2019-05-01 ENCOUNTER — Other Ambulatory Visit: Payer: Self-pay

## 2019-05-01 VITALS — BP 126/59 | HR 78 | Temp 98.2°F | Resp 18 | Ht 74.0 in | Wt 166.6 lb

## 2019-05-01 DIAGNOSIS — N183 Chronic kidney disease, stage 3 unspecified: Secondary | ICD-10-CM

## 2019-05-01 DIAGNOSIS — N1831 Chronic kidney disease, stage 3a: Secondary | ICD-10-CM | POA: Diagnosis not present

## 2019-05-01 DIAGNOSIS — D473 Essential (hemorrhagic) thrombocythemia: Secondary | ICD-10-CM

## 2019-05-01 DIAGNOSIS — Z79899 Other long term (current) drug therapy: Secondary | ICD-10-CM | POA: Diagnosis not present

## 2019-05-01 DIAGNOSIS — N189 Chronic kidney disease, unspecified: Secondary | ICD-10-CM | POA: Insufficient documentation

## 2019-05-01 DIAGNOSIS — D631 Anemia in chronic kidney disease: Secondary | ICD-10-CM | POA: Insufficient documentation

## 2019-05-01 DIAGNOSIS — R399 Unspecified symptoms and signs involving the genitourinary system: Secondary | ICD-10-CM

## 2019-05-01 DIAGNOSIS — R161 Splenomegaly, not elsewhere classified: Secondary | ICD-10-CM

## 2019-05-01 LAB — CBC WITH DIFFERENTIAL/PLATELET
Abs Immature Granulocytes: 0.97 10*3/uL — ABNORMAL HIGH (ref 0.00–0.07)
Basophils Absolute: 0.2 10*3/uL — ABNORMAL HIGH (ref 0.0–0.1)
Basophils Relative: 1 %
Eosinophils Absolute: 0 10*3/uL (ref 0.0–0.5)
Eosinophils Relative: 0 %
HCT: 31 % — ABNORMAL LOW (ref 39.0–52.0)
Hemoglobin: 9.8 g/dL — ABNORMAL LOW (ref 13.0–17.0)
Immature Granulocytes: 7 %
Lymphocytes Relative: 17 %
Lymphs Abs: 2.2 10*3/uL (ref 0.7–4.0)
MCH: 36.8 pg — ABNORMAL HIGH (ref 26.0–34.0)
MCHC: 31.6 g/dL (ref 30.0–36.0)
MCV: 116.5 fL — ABNORMAL HIGH (ref 80.0–100.0)
Monocytes Absolute: 1.4 10*3/uL — ABNORMAL HIGH (ref 0.1–1.0)
Monocytes Relative: 11 %
Neutro Abs: 8.3 10*3/uL — ABNORMAL HIGH (ref 1.7–7.7)
Neutrophils Relative %: 64 %
Platelets: 325 10*3/uL (ref 150–400)
RBC: 2.66 MIL/uL — ABNORMAL LOW (ref 4.22–5.81)
RDW: 18.2 % — ABNORMAL HIGH (ref 11.5–15.5)
WBC: 13 10*3/uL — ABNORMAL HIGH (ref 4.0–10.5)
nRBC: 0.5 % — ABNORMAL HIGH (ref 0.0–0.2)

## 2019-05-01 LAB — URINALYSIS, COMPLETE (UACMP) WITH MICROSCOPIC
Bilirubin Urine: NEGATIVE
Glucose, UA: NEGATIVE mg/dL
Hgb urine dipstick: NEGATIVE
Ketones, ur: NEGATIVE mg/dL
Leukocytes,Ua: NEGATIVE
Nitrite: NEGATIVE
Protein, ur: NEGATIVE mg/dL
Specific Gravity, Urine: 1.015 (ref 1.005–1.030)
pH: 7 (ref 5.0–8.0)

## 2019-05-01 MED ORDER — DARBEPOETIN ALFA 300 MCG/0.6ML IJ SOSY
300.0000 ug | PREFILLED_SYRINGE | Freq: Once | INTRAMUSCULAR | Status: AC
  Administered 2019-05-01: 14:00:00 300 ug via SUBCUTANEOUS

## 2019-05-01 MED ORDER — DARBEPOETIN ALFA 300 MCG/0.6ML IJ SOSY
PREFILLED_SYRINGE | INTRAMUSCULAR | Status: AC
  Filled 2019-05-01: qty 0.6

## 2019-05-01 NOTE — Telephone Encounter (Signed)
-----   Message from Gardenia Phlegm, NP sent at 05/01/2019  2:59 PM EST ----- Please call patient.  His urine looks good. ----- Message ----- From: Buel Ream, Lab In [place] Sent: 05/01/2019   2:28 PM EST To: Gardenia Phlegm, NP

## 2019-05-01 NOTE — Patient Instructions (Signed)
Darbepoetin Alfa injection What is this medicine? DARBEPOETIN ALFA (dar be POE e tin AL fa) helps your body make more red blood cells. It is used to treat anemia caused by chronic kidney failure and chemotherapy. This medicine may be used for other purposes; ask your health care provider or pharmacist if you have questions. COMMON BRAND NAME(S): Aranesp What should I tell my health care provider before I take this medicine? They need to know if you have any of these conditions:  blood clotting disorders or history of blood clots  cancer patient not on chemotherapy  cystic fibrosis  heart disease, such as angina, heart failure, or a history of a heart attack  hemoglobin level of 12 g/dL or greater  high blood pressure  low levels of folate, iron, or vitamin B12  seizures  an unusual or allergic reaction to darbepoetin, erythropoietin, albumin, hamster proteins, latex, other medicines, foods, dyes, or preservatives  pregnant or trying to get pregnant  breast-feeding How should I use this medicine? This medicine is for injection into a vein or under the skin. It is usually given by a health care professional in a hospital or clinic setting. If you get this medicine at home, you will be taught how to prepare and give this medicine. Use exactly as directed. Take your medicine at regular intervals. Do not take your medicine more often than directed. It is important that you put your used needles and syringes in a special sharps container. Do not put them in a trash can. If you do not have a sharps container, call your pharmacist or healthcare provider to get one. A special MedGuide will be given to you by the pharmacist with each prescription and refill. Be sure to read this information carefully each time. Talk to your pediatrician regarding the use of this medicine in children. While this medicine may be used in children as young as 1 month of age for selected conditions, precautions do  apply. Overdosage: If you think you have taken too much of this medicine contact a poison control center or emergency room at once. NOTE: This medicine is only for you. Do not share this medicine with others. What if I miss a dose? If you miss a dose, take it as soon as you can. If it is almost time for your next dose, take only that dose. Do not take double or extra doses. What may interact with this medicine? Do not take this medicine with any of the following medications:  epoetin alfa This list may not describe all possible interactions. Give your health care provider a list of all the medicines, herbs, non-prescription drugs, or dietary supplements you use. Also tell them if you smoke, drink alcohol, or use illegal drugs. Some items may interact with your medicine. What should I watch for while using this medicine? Your condition will be monitored carefully while you are receiving this medicine. You may need blood work done while you are taking this medicine. This medicine may cause a decrease in vitamin B6. You should make sure that you get enough vitamin B6 while you are taking this medicine. Discuss the foods you eat and the vitamins you take with your health care professional. What side effects may I notice from receiving this medicine? Side effects that you should report to your doctor or health care professional as soon as possible:  allergic reactions like skin rash, itching or hives, swelling of the face, lips, or tongue  breathing problems  changes in   vision  chest pain  confusion, trouble speaking or understanding  feeling faint or lightheaded, falls  high blood pressure  muscle aches or pains  pain, swelling, warmth in the leg  rapid weight gain  severe headaches  sudden numbness or weakness of the face, arm or leg  trouble walking, dizziness, loss of balance or coordination  seizures (convulsions)  swelling of the ankles, feet, hands  unusually weak or  tired Side effects that usually do not require medical attention (report to your doctor or health care professional if they continue or are bothersome):  diarrhea  fever, chills (flu-like symptoms)  headaches  nausea, vomiting  redness, stinging, or swelling at site where injected This list may not describe all possible side effects. Call your doctor for medical advice about side effects. You may report side effects to FDA at 1-800-FDA-1088. Where should I keep my medicine? Keep out of the reach of children. Store in a refrigerator between 2 and 8 degrees C (36 and 46 degrees F). Do not freeze. Do not shake. Throw away any unused portion if using a single-dose vial. Throw away any unused medicine after the expiration date. NOTE: This sheet is a summary. It may not cover all possible information. If you have questions about this medicine, talk to your doctor, pharmacist, or health care provider.  2020 Elsevier/Gold Standard (2017-06-15 16:44:20)  

## 2019-05-01 NOTE — Progress Notes (Signed)
[name]  Telephone:(336) [phone] Fax:(336) [phone]   ID: Nicholas [name].   DOB: [birth year]  MR#: [id number]  DSK#:[id number]  Patient Care Team: Denita Lung, MD as PCP - General (Family Medicine) Magrinat, Virgie Dad, MD as Consulting Physician (Oncology) Tysinger, Camelia Eng, PA-C as Physician Assistant (Family Medicine)  OTHERS: Dr. Posey Pronto (eye doctor)   CHIEF COMPLAINT:  Essential thrombocytosis and chronic anemia  CURRENT TREATMENT: hydroxyurea; Aranesp   INTERVAL HISTORY: Bryar returns today for follow-up and treatment of his myeloproliferative disorder.  He is accompanied by his daughter (who incidentally works in primary care, and Dr. Lanice Shirts office).  He continues on hydroxyurea, 573m daily.  He reports no side effects from this medication  He is also receiving Aranesp, last received in 03/2019.  He tolerates this well.    REVIEW OF SYSTEMS: Nicholas [name] is doing well today.  His daughter notes that he has regained his strength, and was able to walk in here today.  His most recent urinalysis was normal, and his UTI issues have clearedup.  He has had some issues with macular degeneration, and should have surgery, however that has had to be delayed due to COVID-19.    Kaien denies any fever, chills, chest pain, palpitations, nausea, vomiting, bowel/bladder changes, headaches, vision changes.  A detailed ROS was otherwise non contributory today.    PAST MEDICAL HISTORY: Past Medical History:  Diagnosis Date  . Abnormal CT scan, sinus 10/04/06   pansinusitis, mildly deviated septum  . Allergy   . Anemia    related to CKD  . Chronic sinusitis   . Dysthymia    dysthymia  . H/O echocardiogram 01/04/2007   LV function normal, 55-60% EF, mildly increased thickenss of LV wall  . H/O echocardiogram 12/2006   mild LV wall thickening, EF 55-60%, LV function normal  . History of MRI of brain and brain stem 02/26/06   sinusitis, nonspecific deep white matter  changes normal for age  . Hypertension   . Seborrheic keratosis    sees HToni Arthurs NP dermatology  . [place]    sees hematology    PAST SURGICAL HISTORY: Past Surgical History:  Procedure Laterality Date  . HERNIA REPAIR     right inguinal repair  . I&D EXTREMITY Right 09/16/2017   Procedure: IRRIGATION AND DEBRIDEMENT EXTREMITY;  Surgeon: GRoseanne Kaufman MD;  [place] [facility]  Service: Orthopedics;  Laterality: Right;  . TONSILLECTOMY     childhood  . VARICOSE VEIN SURGERY     right lower leg    FAMILY HISTORY Family History  Problem Relation Age of Onset  . Heart disease Father   . Stroke Father   . Cancer Neg Hx   . COPD Neg Hx   . Diabetes Neg Hx     SOCIAL HISTORY:  (Reviewed 11/28/2013) The patient lives with his wife, who suffers from diabetes and emphysema and has had some small strokes. She is able to care for herself. He gives her her insulin shots. He has 2 daughters, SBarbera Setterswho lives in [place] and C[place]aldwho [place] works with Dr L[employer].The patient has 3 grandchildren..Nicholas [name]  ADVANCED DIRECTIVES: the patient tells me his daughter KLennox Dolberrywould make health care decisions if he is incapacitated    HEALTH MAINTENANCE: (Updated 11/28/2013) Social History   Tobacco Use  . Smoking status: Former Smoker    Quit date: 06/14/1958    Years since quitting: 60.9  .  Smokeless tobacco: Never Used  Substance Use Topics  . Alcohol use: No  . Drug use: No     Colonoscopy:  Not on file  PSA: Not on file  Bone density: Never  Lipid panel: October 2014  Allergies  Allergen Reactions  . Erythromycin Other (See Comments)    Gums turned red and bled profusely  . Tape Other (See Comments)    The patient's SKIN IS VERY THIN AND TEARS AND BRUISES VERY EASILY!!    Current Outpatient Medications  Medication Sig Dispense Refill  . acetaminophen (TYLENOL) 650 MG CR tablet Take 650 mg by mouth at bedtime.    Nicholas [name] aspirin 81  MG tablet Take 1 tablet (81 mg total) by mouth daily. 90 tablet 3  . Calcium Carbonate-Vitamin D3 600-400 MG-UNIT TABS Take 1 tablet by mouth 2 (two) times daily. 60 tablet 2  . cetirizine (ZYRTEC) 10 MG tablet Take 10 mg by mouth at bedtime.    . CVS D3 25 MCG (1000 UT) capsule TAKE 1 CAPSULE BY MOUTH EVERY DAY 90 capsule 3  . FLUoxetine (PROZAC) 20 MG capsule TAKE 1 CAPSULE BY MOUTH EVERY DAY 90 capsule 1  . hydroxyurea (HYDREA) 500 MG capsule Take 1 capsule (500 mg total) by mouth daily. May take with food to minimize GI side effects. 90 capsule 3  . saccharomyces boulardii (FLORASTOR) 250 MG capsule Take 1 capsule (250 mg total) by mouth 2 (two) times daily.    . sodium chloride (OCEAN) 0.65 % nasal spray Place 1 spray into the nose 4 (four) times daily as needed for congestion.     Nicholas [name] terazosin (HYTRIN) 2 MG capsule TAKE 1 CAPSULE (2 MG TOTAL) BY MOUTH AT BEDTIME. 90 capsule 3   No current facility-administered medications for this visit.     OBJECTIVE:  Vitals:   05/01/19 1328  BP: (!) 126/59  Pulse: 78  Resp: 18  Temp: 98.2 F (36.8 C)  SpO2: 97%    Body mass index is 21.39 kg/m.  Filed Weights   05/01/19 1328  Weight: 166 lb 9.6 oz (75.6 kg)     ECOG FS:2 - Symptomatic, <50% confined to bed GENERAL: Patient is an older man, appears stated age 85:  Sclerae anicteric.  Mild erythema noted on upper lids, Mask in place. Neck is supple.  NODES:  No cervical, supraclavicular, or axillary lymphadenopathy palpated.  LUNGS:  Clear to auscultation bilaterally.  No wheezes or rhonchi. HEART:  Regular rate and rhythm. No murmur appreciated. ABDOMEN:  Soft, nontender.  Positive, normoactive bowel sounds. No organomegaly palpated. MSK:  No focal spinal tenderness to palpation.  EXTREMITIES:  No peripheral edema.   SKIN:  Clear with no obvious rashes or skin changes. No nail dyscrasia. NEURO:  Nonfocal. Well oriented.  Appropriate affect.     LAB RESULTS:  CBC    Component  Value Date/Time   WBC 13.0 (H) 05/01/2019 1311   RBC 2.66 (L) 05/01/2019 1311   HGB 9.8 (L) 05/01/2019 1311   HGB 10.0 (L) 03/20/2019 1355   HGB 11.4 (L) 02/23/2019 1529   HGB 8.7 (L) 06/03/2017 1404   HCT 31.0 (L) 05/01/2019 1311   HCT 32.8 (L) 02/23/2019 1529   HCT 27.6 (L) 06/03/2017 1404   PLT 325 05/01/2019 1311   PLT 350 03/20/2019 1355   PLT 346 02/23/2019 1529   MCV 116.5 (H) 05/01/2019 1311   MCV 102 (H) 02/23/2019 1529   MCV 103.8 (H) 06/03/2017 1404   MCH 36.8 (H)  05/01/2019 1311   MCHC 31.6 05/01/2019 1311   RDW 18.2 (H) 05/01/2019 1311   RDW 16.8 (H) 02/23/2019 1529   RDW 20.5 (H) 06/03/2017 1404   LYMPHSABS PENDING 05/01/2019 1311   LYMPHSABS 2.7 02/23/2019 1529   LYMPHSABS 2.7 06/03/2017 1404   MONOABS PENDING 05/01/2019 1311   MONOABS 2.2 (H) 06/03/2017 1404   EOSABS PENDING 05/01/2019 1311   EOSABS 0.0 02/23/2019 1529   BASOSABS PENDING 05/01/2019 1311   BASOSABS 0.4 (H) 02/23/2019 1529   BASOSABS 0.5 (H) 06/03/2017 1404      Chemistry      Component Value Date/Time   NA 142 03/20/2019 1355   NA 140 02/23/2019 1529   NA 142 06/03/2017 1404   K 4.3 03/20/2019 1355   K 3.9 06/03/2017 1404   CL 109 03/20/2019 1355   CO2 24 03/20/2019 1355   CO2 26 06/03/2017 1404   BUN 32 (H) 03/20/2019 1355   BUN 26 02/23/2019 1529   BUN 19.9 06/03/2017 1404   CREATININE 1.57 (H) 03/20/2019 1355   CREATININE 1.6 (H) 06/03/2017 1404      Component Value Date/Time   CALCIUM 8.7 (L) 03/20/2019 1355   CALCIUM 8.9 06/03/2017 1404   ALKPHOS 71 03/20/2019 1355   ALKPHOS 91 06/03/2017 1404   AST 20 03/20/2019 1355   AST 20 06/03/2017 1404   ALT 13 03/20/2019 1355   ALT 12 06/03/2017 1404   BILITOT 0.5 03/20/2019 1355   BILITOT 0.48 06/03/2017 1404       STUDIES: No results found.    ASSESSMENT: 85 y.o.  [place] man with   (1) essential thrombocytosis initially diagnosed December 2003,  controlled on anagrelide 0.5 mg daily and aspirin 81 mg daily  initially, stopped 06/2017  (a) no evidence of leukemic transformation  (b) Hydrea 571m daily starting in 07/2017  (2) anemia, secondary to chronic kidney disease, receiving aranesp 3065m every 6 weeks  (3) possible transition to myelofibrosis on bone marrow biopsy 06/22/2017,  (a) normal cytogenetics  (b) FISH WNL   PLAN: Jermaine's anemia remains stable.   He will receive Aranesp today, and has now gone to receiving an injection every 6 weeks.  He is tolerating this well.  He had an unpleasant experience with his injection appointment last time, and requested that my nurse give him his injection today.  We were happy to accommodate that request for him.    He will continue on the Hydrea daily and his plt count remains stable.    Quillan will return in 05/2019 for labs, f/u with Dr. MaJana Hakimand his next injection. He was recommended to continue with the appropriate pandemic precautions. he knows to call for any questions that may arise between now and his next appointment.  We are happy to see him sooner if needed.   A total of (20) minutes of face-to-face time was spent with this patient with greater than 50% of that time in counseling and care-coordination.   LiWilber BihariNP  05/01/19 1:42 PM Medical Oncology and Hematology CoCenter For Digestive Health Ltd[address]vLake [place] [address]el. [phone]  Fax. [phone]

## 2019-05-01 NOTE — Telephone Encounter (Signed)
Spoke with patient's daughter [name] to inform that his urine looks good.  [name] voiced understanding and thanks for call.

## 2019-05-02 ENCOUNTER — Telehealth: Payer: Self-pay

## 2019-05-02 NOTE — Telephone Encounter (Signed)
-----   Message from Gardenia Phlegm, NP sent at 05/02/2019 10:15 AM EST ----- Urine culture is good.  Please notify patient daughter ----- Message ----- From: Interface, Lab In [place] Sent: 05/01/2019   2:28 PM EST To: Gardenia Phlegm, NP

## 2019-05-02 NOTE — Telephone Encounter (Signed)
Left vm for [name], patient's daughter, that his urine culture is good.  Left center number for back if they have questions.

## 2019-05-03 LAB — URINE CULTURE: Culture: 50000 — AB

## 2019-05-29 ENCOUNTER — Other Ambulatory Visit: Payer: Self-pay | Admitting: Medical

## 2019-06-03 ENCOUNTER — Other Ambulatory Visit: Payer: Self-pay | Admitting: Family Medicine

## 2019-06-11 NOTE — Progress Notes (Signed)
[place]  Telephone:(336) [phone] Fax:(336) [phone]   ID: Nicholas [name].   DOB: 12/15/1927  MR#: [id number]  DEY#:[id number]  Patient Care Team: Denita Lung, MD as PCP - General (Family Medicine) Jalee Saine, Virgie Dad, MD as Consulting Physician (Oncology) Tysinger, Camelia Eng, PA-C as Physician Assistant (Family Medicine)  OTHERS: Dr. Posey Pronto (eye doctor)   CHIEF COMPLAINT:  Essential thrombocytosis and chronic anemia  CURRENT TREATMENT: hydroxyurea; Aranesp   INTERVAL HISTORY: Nicholas [name] returns today for follow-up and treatment of his myeloproliferative disorder.  He is accompanied by his daughter (who incidentally works in primary care, and Dr. Lanice Shirts office) Nicholas [name]  He continues on hydroxyurea, '500mg'$  daily.  He tolerates that well.  He is also receiving Aranesp, every 6 weeks, last received on 05/01/2019.  Because of a mixup in the infusion room sometime ago where he was phlebotomized instead of given Aranesp now he does not want to go back there he says   REVIEW OF SYSTEMS: Nicholas [name] is not doing well.  He is losing his eyesight.  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  He is having significant dental issues and he may need to have several extractions.  He uses a cane sometimes but mostly at home and he dentist walks holding onto things.  He denies falls.  A detailed review of systems was otherwise stable   PAST MEDICAL HISTORY: Past Medical History:  Diagnosis Date  . Abnormal CT scan, sinus 10/04/06   pansinusitis, mildly deviated septum  . Allergy   . Anemia    related to CKD  . Chronic sinusitis   . Dysthymia    dysthymia  . H/O echocardiogram 01/04/2007   LV function normal, 55-60% EF, mildly increased thickenss of LV wall  . H/O echocardiogram 12/2006   mild LV wall thickening, EF 55-60%, LV function normal  . History of MRI of brain and brain stem 02/26/06   sinusitis, nonspecific deep white matter  changes normal for age  . Hypertension   . Seborrheic keratosis    sees Toni Arthurs, NP dermatology  . Thrombocytosis ([place])    sees hematology    PAST SURGICAL HISTORY: Past Surgical History:  Procedure Laterality Date  . HERNIA REPAIR     right inguinal repair  . I & D EXTREMITY Right 09/16/2017   Procedure: IRRIGATION AND DEBRIDEMENT EXTREMITY;  Surgeon: Roseanne Kaufman, MD;  Location: [place];  Service: Orthopedics;  Laterality: Right;  . TONSILLECTOMY     childhood  . VARICOSE VEIN SURGERY     right lower leg    FAMILY HISTORY Family History  Problem Relation Age of Onset  . Heart disease Father   . Stroke Father   . Cancer Neg Hx   . COPD Neg Hx   . Diabetes Neg Hx     SOCIAL HISTORY:  (Updated December 2020) The patient lives with his wife, who suffers from diabetes and emphysema and has had some small strokes. She has progressive dementia.  He gives her her insulin shots. He has 2 daughters, [name] who lives a mile away and is pretty much retired and International aid/development worker who lives near the [employer] and works with Dr [employer] .The patient has 3 grandchildren.[name]   ADVANCED DIRECTIVES: the patient tells me his daughter Nicholas [name] would make health care decisions if he is incapacitated.  At the 06/12/2019 visit the patient was given the appropriate documents to complete and notarized  at his discretion.    HEALTH MAINTENANCE:  Social History   Tobacco Use  . Smoking status: Former Smoker    Quit date: 06/14/1958    Years since quitting: 61.0  . Smokeless tobacco: Never Used  Substance Use Topics  . Alcohol use: No  . Drug use: No     Colonoscopy:  Not on file  PSA: Not on file  Bone density: Never  Lipid panel: October 2014  Allergies  Allergen Reactions  . Erythromycin Other (See Comments)    Gums turned red and bled profusely  . Tape Other (See Comments)    The patient's SKIN IS VERY THIN AND TEARS AND BRUISES VERY EASILY!!    Current Outpatient Medications    Medication Sig Dispense Refill  . acetaminophen (TYLENOL) 650 MG CR tablet Take 650 mg by mouth at bedtime.    [name] aspirin 81 MG tablet Take 1 tablet (81 mg total) by mouth daily. 90 tablet 3  . Calcium Carbonate-Vitamin D3 600-400 MG-UNIT TABS Take 1 tablet by mouth 2 (two) times daily. 60 tablet 2  . cetirizine (ZYRTEC) 10 MG tablet Take 10 mg by mouth at bedtime.    . CVS D3 25 MCG (1000 UT) capsule TAKE 1 CAPSULE BY MOUTH EVERY DAY 120 capsule 3  . FLUoxetine (PROZAC) 20 MG capsule TAKE 1 CAPSULE BY MOUTH EVERY DAY 90 capsule 1  . hydroxyurea (HYDREA) 500 MG capsule Take 1 capsule (500 mg total) by mouth daily. May take with food to minimize GI side effects. 90 capsule 3  . saccharomyces boulardii (FLORASTOR) 250 MG capsule Take 1 capsule (250 mg total) by mouth 2 (two) times daily.    . sodium chloride (OCEAN) 0.65 % nasal spray Place 1 spray into the nose 4 (four) times daily as needed for congestion.     [name] terazosin (HYTRIN) 2 MG capsule TAKE 1 CAPSULE (2 MG TOTAL) BY MOUTH AT BEDTIME. 90 capsule 3   Current Facility-Administered Medications  Medication Dose Route Frequency Provider Last Rate Last Admin  . Darbepoetin Alfa (ARANESP) injection 300 mcg  300 mcg Subcutaneous Once Hannan Tetzlaff, Virgie Dad, MD        OBJECTIVE: Elderly white male who appears stated age 85:   06/12/19 1311  BP: (!) 128/59  [name]: 72  Resp: 18  Temp: 98.7 F (37.1 C)  SpO2: 98%    Body mass index is 21.89 kg/m.  Filed Weights   06/12/19 1311  Weight: 170 lb 8 oz (77.3 kg)     ECOG FS:2 - Symptomatic, <50% confined to bed  Sclerae unicteric, EOMs intact Wearing a mask No cervical or supraclavicular adenopathy Lungs no rales or rhonchi Heart regular rate and rhythm Abd soft, nontender, positive bowel sounds MSK no focal spinal tenderness Neuro: nonfocal, well oriented, appropriate affect    LAB RESULTS:  CBC    Component Value Date/Time   WBC 13.7 (H) 06/12/2019 1247   RBC 2.64 (L)  06/12/2019 1247   HGB 10.0 (L) 06/12/2019 1247   HGB 10.0 (L) 03/20/2019 1355   HGB 11.4 (L) 02/23/2019 1529   HGB 8.7 (L) 06/03/2017 1404   HCT 30.3 (L) 06/12/2019 1247   HCT 32.8 (L) 02/23/2019 1529   HCT 27.6 (L) 06/03/2017 1404   PLT 336 06/12/2019 1247   PLT 350 03/20/2019 1355   PLT 346 02/23/2019 1529   MCV 114.8 (H) 06/12/2019 1247   MCV 102 (H) 02/23/2019 1529   MCV 103.8 (H) 06/03/2017 1404   MCH  37.9 (H) 06/12/2019 1247   MCHC 33.0 06/12/2019 1247   RDW 17.7 (H) 06/12/2019 1247   RDW 16.8 (H) 02/23/2019 1529   RDW 20.5 (H) 06/03/2017 1404   LYMPHSABS 2.2 06/12/2019 1247   LYMPHSABS 2.7 02/23/2019 1529   LYMPHSABS 2.7 06/03/2017 1404   MONOABS 1.7 (H) 06/12/2019 1247   MONOABS 2.2 (H) 06/03/2017 1404   EOSABS 0.0 06/12/2019 1247   EOSABS 0.0 02/23/2019 1529   BASOSABS 0.2 (H) 06/12/2019 1247   BASOSABS 0.4 (H) 02/23/2019 1529   BASOSABS 0.5 (H) 06/03/2017 1404      Chemistry      Component Value Date/Time   NA 142 06/12/2019 1247   NA 140 02/23/2019 1529   NA 142 06/03/2017 1404   K 4.3 06/12/2019 1247   K 3.9 06/03/2017 1404   CL 109 06/12/2019 1247   CO2 26 06/12/2019 1247   CO2 26 06/03/2017 1404   BUN 25 (H) 06/12/2019 1247   BUN 26 02/23/2019 1529   BUN 19.9 06/03/2017 1404   CREATININE 1.50 (H) 06/12/2019 1247   CREATININE 1.6 (H) 06/03/2017 1404      Component Value Date/Time   CALCIUM 8.6 (L) 06/12/2019 1247   CALCIUM 8.9 06/03/2017 1404   ALKPHOS 76 06/12/2019 1247   ALKPHOS 91 06/03/2017 1404   AST 23 06/12/2019 1247   AST 20 06/03/2017 1404   ALT 18 06/12/2019 1247   ALT 12 06/03/2017 1404   BILITOT 0.5 06/12/2019 1247   BILITOT 0.48 06/03/2017 1404       STUDIES: No results found.    ASSESSMENT: 85 y.o.  [place] man with   (1) essential thrombocytosis initially diagnosed December 2003,  controlled on anagrelide 0.5 mg daily and aspirin 81 mg daily initially, stopped 06/2017  (a) no evidence of leukemic transformation  (b)  Hydrea 569m daily starting in 07/2017  (2) anemia, secondary to chronic kidney disease, receiving aranesp 3058m every 6 weeks  (3) possible transition to myelofibrosis on bone marrow biopsy 06/22/2017,  (a) normal cytogenetics  (b) FISH WNL   PLAN: Nicko is doing just fine from a hematology point of view.  His platelet count is normal.  He has an adequate and even more than adequate white cell count, and his hemoglobin at nadir today is 10.0.  It really does not get any better than that.  Accordingly we are continuing his Aranesp every 3 weeks at the current dose.  He receives the treatment anytime the hemoglobin is less than 11.  Because of the experience he had in the back when he was phlebotomized by mistake he really prefers to receive the iron as shot here in the clinic room.  I am happy to accommodate that for him  He is high fall risk and we discussed that today.  I am also concerned that he does not have a healthcare power of attorney document in place and I gave him the appropriate papers to complete.  He will return to see [place] in 6 weeks.  He knows to call for any other issue that may develop before then.  GuVirgie DadMagrinat, MD  06/12/19 1:52 PM [facility] [id number]. [phone]  Fax. [phone] I, KaWilburn Mylaram acting as scribe for Dr. GuVirgie DadMagrinat.  I, GuLurline DelD, have reviewed the above documentation for accuracy and completeness, and I agree with the above.

## 2019-06-12 ENCOUNTER — Inpatient Hospital Stay: Payer: Medicare Other | Attending: Oncology | Admitting: Oncology

## 2019-06-12 ENCOUNTER — Other Ambulatory Visit: Payer: Self-pay

## 2019-06-12 ENCOUNTER — Inpatient Hospital Stay: Payer: Medicare Other

## 2019-06-12 VITALS — BP 128/59 | HR 72 | Temp 98.7°F | Resp 18 | Ht 74.0 in | Wt 170.5 lb

## 2019-06-12 DIAGNOSIS — N183 Chronic kidney disease, stage 3 unspecified: Secondary | ICD-10-CM

## 2019-06-12 DIAGNOSIS — N1831 Chronic kidney disease, stage 3a: Secondary | ICD-10-CM | POA: Diagnosis not present

## 2019-06-12 DIAGNOSIS — D473 Essential (hemorrhagic) thrombocythemia: Secondary | ICD-10-CM | POA: Diagnosis not present

## 2019-06-12 DIAGNOSIS — N189 Chronic kidney disease, unspecified: Secondary | ICD-10-CM | POA: Insufficient documentation

## 2019-06-12 DIAGNOSIS — D631 Anemia in chronic kidney disease: Secondary | ICD-10-CM

## 2019-06-12 DIAGNOSIS — R161 Splenomegaly, not elsewhere classified: Secondary | ICD-10-CM

## 2019-06-12 LAB — CMP (CANCER CENTER ONLY)
ALT: 18 U/L (ref 0–44)
AST: 23 U/L (ref 15–41)
Albumin: 3.7 g/dL (ref 3.5–5.0)
Alkaline Phosphatase: 76 U/L (ref 38–126)
Anion gap: 7 (ref 5–15)
BUN: 25 mg/dL — ABNORMAL HIGH (ref 8–23)
CO2: 26 mmol/L (ref 22–32)
Calcium: 8.6 mg/dL — ABNORMAL LOW (ref 8.9–10.3)
Chloride: 109 mmol/L (ref 98–111)
Creatinine: 1.5 mg/dL — ABNORMAL HIGH (ref 0.61–1.24)
GFR, Est AFR Am: 47 mL/min — ABNORMAL LOW (ref >60)
GFR, Estimated: 41 mL/min — ABNORMAL LOW (ref >60)
Glucose, Bld: 94 mg/dL (ref 70–99)
Potassium: 4.3 mmol/L (ref 3.5–5.1)
Sodium: 142 mmol/L (ref 135–145)
Total Bilirubin: 0.5 mg/dL (ref 0.3–1.2)
Total Protein: 7.1 g/dL (ref 6.5–8.1)

## 2019-06-12 LAB — CBC WITH DIFFERENTIAL/PLATELET
Abs Immature Granulocytes: 1.19 10*3/uL — ABNORMAL HIGH (ref 0.00–0.07)
Basophils Absolute: 0.2 10*3/uL — ABNORMAL HIGH (ref 0.0–0.1)
Basophils Relative: 1 %
Eosinophils Absolute: 0 10*3/uL (ref 0.0–0.5)
Eosinophils Relative: 0 %
HCT: 30.3 % — ABNORMAL LOW (ref 39.0–52.0)
Hemoglobin: 10 g/dL — ABNORMAL LOW (ref 13.0–17.0)
Immature Granulocytes: 9 %
Lymphocytes Relative: 16 %
Lymphs Abs: 2.2 10*3/uL (ref 0.7–4.0)
MCH: 37.9 pg — ABNORMAL HIGH (ref 26.0–34.0)
MCHC: 33 g/dL (ref 30.0–36.0)
MCV: 114.8 fL — ABNORMAL HIGH (ref 80.0–100.0)
Monocytes Absolute: 1.7 10*3/uL — ABNORMAL HIGH (ref 0.1–1.0)
Monocytes Relative: 13 %
Neutro Abs: 8.3 10*3/uL — ABNORMAL HIGH (ref 1.7–7.7)
Neutrophils Relative %: 61 %
Platelets: 336 10*3/uL (ref 150–400)
RBC: 2.64 MIL/uL — ABNORMAL LOW (ref 4.22–5.81)
RDW: 17.7 % — ABNORMAL HIGH (ref 11.5–15.5)
WBC: 13.7 10*3/uL — ABNORMAL HIGH (ref 4.0–10.5)
nRBC: 0.8 % — ABNORMAL HIGH (ref 0.0–0.2)

## 2019-06-12 MED ORDER — DARBEPOETIN ALFA 300 MCG/0.6ML IJ SOSY
PREFILLED_SYRINGE | INTRAMUSCULAR | Status: AC
  Filled 2019-06-12: qty 0.6

## 2019-06-12 MED ORDER — DARBEPOETIN ALFA 300 MCG/0.6ML IJ SOSY
300.0000 ug | PREFILLED_SYRINGE | Freq: Once | INTRAMUSCULAR | Status: AC
  Administered 2019-06-12: 14:00:00 300 ug via SUBCUTANEOUS

## 2019-06-12 NOTE — Patient Instructions (Signed)

## 2019-06-14 ENCOUNTER — Telehealth: Payer: Self-pay | Admitting: Oncology

## 2019-06-14 NOTE — Telephone Encounter (Signed)
Scheduled per 12/29 los, patient's daughter is notified.

## 2019-06-27 DIAGNOSIS — H353211 Exudative age-related macular degeneration, right eye, with active choroidal neovascularization: Secondary | ICD-10-CM | POA: Diagnosis not present

## 2019-07-09 ENCOUNTER — Other Ambulatory Visit: Payer: Self-pay

## 2019-07-09 ENCOUNTER — Encounter (HOSPITAL_COMMUNITY): Payer: Self-pay | Admitting: Ophthalmology

## 2019-07-09 ENCOUNTER — Other Ambulatory Visit (HOSPITAL_COMMUNITY)
Admission: RE | Admit: 2019-07-09 | Discharge: 2019-07-09 | Disposition: A | Discharge status: Home / Self Care | Payer: Medicare Other | Source: Ambulatory Visit | Attending: Ophthalmology | Admitting: Ophthalmology

## 2019-07-09 DIAGNOSIS — Z20822 Contact with and (suspected) exposure to covid-19: Secondary | ICD-10-CM | POA: Insufficient documentation

## 2019-07-09 DIAGNOSIS — Z01812 Encounter for preprocedural laboratory examination: Secondary | ICD-10-CM | POA: Diagnosis not present

## 2019-07-09 LAB — SARS CORONAVIRUS 2 (TAT 6-24 HRS): SARS Coronavirus 2: NEGATIVE

## 2019-07-09 NOTE — Progress Notes (Signed)
SDW-pre-op call completed by pt daughter, Ivin Booty.  Daughter denies that pt C/O SOB and chest pain. Daughter denies that pt is under the care of a acrdiologist. Daughter denies that pt had a stress test and cardiac cath. Daughter denies that pt hd a chest x ray in the last year. Daughter denies recent labs. Daughter stated that pt was not instructed to stop taking Aspirin; last dose was 07/09/19. Daughter made aware to have pt stop taking vitamins, fish oil and herbal medications. Do not take any NSAIDs ie: Ibuprofen, Advil, Naproxen (Aleve), Motrin, BC and Goody Powder. Daughter instructed to bring Hydea in on DOS ( can cause nausea if taken on an empty stomach). Myra Gianotti, PA, Anesthesiology, made aware that that pt daughter was instructed to bring in Hydea on DOS; no new orders. Daughter made aware to have pt to continue to quarantine. Daughter verbalized understanding of all pre-op instructions. PA, Anesthesiology, asked to review pt history.

## 2019-07-09 NOTE — Anesthesia Preprocedure Evaluation (Addendum)
Anesthesia Evaluation  Patient identified by MRN, date of birth, ID band Patient awake    Reviewed: Allergy & Precautions, NPO status , Patient's Chart, lab work & pertinent test results  History of Anesthesia Complications Negative for: history of anesthetic complications  Airway Mallampati: II  TM Distance: >3 FB Neck ROM: Full    Dental  (+) Dental Advisory Given, Partial Upper   Pulmonary COPD,  COPD inhaler, former smoker,    Pulmonary exam normal        Cardiovascular hypertension, + Peripheral Vascular Disease  Normal cardiovascular exam     Neuro/Psych  Headaches, PSYCHIATRIC DISORDERS Depression    GI/Hepatic negative GI ROS, Neg liver ROS,   Endo/Other  negative endocrine ROS  Renal/GU CRFRenal disease     Musculoskeletal negative musculoskeletal ROS (+)   Abdominal   Peds  Hematology  Thrombocytosis    Anesthesia Other Findings Covid neg 1/25   Reproductive/Obstetrics                            Anesthesia Physical Anesthesia Plan  ASA: II  Anesthesia Plan: General   Post-op Pain Management:    Induction: Intravenous  PONV Risk Score and Plan: 2 and Treatment may vary due to age or medical condition and Ondansetron  Airway Management Planned: Oral ETT  Additional Equipment: None  Intra-op Plan:   Post-operative Plan: Extubation in OR  Informed Consent: I have reviewed the patients History and Physical, chart, labs and discussed the procedure including the risks, benefits and alternatives for the proposed anesthesia with the patient or authorized representative who has indicated his/her understanding and acceptance.     Dental advisory given  Plan Discussed with: CRNA, Anesthesiologist and Surgeon  Anesthesia Plan Comments:      Anesthesia Quick Evaluation

## 2019-07-09 NOTE — Progress Notes (Signed)
Nurse IB MD to make aware that pt daughter stated that pt was not provided with pre-op Aspirin instructions and last dose of Aspirin was taken on 07/09/19.

## 2019-07-09 NOTE — Progress Notes (Signed)
Anesthesia Chart Review: Nicholas [name]   Case: [id number] Date/Time: 07/10/19 1215   Procedure: PARS PLANA VITRECTOMY WITH INTRAOCULAR LENS LEFT EYE (Left )   Anesthesia type: Monitor Anesthesia Care   Pre-op diagnosis: APHAKIA LEFT EYE, OTHER VITREOUS OPACITIES LEFT EYE   Location: [facility] OR ROOM 08 / [facility]   Surgeons: Nicholas Mullet, MD      DISCUSSION: Patient is an 86 year old male scheduled for the above procedure.  History includes former smoker (quit 1960), HTN, thrombocytosis (on hydroxyurea), skin cancer (SCC, left forearm, s/p excision 2004; [place] left forehead 06/29/13; "melanoma"), anemia of chronic kidney disease (on Aranesp), macular degeneration.   Notes indicate that his daughter Nicholas [name] works for [employer]. 07/09/19 COVID-19 test is in process. Case is posted as priority 1. Anesthesia team to evaluate on the day of surgery.    VS: BP Readings from Last 3 Encounters:  06/12/19 (!) 128/59  05/01/19 (!) 126/59  03/20/19 (!) 118/52   [name] Readings from Last 3 Encounters:  06/12/19 72  05/01/19 78  03/20/19 79    PROVIDERS: Nicholas Lung, MD is PCP Nicholas [name], Nicholas Jews, MD is HEM. Last visit 06/12/19. He was doing well from a hematologic standpoint at that time. Nicholas Burow, MD is cardiologist. Seen on 02/07/18 for HTN with multiple fall history. Notes indicate fall were all "explainable" and without symptoms of syncope. Dr. Gwenlyn [name] did not feel that falls were related to any cardiovascular etiology.   LABS: He is for updated labs on arrival. As of 06/12/19, lab results included: Lab Results  Component Value Date   WBC 13.7 (H) 06/12/2019   HGB 10.0 (L) 06/12/2019   HCT 30.3 (L) 06/12/2019   PLT 336 06/12/2019   GLUCOSE 94 06/12/2019   ALT 18 06/12/2019   AST 23 06/12/2019   NA 142 06/12/2019   K 4.3 06/12/2019   CL 109 06/12/2019   CREATININE 1.50 (H) 06/12/2019   BUN 25 (H) 06/12/2019   CO2 26 06/12/2019   TSH 5.820 (H) 02/23/2019   Cr has ranged from  1.42-1.72, PLT count 336-465and HGB 9.5-12.6 since 11/2018.     EKG: 02/23/19: NSR   CV: I do not have the report, but history indicated that a 01/04/07 echo showed: "LV function normal, 55-60% EF, mildly increased thickness of LV wall".    Past Medical History:  Diagnosis Date  . Abnormal CT scan, sinus 10/04/06   pansinusitis, mildly deviated septum  . Allergy   . Anemia    related to CKD  . Cancer ([place])    melanoma skin cancer  . Chronic sinusitis   . Dysthymia    dysthymia  . H/O echocardiogram 01/04/2007   LV function normal, 55-60% EF, mildly increased thickenss of LV wall  . H/O echocardiogram 12/2006   mild LV wall thickening, EF 55-60%, LV function normal  . Headache     " sinus"  . History of MRI of brain and brain stem 02/26/06   sinusitis, nonspecific deep white matter changes normal for age  . Hypertension   . Macular degeneration    B/L  . Seborrheic keratosis    sees Nicholas Arthurs, NP dermatology  . Thrombocytosis [facility])    sees hematology    Past Surgical History:  Procedure Laterality Date  . EYE SURGERY    . HERNIA REPAIR     right inguinal repair  . I & D EXTREMITY Right 09/16/2017   Procedure: IRRIGATION AND DEBRIDEMENT EXTREMITY;  Surgeon: Nicholas Kaufman, MD;  Location: [facility];  Service: Orthopedics;  Laterality: Right;  . TONSILLECTOMY     childhood  . VARICOSE VEIN SURGERY     right lower leg    MEDICATIONS: No current facility-administered medications for this encounter.   [name] acetaminophen (TYLENOL) 650 MG CR tablet  . aspirin 81 MG tablet  . Calcium Carbonate-Vitamin D3 600-400 MG-UNIT TABS  . cetirizine (ZYRTEC) 10 MG tablet  . CVS D3 25 MCG (1000 UT) capsule  . FLUoxetine (PROZAC) 20 MG capsule  . hydroxyurea (HYDREA) 500 MG capsule  . saccharomyces boulardii (FLORASTOR) 250 MG capsule  . sodium chloride (OCEAN) 0.65 % nasal spray  . terazosin (HYTRIN) 2 MG capsule    Nicholas Gianotti, PA-C Surgical Short Stay/Anesthesiology [facility] Phone  [phone] [facility] Phone [phone] 07/09/2019 3:06 PM

## 2019-07-10 ENCOUNTER — Other Ambulatory Visit: Payer: Self-pay

## 2019-07-10 ENCOUNTER — Ambulatory Visit (HOSPITAL_COMMUNITY): Payer: Medicare Other | Admitting: Certified Registered Nurse Anesthetist

## 2019-07-10 ENCOUNTER — Ambulatory Visit (HOSPITAL_COMMUNITY)
Admission: RE | Admit: 2019-07-10 | Discharge: 2019-07-10 | Disposition: A | Discharge status: Home / Self Care | Payer: Medicare Other | Attending: Ophthalmology | Admitting: Ophthalmology

## 2019-07-10 ENCOUNTER — Encounter (HOSPITAL_COMMUNITY)
Admission: RE | Disposition: A | Discharge status: Home / Self Care | Payer: Self-pay | Source: Home / Self Care | Attending: Ophthalmology

## 2019-07-10 ENCOUNTER — Encounter (HOSPITAL_COMMUNITY): Payer: Self-pay | Admitting: Ophthalmology

## 2019-07-10 DIAGNOSIS — H2702 Aphakia, left eye: Secondary | ICD-10-CM | POA: Diagnosis not present

## 2019-07-10 DIAGNOSIS — H43392 Other vitreous opacities, left eye: Secondary | ICD-10-CM | POA: Diagnosis not present

## 2019-07-10 DIAGNOSIS — Y838 Other surgical procedures as the cause of abnormal reaction of the patient, or of later complication, without mention of misadventure at the time of the procedure: Secondary | ICD-10-CM | POA: Diagnosis not present

## 2019-07-10 DIAGNOSIS — Z7982 Long term (current) use of aspirin: Secondary | ICD-10-CM | POA: Diagnosis not present

## 2019-07-10 DIAGNOSIS — F329 Major depressive disorder, single episode, unspecified: Secondary | ICD-10-CM | POA: Diagnosis not present

## 2019-07-10 DIAGNOSIS — Z79899 Other long term (current) drug therapy: Secondary | ICD-10-CM | POA: Insufficient documentation

## 2019-07-10 DIAGNOSIS — T8522XA Displacement of intraocular lens, initial encounter: Secondary | ICD-10-CM | POA: Insufficient documentation

## 2019-07-10 DIAGNOSIS — D631 Anemia in chronic kidney disease: Secondary | ICD-10-CM | POA: Insufficient documentation

## 2019-07-10 DIAGNOSIS — Z87891 Personal history of nicotine dependence: Secondary | ICD-10-CM | POA: Diagnosis not present

## 2019-07-10 DIAGNOSIS — I739 Peripheral vascular disease, unspecified: Secondary | ICD-10-CM | POA: Insufficient documentation

## 2019-07-10 DIAGNOSIS — I129 Hypertensive chronic kidney disease with stage 1 through stage 4 chronic kidney disease, or unspecified chronic kidney disease: Secondary | ICD-10-CM | POA: Diagnosis not present

## 2019-07-10 DIAGNOSIS — N183 Chronic kidney disease, stage 3 unspecified: Secondary | ICD-10-CM | POA: Insufficient documentation

## 2019-07-10 DIAGNOSIS — J449 Chronic obstructive pulmonary disease, unspecified: Secondary | ICD-10-CM | POA: Insufficient documentation

## 2019-07-10 DIAGNOSIS — Z85820 Personal history of malignant melanoma of skin: Secondary | ICD-10-CM | POA: Diagnosis not present

## 2019-07-10 HISTORY — DX: Headache, unspecified: R51.9

## 2019-07-10 HISTORY — DX: Unspecified macular degeneration: H35.30

## 2019-07-10 HISTORY — PX: PARS PLANA VITRECTOMY: SHX2166

## 2019-07-10 HISTORY — DX: Malignant (primary) neoplasm, unspecified: C80.1

## 2019-07-10 LAB — CBC
HCT: 31.3 % — ABNORMAL LOW (ref 39.0–52.0)
Hemoglobin: 10.5 g/dL — ABNORMAL LOW (ref 13.0–17.0)
MCH: 39.6 pg — ABNORMAL HIGH (ref 26.0–34.0)
MCHC: 33.5 g/dL (ref 30.0–36.0)
MCV: 118.1 fL — ABNORMAL HIGH (ref 80.0–100.0)
Platelets: 423 10*3/uL — ABNORMAL HIGH (ref 150–400)
RBC: 2.65 MIL/uL — ABNORMAL LOW (ref 4.22–5.81)
RDW: 19.8 % — ABNORMAL HIGH (ref 11.5–15.5)
WBC: 14.9 10*3/uL — ABNORMAL HIGH (ref 4.0–10.5)
nRBC: 0.3 % — ABNORMAL HIGH (ref 0.0–0.2)

## 2019-07-10 LAB — BASIC METABOLIC PANEL
Anion gap: 10 (ref 5–15)
BUN: 34 mg/dL — ABNORMAL HIGH (ref 8–23)
CO2: 22 mmol/L (ref 22–32)
Calcium: 8.8 mg/dL — ABNORMAL LOW (ref 8.9–10.3)
Chloride: 108 mmol/L (ref 98–111)
Creatinine, Ser: 1.69 mg/dL — ABNORMAL HIGH (ref 0.61–1.24)
GFR calc Af Amer: 41 mL/min — ABNORMAL LOW (ref >60)
GFR calc non Af Amer: 35 mL/min — ABNORMAL LOW (ref >60)
Glucose, Bld: 91 mg/dL (ref 70–99)
Potassium: 4.2 mmol/L (ref 3.5–5.1)
Sodium: 140 mmol/L (ref 135–145)

## 2019-07-10 SURGERY — PARS PLANA VITRECTOMY WITH 25G REMOVAL/SUTURE INTRAOCULAR LENS
Anesthesia: Monitor Anesthesia Care | Site: Eye | Laterality: Left

## 2019-07-10 MED ORDER — EPINEPHRINE PF 1 MG/ML IJ SOLN
INTRAOCULAR | Status: DC | PRN
  Administered 2019-07-10: 500.3 mL

## 2019-07-10 MED ORDER — ONDANSETRON HCL 4 MG/2ML IJ SOLN: 4.0000 mg | Freq: Once | INTRAMUSCULAR | Status: DC | PRN

## 2019-07-10 MED ORDER — TOBRAMYCIN-DEXAMETHASONE 0.3-0.1 % OP OINT
TOPICAL_OINTMENT | OPHTHALMIC | Status: DC | PRN
  Administered 2019-07-10: 1 via OPHTHALMIC

## 2019-07-10 MED ORDER — PHENYLEPHRINE HCL 2.5 % OP SOLN
1.0000 [drp] | OPHTHALMIC | Status: AC | PRN
  Administered 2019-07-10 (×3): 1 [drp] via OPHTHALMIC
  Filled 2019-07-10: qty 2

## 2019-07-10 MED ORDER — NA CHONDROIT SULF-NA HYALURON 40-30 MG/ML IO SOLN
INTRAOCULAR | Status: AC
  Filled 2019-07-10: qty 0.5

## 2019-07-10 MED ORDER — LIDOCAINE HCL (PF) 2 % IJ SOLN
INTRAMUSCULAR | Status: AC
  Filled 2019-07-10: qty 10

## 2019-07-10 MED ORDER — FENTANYL CITRATE (PF) 100 MCG/2ML IJ SOLN: 25.0000 ug | INTRAMUSCULAR | Status: DC | PRN

## 2019-07-10 MED ORDER — OXYCODONE HCL 5 MG/5ML PO SOLN: 5.0000 mg | Freq: Once | ORAL | Status: DC | PRN

## 2019-07-10 MED ORDER — CYCLOPENTOLATE HCL 1 % OP SOLN
1.0000 [drp] | OPHTHALMIC | Status: AC | PRN
  Administered 2019-07-10 (×3): 1 [drp] via OPHTHALMIC
  Filled 2019-07-10: qty 2

## 2019-07-10 MED ORDER — PROPARACAINE HCL 0.5 % OP SOLN
1.0000 [drp] | OPHTHALMIC | Status: AC | PRN
  Administered 2019-07-10 (×2): 1 [drp] via OPHTHALMIC
  Filled 2019-07-10: qty 15

## 2019-07-10 MED ORDER — OFLOXACIN 0.3 % OP SOLN
1.0000 [drp] | OPHTHALMIC | Status: AC | PRN
  Administered 2019-07-10 (×2): 1 [drp] via OPHTHALMIC
  Filled 2019-07-10: qty 5

## 2019-07-10 MED ORDER — LIDOCAINE HCL 2 % IJ SOLN: INTRAMUSCULAR | Status: DC | PRN

## 2019-07-10 MED ORDER — FENTANYL CITRATE (PF) 250 MCG/5ML IJ SOLN
INTRAMUSCULAR | Status: AC
  Filled 2019-07-10: qty 5

## 2019-07-10 MED ORDER — PHENYLEPHRINE HCL 10 % OP SOLN
1.0000 [drp] | OPHTHALMIC | Status: AC
  Administered 2019-07-10: 1 [drp] via OPHTHALMIC
  Filled 2019-07-10: qty 5

## 2019-07-10 MED ORDER — PHENYLEPHRINE 40 MCG/ML (10ML) SYRINGE FOR IV PUSH (FOR BLOOD PRESSURE SUPPORT)
PREFILLED_SYRINGE | INTRAVENOUS | Status: AC
  Filled 2019-07-10: qty 10

## 2019-07-10 MED ORDER — SODIUM CHLORIDE 0.9 % IV SOLN: INTRAVENOUS | Status: DC

## 2019-07-10 MED ORDER — ONDANSETRON HCL 4 MG/2ML IJ SOLN
INTRAMUSCULAR | Status: DC | PRN
  Administered 2019-07-10: 4 mg via INTRAVENOUS

## 2019-07-10 MED ORDER — DEXAMETHASONE SODIUM PHOSPHATE 10 MG/ML IJ SOLN
INTRAMUSCULAR | Status: DC | PRN
  Administered 2019-07-10: 10 mg

## 2019-07-10 MED ORDER — ROCURONIUM BROMIDE 100 MG/10ML IV SOLN
INTRAVENOUS | Status: DC | PRN
  Administered 2019-07-10: 20 mg via INTRAVENOUS
  Administered 2019-07-10 (×3): 10 mg via INTRAVENOUS
  Administered 2019-07-10: 50 mg via INTRAVENOUS

## 2019-07-10 MED ORDER — HYPROMELLOSE (GONIOSCOPIC) 2.5 % OP SOLN
OPHTHALMIC | Status: DC | PRN
  Administered 2019-07-10: 1 [drp] via OPHTHALMIC

## 2019-07-10 MED ORDER — OXYCODONE HCL 5 MG PO TABS: 5.0000 mg | ORAL_TABLET | Freq: Once | ORAL | Status: DC | PRN

## 2019-07-10 MED ORDER — PHENYLEPHRINE HCL-NACL 10-0.9 MG/250ML-% IV SOLN
INTRAVENOUS | Status: DC | PRN
  Administered 2019-07-10: 25 ug/min via INTRAVENOUS

## 2019-07-10 MED ORDER — SUGAMMADEX SODIUM 200 MG/2ML IV SOLN
INTRAVENOUS | Status: DC | PRN
  Administered 2019-07-10: 200 mg via INTRAVENOUS

## 2019-07-10 MED ORDER — DEXAMETHASONE SODIUM PHOSPHATE 10 MG/ML IJ SOLN
INTRAMUSCULAR | Status: AC
  Filled 2019-07-10: qty 1

## 2019-07-10 MED ORDER — LIDOCAINE 2% (20 MG/ML) 5 ML SYRINGE
INTRAMUSCULAR | Status: AC
  Filled 2019-07-10: qty 5

## 2019-07-10 MED ORDER — STERILE WATER FOR INJECTION IJ SOLN
INTRAMUSCULAR | Status: AC
  Filled 2019-07-10: qty 10

## 2019-07-10 MED ORDER — CEFAZOLIN SUBCONJUNCTIVAL INJECTION 100 MG/0.5 ML
200.0000 mg | INJECTION | SUBCONJUNCTIVAL | Status: AC
  Administered 2019-07-10: 13:00:00 200 mg via SUBCONJUNCTIVAL
  Filled 2019-07-10: qty 5

## 2019-07-10 MED ORDER — ARTIFICIAL TEARS OPHTHALMIC OINT
TOPICAL_OINTMENT | OPHTHALMIC | Status: AC
  Filled 2019-07-10: qty 7

## 2019-07-10 MED ORDER — ROCURONIUM BROMIDE 10 MG/ML (PF) SYRINGE
PREFILLED_SYRINGE | INTRAVENOUS | Status: AC
  Filled 2019-07-10: qty 10

## 2019-07-10 MED ORDER — PROPOFOL 10 MG/ML IV BOLUS
INTRAVENOUS | Status: AC
  Filled 2019-07-10: qty 20

## 2019-07-10 MED ORDER — ONDANSETRON HCL 4 MG/2ML IJ SOLN
INTRAMUSCULAR | Status: AC
  Filled 2019-07-10: qty 4

## 2019-07-10 MED ORDER — TETRACAINE HCL 0.5 % OP SOLN
OPHTHALMIC | Status: AC
  Filled 2019-07-10: qty 4

## 2019-07-10 MED ORDER — FENTANYL CITRATE (PF) 250 MCG/5ML IJ SOLN
INTRAMUSCULAR | Status: DC | PRN
  Administered 2019-07-10: 50 ug via INTRAVENOUS

## 2019-07-10 MED ORDER — PROPOFOL 10 MG/ML IV BOLUS
INTRAVENOUS | Status: DC | PRN
  Administered 2019-07-10: 80 mg via INTRAVENOUS
  Administered 2019-07-10: 20 mg via INTRAVENOUS

## 2019-07-10 MED ORDER — LIDOCAINE 2% (20 MG/ML) 5 ML SYRINGE
INTRAMUSCULAR | Status: DC | PRN
  Administered 2019-07-10: 60 mg via INTRAVENOUS

## 2019-07-10 MED ORDER — NA CHONDROIT SULF-NA HYALURON 40-30 MG/ML IO SOLN
INTRAOCULAR | Status: DC | PRN
  Administered 2019-07-10: 0.5 mL via INTRAOCULAR

## 2019-07-10 SURGICAL SUPPLY — 68 items
APPLICATOR COTTON TIP 6 STRL (MISCELLANEOUS) ×2 IMPLANT
APPLICATOR COTTON TIP 6IN STRL (MISCELLANEOUS) ×4
BAND WRIST GAS GREEN (MISCELLANEOUS) IMPLANT
BLADE KERATOME 2.75 (BLADE) ×3 IMPLANT
BLADE KERATOME 2.75MM (BLADE) ×1
BLADE MVR KNIFE 20G (BLADE) IMPLANT
BLADE STAB KNIFE 15DEG (BLADE) IMPLANT
BLADE SUPER 15 ALCON (BLADE) ×4 IMPLANT
CABLE BIPOLOR RESECTION CORD (MISCELLANEOUS) ×4 IMPLANT
CANNULA ANT CHAM MAIN (OPHTHALMIC RELATED) IMPLANT
CANNULA DUAL BORE 23G (CANNULA) IMPLANT
CANNULA DUALBORE 25G (CANNULA) IMPLANT
CANNULA VLV SOFT TIP 25GA (OPHTHALMIC) ×4 IMPLANT
CAUTERY EYE LOW TEMP 1300F FIN (OPHTHALMIC RELATED) IMPLANT
CLOSURE STERI-STRIP 1/2X4 (GAUZE/BANDAGES/DRESSINGS) ×1
CLSR STERI-STRIP ANTIMIC 1/2X4 (GAUZE/BANDAGES/DRESSINGS) ×3 IMPLANT
COVER MAYO STAND STRL (DRAPES) IMPLANT
DRAPE HALF SHEET 40X57 (DRAPES) ×4 IMPLANT
DRAPE INCISE 51X51 W/FILM STRL (DRAPES) IMPLANT
DRAPE RETRACTOR (MISCELLANEOUS) ×4 IMPLANT
ERASER HMR WETFIELD 23G BP (MISCELLANEOUS) IMPLANT
FORCEPS ECKARDT ILM 25G SERR (OPHTHALMIC RELATED) IMPLANT
FORCEPS GRIESHABER ILM 25G A (INSTRUMENTS) IMPLANT
FORCEPS GRIESHABER MAX 25G (MISCELLANEOUS) ×8 IMPLANT
GAS AUTO FILL CONSTEL (OPHTHALMIC) ×4
GAS AUTO FILL CONSTELLATION (OPHTHALMIC) ×2 IMPLANT
GAS WRIST BAND GREEN (MISCELLANEOUS)
GLOVE SURG SYN 7.5  E (GLOVE) ×4
GLOVE SURG SYN 7.5 E (GLOVE) ×4 IMPLANT
GOWN STRL REUS W/ TWL LRG LVL3 (GOWN DISPOSABLE) ×8 IMPLANT
GOWN STRL REUS W/TWL LRG LVL3 (GOWN DISPOSABLE) ×8
KIT BASIN OR (CUSTOM PROCEDURE TRAY) ×4 IMPLANT
KIT TURNOVER KIT B (KITS) ×4 IMPLANT
KNIFE CRESCENT 1.75 EDGEAHEAD (BLADE) ×4 IMPLANT
LENS BIOM SUPER VIEW SET DISP (MISCELLANEOUS) ×4 IMPLANT
LENS IOL AKREOS 19.0 (Intraocular Lens) ×4 IMPLANT
LENS IOL AKREOS AO 19.0 (Intraocular Lens) ×2 IMPLANT
MICROPICK 25G (MISCELLANEOUS)
NEEDLE 18GX1X1/2 (RX/OR ONLY) (NEEDLE) ×4 IMPLANT
NEEDLE 25GX 5/8IN NON SAFETY (NEEDLE) ×4 IMPLANT
NEEDLE FILTER BLUNT 18X 1/2SAF (NEEDLE) ×4
NEEDLE FILTER BLUNT 18X1 1/2 (NEEDLE) ×4 IMPLANT
NEEDLE HYPO 25GX1X1/2 BEV (NEEDLE) IMPLANT
NEEDLE HYPO 30X.5 LL (NEEDLE) ×8 IMPLANT
NEEDLE RETROBULBAR 25GX1.5 (NEEDLE) ×4 IMPLANT
NS IRRIG 1000ML POUR BTL (IV SOLUTION) ×4 IMPLANT
PACK FRAGMATOME (OPHTHALMIC) IMPLANT
PACK VITRECTOMY CUSTOM (CUSTOM PROCEDURE TRAY) ×4 IMPLANT
PAD ARMBOARD 7.5X6 YLW CONV (MISCELLANEOUS) ×8 IMPLANT
PAK PIK VITRECTOMY CVS 25GA (OPHTHALMIC) ×4 IMPLANT
PENCIL BIPOLAR 25GA STR DISP (OPHTHALMIC RELATED) IMPLANT
PICK MICROPICK 25G (MISCELLANEOUS) IMPLANT
PROBE LASER ILLUM FLEX CVD 25G (OPHTHALMIC) ×4 IMPLANT
ROLLS DENTAL (MISCELLANEOUS) IMPLANT
SCRAPER DIAMOND 25GA (OPHTHALMIC RELATED) IMPLANT
SOL ANTI FOG 6CC (MISCELLANEOUS) ×2 IMPLANT
SOLUTION ANTI FOG 6CC (MISCELLANEOUS) ×2
SPEAR EYE SURG WECK-CEL (MISCELLANEOUS) ×28 IMPLANT
STOPCOCK 4 WAY LG BORE MALE ST (IV SETS) IMPLANT
SUT GORETEX CV 8 TT 9 (SUTURE) ×4 IMPLANT
SUT VICRYL 7 0 TG140 8 (SUTURE) IMPLANT
SUT VICRYL 8 0 TG140 8 (SUTURE) ×4 IMPLANT
SYR 10ML LL (SYRINGE) IMPLANT
SYR 20ML LL LF (SYRINGE) ×4 IMPLANT
SYR 5ML LL (SYRINGE) ×4 IMPLANT
SYR TB 1ML LUER SLIP (SYRINGE) IMPLANT
WATER STERILE IRR 1000ML POUR (IV SOLUTION) ×4 IMPLANT
WIPE INSTRUMENT VISIWIPE 73X73 (MISCELLANEOUS) IMPLANT

## 2019-07-10 NOTE — Op Note (Signed)
Nicholas T Shaddock Jr. 07/10/2019 Diagnosis: Dislocated intraocular lens, vitreous opacities, and aphakia  Procedure: Pars Plana Vitrectomy and removal of posterior implant, placement of sulcus fixated sutured intraocular lens left eye Operative Eye:  left eye  Surgeon: Royston Cowper Estimated Blood Loss: minimal Specimens for Pathology:  None Complications: none   After informed consent was obtained, the patient was brought to the operating room and a time-out confirmed the correct operative eye as the left eye.  Retrobulbar anesthesia was obtained in the left eye without complication.  The left eye was prepped and draped in the usual sterile fashion for intraocular surgery.    Wescott scissors and 0.12 forceps were used to perform a limbal peritomy from 9 o'clock to 5 o'clock.  Calipers were used to locate the placement of Gore-Tex suture.  Trocars were placed at 10 and 4 o'clock 3 mm posterior to the limbus. Two additional sclerotomies were placed 3 mm posterior to the limbus 4 mm from the previously placed trocars.  An additional trocar was placed at 2 o'clock.  The Using the Alcon 25-gauge trocar system, an infusion line and trocar were placed at the 8 o'clock position followed by active trocars both at 10 and 2 o'clock.  A light pipe and vitrector were inserted and a peripheral vitrectomy was performed to limit any traction on the peripheral retina.  Additionally the peripheral retina was evaluated and no tears/breaks were noted.  A superior limbal wound was made with a crescent blade and keratome.  The 3 piece intraocular lens (IOL) was displaced into the anterior chamber with intraocular forceps using hand over hand technique..  The IOL was grasped with [place] IOL forceps and then cut with the [facility] IOL scissors.  The IOL was then removed through the superior corneal wound.  Gore-Tex suture was placed through the haptics of the B&L Akreos lens.  The Gore-Tex suture was then placed  through the corneal wound and externalized through the previously placed sclerotomies and externalized with 25 gauge serrated forceps.  The trocars at 10 and 4 o'clock were removed. The free ends were tied and rotated through the sclerotomies and trimmed.  8-0 Vicryl was used to close the sclerotomies at 10 and 4 o'clock.  Intraocular pressure was noted to be normal after BSS was placed in the eye.  Conjunctiva was reapproximated to the limbus with 8-0 vicryl suture.    Subconjunctival injections of antibiotic and Decadron were placed.  The patient tolerated the procedure well.    The speculum and drapes were removed and the eye was patched with Polymixin/Bacitracin ophthalmic ointment. An eye shield was placed and the patient was transferred alert and conversant with stable vital signs to the post operative recovery area.  The patient tolerated the procedure well and no complications were noted.  FOLLOW-UP CARE: The patient understands the need to follow-up in clinic the following day for postop evaluation.     Royston Cowper MD

## 2019-07-10 NOTE — H&P (Signed)
Date of examination:  07/10/19  Indication for surgery: Aphakia left eye and vitreous opacities left eye  Pertinent past medical history:  Past Medical History:  Diagnosis Date  . Abnormal CT scan, sinus 10/04/06   pansinusitis, mildly deviated septum  . Allergy   . Anemia    related to CKD  . Cancer ([place])    melanoma skin cancer  . Chronic sinusitis   . Dysthymia    dysthymia  . H/O echocardiogram 01/04/2007   LV function normal, 55-60% EF, mildly increased thickenss of LV wall  . H/O echocardiogram 12/2006   mild LV wall thickening, EF 55-60%, LV function normal  . Headache     " sinus"  . History of MRI of brain and brain stem 02/26/06   sinusitis, nonspecific deep white matter changes normal for age  . Hypertension   . Macular degeneration    B/L  . Seborrheic keratosis    sees Toni Arthurs, NP dermatology  . Thrombocytosis ([place])    sees hematology    Pertinent ocular history: aphakia left eye and vitreous opacities with history of exudative macular degeneration left eye  Pertinent family history:  Family History  Problem Relation Age of Onset  . Heart disease Father   . Stroke Father   . Cancer Neg Hx   . COPD Neg Hx   . Diabetes Neg Hx     General:  Healthy appearing patient in no distress.    Eyes:    Acuity OS CF    External: Within normal limits     Anterior segment: Within normal limits       Fundus: subretinal CNVM with thickening     Impression: Aphakia and vitreous opacities left eye  Plan: Pars plana vitrectomy and placement of sutured sulcus fixated IOL left eye  Jalene Mullet, MD

## 2019-07-10 NOTE — Anesthesia Postprocedure Evaluation (Signed)
Anesthesia Post Note  Patient: Nicholas [name].  Procedure(s) Performed: Pars Plana Vitrectomy With 25g Removal/Suture Intraocular Lens (Left Eye)     Patient location during evaluation: PACU Anesthesia Type: General Level of consciousness: awake and alert Pain management: pain level controlled Vital Signs Assessment: post-procedure vital signs reviewed and stable Respiratory status: spontaneous breathing, nonlabored ventilation and respiratory function stable Cardiovascular status: blood pressure returned to baseline and stable Postop Assessment: no apparent nausea or vomiting Anesthetic complications: no    Last Vitals:  Vitals:   07/10/19 1618 07/10/19 1633  BP: 105/68 (!) 111/54  Pulse: 80 69  Resp: 18 20  Temp:    SpO2: 96% 91%    Last Pain:  Vitals:   07/10/19 1615  TempSrc:   PainSc: 0-No pain                 Audry Pili

## 2019-07-10 NOTE — Anesthesia Procedure Notes (Signed)
Procedure Name: Intubation Date/Time: 07/10/2019 1:02 PM Performed by: Janene Harvey, CRNA Pre-anesthesia Checklist: Patient identified, Emergency Drugs available, Suction available and Patient being monitored Patient Re-evaluated:Patient Re-evaluated prior to induction Oxygen Delivery Method: Circle system utilized Preoxygenation: Pre-oxygenation with 100% oxygen Induction Type: IV induction Ventilation: Mask ventilation without difficulty Laryngoscope Size: Mac and 4 Grade View: Grade I Tube type: Oral Tube size: 7.5 mm Number of attempts: 1 Airway Equipment and Method: Stylet and Oral airway Placement Confirmation: ETT inserted through vocal cords under direct vision,  positive ETCO2 and breath sounds checked- equal and bilateral Secured at: 22 cm Tube secured with: Tape Dental Injury: Teeth and Oropharynx as per pre-operative assessment

## 2019-07-10 NOTE — Transfer of Care (Signed)
Immediate Anesthesia Transfer of Care Note  Patient: Nicholas [name].  Procedure(s) Performed: Pars Plana Vitrectomy With 25g Removal/Suture Intraocular Lens (Left Eye)  Patient Location: PACU  Anesthesia Type:General  Level of Consciousness: drowsy  Airway & Oxygen Therapy: Patient Spontanous Breathing and Patient connected to nasal cannula oxygen  Post-op Assessment: Report given to RN and Post -op Vital signs reviewed and stable  Post vital signs: Reviewed  Last Vitals:  Vitals Value Taken Time  BP 113/50 07/10/19 1548  Temp    Pulse 76 07/10/19 1550  Resp 19 07/10/19 1550  SpO2 96 % 07/10/19 1550  Vitals shown include unvalidated device data.  Last Pain:  Vitals:   07/10/19 1051  TempSrc:   PainSc: 0-No pain      Patients Stated Pain Goal: 3 (90/21/11 5520)  Complications: No apparent anesthesia complications

## 2019-07-10 NOTE — Brief Op Note (Signed)
07/10/2019  4:05 PM  PATIENT:  Nicholas T Sperbeck Jr.  86 y.o. male  PRE-OPERATIVE DIAGNOSIS:  APHAKIA LEFT EYE, OTHER VITREOUS OPACITIES LEFT EYE, DISLOCATED INTRAOCULAR LENS   POST-OPERATIVE DIAGNOSIS:  APHAKIA LEFT EYE, OTHER VITREOUS OPACITIES LEFT EYE, DISLOCATED INTRAOCULAR LENS   PROCEDURE:  Procedure(s): Pars Plana Vitrectomy With 25g, Removal of intraocular lens and placement of sulcus fixated sutured Intraocular Lens (Left)  SURGEON:  Surgeon(s) and Role:    * Jalene Mullet, MD - Primary  PHYSICIAN ASSISTANT:   ASSISTANTS: none   ANESTHESIA:   local and general  EBL:  3 mL   BLOOD ADMINISTERED:none  DRAINS: none   LOCAL MEDICATIONS USED:  MARCAINE    and LIDOCAINE   SPECIMEN:  No Specimen  DISPOSITION OF SPECIMEN:  N/A  COUNTS:  YES  TOURNIQUET:  * No tourniquets in log *  DICTATION: .Note written in EPIC  PLAN OF CARE: Discharge to home after PACU  PATIENT DISPOSITION:  PACU - hemodynamically stable.   Delay start of Pharmacological VTE agent (>24hrs) due to surgical blood loss or risk of bleeding: not applicable

## 2019-07-11 ENCOUNTER — Encounter: Payer: Self-pay | Admitting: *Deleted

## 2019-07-17 ENCOUNTER — Other Ambulatory Visit: Payer: Self-pay | Admitting: Medical

## 2019-07-24 ENCOUNTER — Encounter: Payer: Self-pay | Admitting: Adult Health

## 2019-07-24 ENCOUNTER — Other Ambulatory Visit: Payer: Self-pay

## 2019-07-24 ENCOUNTER — Inpatient Hospital Stay (HOSPITAL_BASED_OUTPATIENT_CLINIC_OR_DEPARTMENT_OTHER): Payer: Medicare Other | Admitting: Adult Health

## 2019-07-24 ENCOUNTER — Inpatient Hospital Stay: Payer: Medicare Other | Attending: Oncology

## 2019-07-24 ENCOUNTER — Ambulatory Visit: Payer: Medicare Other | Admitting: Adult Health

## 2019-07-24 ENCOUNTER — Other Ambulatory Visit: Payer: Medicare Other

## 2019-07-24 VITALS — BP 129/55 | HR 73 | Temp 98.7°F | Resp 18 | Ht 74.0 in | Wt 169.6 lb

## 2019-07-24 DIAGNOSIS — D473 Essential (hemorrhagic) thrombocythemia: Secondary | ICD-10-CM

## 2019-07-24 DIAGNOSIS — N1831 Chronic kidney disease, stage 3a: Secondary | ICD-10-CM | POA: Diagnosis not present

## 2019-07-24 DIAGNOSIS — N183 Chronic kidney disease, stage 3 unspecified: Secondary | ICD-10-CM

## 2019-07-24 DIAGNOSIS — N189 Chronic kidney disease, unspecified: Secondary | ICD-10-CM | POA: Diagnosis not present

## 2019-07-24 DIAGNOSIS — D631 Anemia in chronic kidney disease: Secondary | ICD-10-CM

## 2019-07-24 DIAGNOSIS — R161 Splenomegaly, not elsewhere classified: Secondary | ICD-10-CM

## 2019-07-24 LAB — CMP (CANCER CENTER ONLY)
ALT: 16 U/L (ref 0–44)
AST: 20 U/L (ref 15–41)
Albumin: 3.4 g/dL — ABNORMAL LOW (ref 3.5–5.0)
Alkaline Phosphatase: 86 U/L (ref 38–126)
Anion gap: 7 (ref 5–15)
BUN: 29 mg/dL — ABNORMAL HIGH (ref 8–23)
CO2: 25 mmol/L (ref 22–32)
Calcium: 8.5 mg/dL — ABNORMAL LOW (ref 8.9–10.3)
Chloride: 111 mmol/L (ref 98–111)
Creatinine: 1.45 mg/dL — ABNORMAL HIGH (ref 0.61–1.24)
GFR, Est AFR Am: 49 mL/min — ABNORMAL LOW (ref >60)
GFR, Estimated: 42 mL/min — ABNORMAL LOW (ref >60)
Glucose, Bld: 103 mg/dL — ABNORMAL HIGH (ref 70–99)
Potassium: 4.5 mmol/L (ref 3.5–5.1)
Sodium: 143 mmol/L (ref 135–145)
Total Bilirubin: 0.4 mg/dL (ref 0.3–1.2)
Total Protein: 6.9 g/dL (ref 6.5–8.1)

## 2019-07-24 LAB — CBC WITH DIFFERENTIAL/PLATELET
Abs Immature Granulocytes: 1.14 10*3/uL — ABNORMAL HIGH (ref 0.00–0.07)
Basophils Absolute: 0.2 10*3/uL — ABNORMAL HIGH (ref 0.0–0.1)
Basophils Relative: 2 %
Eosinophils Absolute: 0 10*3/uL (ref 0.0–0.5)
Eosinophils Relative: 0 %
HCT: 29 % — ABNORMAL LOW (ref 39.0–52.0)
Hemoglobin: 9.2 g/dL — ABNORMAL LOW (ref 13.0–17.0)
Immature Granulocytes: 8 %
Lymphocytes Relative: 15 %
Lymphs Abs: 2.3 10*3/uL (ref 0.7–4.0)
MCH: 35.2 pg — ABNORMAL HIGH (ref 26.0–34.0)
MCHC: 31.7 g/dL (ref 30.0–36.0)
MCV: 111.1 fL — ABNORMAL HIGH (ref 80.0–100.0)
Monocytes Absolute: 2 10*3/uL — ABNORMAL HIGH (ref 0.1–1.0)
Monocytes Relative: 14 %
Neutro Abs: 9.1 10*3/uL — ABNORMAL HIGH (ref 1.7–7.7)
Neutrophils Relative %: 61 %
Platelets: 305 10*3/uL (ref 150–400)
RBC: 2.61 MIL/uL — ABNORMAL LOW (ref 4.22–5.81)
RDW: 17.9 % — ABNORMAL HIGH (ref 11.5–15.5)
WBC: 14.8 10*3/uL — ABNORMAL HIGH (ref 4.0–10.5)
nRBC: 0.4 % — ABNORMAL HIGH (ref 0.0–0.2)

## 2019-07-24 MED ORDER — DARBEPOETIN ALFA 300 MCG/0.6ML IJ SOSY
PREFILLED_SYRINGE | INTRAMUSCULAR | Status: AC
  Filled 2019-07-24: qty 0.6

## 2019-07-24 MED ORDER — DARBEPOETIN ALFA 300 MCG/0.6ML IJ SOSY
300.0000 ug | PREFILLED_SYRINGE | Freq: Once | INTRAMUSCULAR | Status: AC
  Administered 2019-07-24: 15:00:00 300 ug via SUBCUTANEOUS

## 2019-07-24 NOTE — Progress Notes (Signed)
[name]  Telephone:(336) [phone] Fax:([phone]   ID: Nicholas [name].   DOB: 1927/10/20  MR#: [id number]  RKY#:[id number]  Patient Care Team: Denita Lung, MD as PCP - General (Family Medicine) Magrinat, Virgie Dad, MD as Consulting Physician (Oncology) Tysinger, Camelia Eng, PA-C as Physician Assistant (Family Medicine)  OTHERS: Dr. Posey Pronto (eye doctor)   CHIEF COMPLAINT:  Essential thrombocytosis and chronic anemia  CURRENT TREATMENT: hydroxyurea; Aranesp   INTERVAL HISTORY: Nicholas [name] returns today for follow-up and treatment of his myeloproliferative disorder.  He is accompanied by his daughter (who incidentally works in primary care, and Dr. Lanice Shirts office) [name]  He continues on hydroxyurea, 537m daily.  He tolerates that well.  He is also receiving Aranesp, every 6 weeks, last received on 06/11/2020.  He tolerates this well.  He is due for another injection today.     REVIEW OF SYSTEMS: Nicholas [name] has some headaches that he attributes to his vision and macular degeneration.  He denies any other issues such as fever, chills, chest pain, palpitations, cough, shortness of breath, nausea, vomiting, bowel/bladder changes or any other concerns.  A detailed ROS was otherwise non contributory.     PAST MEDICAL HISTORY: Past Medical History:  Diagnosis Date  . Abnormal CT scan, sinus 10/04/06   pansinusitis, mildly deviated septum  . Allergy   . Anemia    related to CKD  . Cancer ([place]    melanoma skin cancer  . Chronic sinusitis   . Dysthymia    dysthymia  . H/O echocardiogram 01/04/2007   LV function normal, 55-60% EF, mildly increased thickenss of LV wall  . H/O echocardiogram 12/2006   mild LV wall thickening, EF 55-60%, LV function normal  . Headache     " sinus"  . History of MRI of brain and brain stem 02/26/06   sinusitis, nonspecific deep white matter changes normal for age  . Hypertension   . Macular degeneration    B/L  . Seborrheic keratosis      sees HToni Arthurs NP dermatology  . Thrombocytosis ([place]    sees hematology    PAST SURGICAL HISTORY: Past Surgical History:  Procedure Laterality Date  . EYE SURGERY    . HERNIA REPAIR     right inguinal repair  . I & D EXTREMITY Right 09/16/2017   Procedure: IRRIGATION AND DEBRIDEMENT EXTREMITY;  Surgeon: GRoseanne Kaufman MD;  [facility]  Service: Orthopedics;  Laterality: Right;  . PARS PLANA VITRECTOMY Left 07/10/2019   Procedure: Pars Plana Vitrectomy With 25g Removal/Suture Intraocular Lens;  Surgeon: PJalene Mullet MD;  [facility]  Service: Ophthalmology;  Laterality: Left;  . TONSILLECTOMY     childhood  . VARICOSE VEIN SURGERY     right lower leg    FAMILY HISTORY Family History  Problem Relation Age of Onset  . Heart disease Father   . Stroke Father   . Cancer Neg Hx   . COPD Neg Hx   . Diabetes Neg Hx     SOCIAL HISTORY:  (Updated December 2020) The patient lives with his wife, who suffers from diabetes and emphysema and has had some small strokes. She has progressive dementia.  He gives her her insulin shots. He has 2 daughters, SBarbera Setterswho lives a mile away and is pretty much retired and CInternational aid/development workerwho [place] works with [employer].The patient has 3 grandchildren..Nicholas [name]  ADVANCED DIRECTIVES: the patient tells me his daughter Nicholas Pulse  [name] would make health care decisions if he is incapacitated.  At the 06/12/2019 visit the patient was given the appropriate documents to complete and notarized at his discretion.    HEALTH MAINTENANCE:  Social History   Tobacco Use  . Smoking status: Former Smoker    Quit date: 06/14/1958    Years since quitting: 61.1  . Smokeless tobacco: Never Used  Substance Use Topics  . Alcohol use: No  . Drug use: No     Colonoscopy:  Not on file  PSA: Not on file  Bone density: Never  Lipid panel: October 2014  Allergies  Allergen Reactions  . Erythromycin Other (See Comments)    Gums turned  red and bled profusely  . Tape Other (See Comments)    The patient's SKIN IS VERY THIN AND TEARS AND BRUISES VERY EASILY!!    Current Outpatient Medications  Medication Sig Dispense Refill  . acetaminophen (TYLENOL) 650 MG CR tablet Take 650 mg by mouth at bedtime.    Nicholas [name] aspirin 81 MG tablet Take 1 tablet (81 mg total) by mouth daily. 90 tablet 3  . Calcium Carbonate-Vitamin D3 600-400 MG-UNIT TABS Take 1 tablet by mouth 2 (two) times daily. 60 tablet 2  . cetirizine (ZYRTEC) 10 MG tablet Take 10 mg by mouth at bedtime.    . [facility] D3 25 MCG (1000 UT) capsule TAKE 1 CAPSULE BY MOUTH EVERY DAY (Patient taking differently: Take 1,000 Units by mouth daily. ) 120 capsule 3  . FLUoxetine (PROZAC) 20 MG capsule TAKE 1 CAPSULE BY MOUTH EVERY DAY (Patient taking differently: Take 20 mg by mouth daily. ) 90 capsule 1  . hydroxyurea (HYDREA) 500 MG capsule Take 1 capsule (500 mg total) by mouth daily. May take with food to minimize GI side effects. 90 capsule 3  . saccharomyces boulardii (FLORASTOR) 250 MG capsule Take 1 capsule (250 mg total) by mouth 2 (two) times daily.    . sodium chloride (OCEAN) 0.65 % nasal spray Place 1 spray into the nose 4 (four) times daily as needed for congestion.     Nicholas [name] terazosin (HYTRIN) 2 MG capsule TAKE 1 CAPSULE (2 MG TOTAL) BY MOUTH AT BEDTIME. 90 capsule 3   Current Facility-Administered Medications  Medication Dose Route Frequency Provider Last Rate Last Admin  . Darbepoetin Alfa (ARANESP) injection 300 mcg  300 mcg Subcutaneous Once Magrinat, Virgie Dad, MD        OBJECTIVE:  Vitals:   07/24/19 1343  BP: (!) 129/55  Pulse: 73  Resp: 18  Temp: 98.7 F (37.1 C)  SpO2: 97%    Body mass index is 21.78 kg/m.  Filed Weights   07/24/19 1343  Weight: 169 lb 9.6 oz (76.9 kg)    ECOG FS:2 - Symptomatic, <50% confined to bed GENERAL: Patient is a well appearing older man examined in wheelchair.   HEENT:  Sclerae anicteric. Mask in place. Neck is supple.  NODES:   No cervical, supraclavicular, or axillary lymphadenopathy palpated.  LUNGS:  Clear to auscultation bilaterally.  No wheezes or rhonchi. HEART:  Regular rate and rhythm. No murmur appreciated. ABDOMEN:  Soft, nontender.  Positive, normoactive bowel sounds. No organomegaly palpated. MSK:  No focal spinal tenderness to palpation.  EXTREMITIES:  No peripheral edema.   SKIN:  Clear with no obvious rashes or skin changes. No nail dyscrasia. NEURO:  Nonfocal. Well oriented.  Appropriate affect.      LAB RESULTS:  CBC    Component Value Date/Time  WBC 14.8 (H) 07/24/2019 1310   RBC 2.61 (L) 07/24/2019 1310   HGB 9.2 (L) 07/24/2019 1310   HGB 10.0 (L) 03/20/2019 1355   HGB 11.4 (L) 02/23/2019 1529   HGB 8.7 (L) 06/03/2017 1404   HCT 29.0 (L) 07/24/2019 1310   HCT 32.8 (L) 02/23/2019 1529   HCT 27.6 (L) 06/03/2017 1404   PLT 305 07/24/2019 1310   PLT 350 03/20/2019 1355   PLT 346 02/23/2019 1529   MCV 111.1 (H) 07/24/2019 1310   MCV 102 (H) 02/23/2019 1529   MCV 103.8 (H) 06/03/2017 1404   MCH 35.2 (H) 07/24/2019 1310   MCHC 31.7 07/24/2019 1310   RDW 17.9 (H) 07/24/2019 1310   RDW 16.8 (H) 02/23/2019 1529   RDW 20.5 (H) 06/03/2017 1404   LYMPHSABS 2.3 07/24/2019 1310   LYMPHSABS 2.7 02/23/2019 1529   LYMPHSABS 2.7 06/03/2017 1404   MONOABS 2.0 (H) 07/24/2019 1310   MONOABS 2.2 (H) 06/03/2017 1404   EOSABS 0.0 07/24/2019 1310   EOSABS 0.0 02/23/2019 1529   BASOSABS 0.2 (H) 07/24/2019 1310   BASOSABS 0.4 (H) 02/23/2019 1529   BASOSABS 0.5 (H) 06/03/2017 1404      Chemistry      Component Value Date/Time   NA 143 07/24/2019 1310   NA 140 02/23/2019 1529   NA 142 06/03/2017 1404   K 4.5 07/24/2019 1310   K 3.9 06/03/2017 1404   CL 111 07/24/2019 1310   CO2 25 07/24/2019 1310   CO2 26 06/03/2017 1404   BUN 29 (H) 07/24/2019 1310   BUN 26 02/23/2019 1529   BUN 19.9 06/03/2017 1404   CREATININE 1.45 (H) 07/24/2019 1310   CREATININE 1.6 (H) 06/03/2017 1404        Component Value Date/Time   CALCIUM 8.5 (L) 07/24/2019 1310   CALCIUM 8.9 06/03/2017 1404   ALKPHOS 86 07/24/2019 1310   ALKPHOS 91 06/03/2017 1404   AST 20 07/24/2019 1310   AST 20 06/03/2017 1404   ALT 16 07/24/2019 1310   ALT 12 06/03/2017 1404   BILITOT 0.4 07/24/2019 1310   BILITOT 0.48 06/03/2017 1404       STUDIES: No results found.    ASSESSMENT: 86 y.o.  [place] man with   (1) essential thrombocytosis initially diagnosed December 2003,  controlled on anagrelide 0.5 mg daily and aspirin 81 mg daily initially, stopped 06/2017  (a) no evidence of leukemic transformation  (b) Hydrea 585m daily starting in 07/2017  (2) anemia, secondary to chronic kidney disease, receiving aranesp 3024m every 6 weeks  (3) possible transition to myelofibrosis on bone marrow biopsy 06/22/2017,  (a) normal cytogenetics  (b) FISH WNL   PLAN: Edem is doing well today.  He continues on Hydrea daily and he tolerates that well.  His WBC and plt count remain stable today.  His hemoglobin is 9.5.  He will receive the aranesp today.  He continues to tolerate this well.    Maston will return in 6 weeks for labs, f/u, and his next injection.  She was recommended to continue with the appropriate pandemic precautions. She knows to call for any questions that may arise between now and her next appointment.  We are happy to see her sooner if needed.  Total encounter time: 20 minutes   LiWilber BihariNP 07/24/19 2:35 PM Medical Oncology and Hemat[facility] [id number]. [phone]  Fax. [phone] *Total Encounter Time as defined  by the Centers for Medicare and Medicaid Services includes, in addition to the face-to-face time of a patient visit (documented in the note above) non-face-to-face time: obtaining and reviewing outside history, ordering and reviewing medications, tests or procedures, care coordination (communications with  other health care professionals or caregivers) and documentation in the medical record.

## 2019-07-24 NOTE — Patient Instructions (Signed)
Darbepoetin Alfa injection What is this medicine? DARBEPOETIN ALFA (dar be POE e tin AL fa) helps your body make more red blood cells. It is used to treat anemia caused by chronic kidney failure and chemotherapy. This medicine may be used for other purposes; ask your health care provider or pharmacist if you have questions. COMMON BRAND NAME(S): Aranesp What should I tell my health care provider before I take this medicine? They need to know if you have any of these conditions:  blood clotting disorders or history of blood clots  cancer patient not on chemotherapy  cystic fibrosis  heart disease, such as angina, heart failure, or a history of a heart attack  hemoglobin level of 12 g/dL or greater  high blood pressure  low levels of folate, iron, or vitamin B12  seizures  an unusual or allergic reaction to darbepoetin, erythropoietin, albumin, hamster proteins, latex, other medicines, foods, dyes, or preservatives  pregnant or trying to get pregnant  breast-feeding How should I use this medicine? This medicine is for injection into a vein or under the skin. It is usually given by a health care professional in a hospital or clinic setting. If you get this medicine at home, you will be taught how to prepare and give this medicine. Use exactly as directed. Take your medicine at regular intervals. Do not take your medicine more often than directed. It is important that you put your used needles and syringes in a special sharps container. Do not put them in a trash can. If you do not have a sharps container, call your pharmacist or healthcare provider to get one. A special MedGuide will be given to you by the pharmacist with each prescription and refill. Be sure to read this information carefully each time. Talk to your pediatrician regarding the use of this medicine in children. While this medicine may be used in children as young as 1 month of age for selected conditions, precautions do  apply. Overdosage: If you think you have taken too much of this medicine contact a poison control center or emergency room at once. NOTE: This medicine is only for you. Do not share this medicine with others. What if I miss a dose? If you miss a dose, take it as soon as you can. If it is almost time for your next dose, take only that dose. Do not take double or extra doses. What may interact with this medicine? Do not take this medicine with any of the following medications:  epoetin alfa This list may not describe all possible interactions. Give your health care provider a list of all the medicines, herbs, non-prescription drugs, or dietary supplements you use. Also tell them if you smoke, drink alcohol, or use illegal drugs. Some items may interact with your medicine. What should I watch for while using this medicine? Your condition will be monitored carefully while you are receiving this medicine. You may need blood work done while you are taking this medicine. This medicine may cause a decrease in vitamin B6. You should make sure that you get enough vitamin B6 while you are taking this medicine. Discuss the foods you eat and the vitamins you take with your health care professional. What side effects may I notice from receiving this medicine? Side effects that you should report to your doctor or health care professional as soon as possible:  allergic reactions like skin rash, itching or hives, swelling of the face, lips, or tongue  breathing problems  changes in   vision  chest pain  confusion, trouble speaking or understanding  feeling faint or lightheaded, falls  high blood pressure  muscle aches or pains  pain, swelling, warmth in the leg  rapid weight gain  severe headaches  sudden numbness or weakness of the face, arm or leg  trouble walking, dizziness, loss of balance or coordination  seizures (convulsions)  swelling of the ankles, feet, hands  unusually weak or  tired Side effects that usually do not require medical attention (report to your doctor or health care professional if they continue or are bothersome):  diarrhea  fever, chills (flu-like symptoms)  headaches  nausea, vomiting  redness, stinging, or swelling at site where injected This list may not describe all possible side effects. Call your doctor for medical advice about side effects. You may report side effects to FDA at 1-800-FDA-1088. Where should I keep my medicine? Keep out of the reach of children. Store in a refrigerator between 2 and 8 degrees C (36 and 46 degrees F). Do not freeze. Do not shake. Throw away any unused portion if using a single-dose vial. Throw away any unused medicine after the expiration date. NOTE: This sheet is a summary. It may not cover all possible information. If you have questions about this medicine, talk to your doctor, pharmacist, or health care provider.  2020 Elsevier/Gold Standard (2017-06-15 16:44:20)  

## 2019-07-25 ENCOUNTER — Telehealth: Payer: Self-pay | Admitting: Adult Health

## 2019-07-25 NOTE — Telephone Encounter (Signed)
I left a message regarding schedule  

## 2019-07-26 ENCOUNTER — Ambulatory Visit: Payer: Medicare Other | Admitting: Adult Health

## 2019-07-26 ENCOUNTER — Other Ambulatory Visit: Payer: Medicare Other

## 2019-08-06 DIAGNOSIS — H353211 Exudative age-related macular degeneration, right eye, with active choroidal neovascularization: Secondary | ICD-10-CM | POA: Diagnosis not present

## 2019-08-18 ENCOUNTER — Other Ambulatory Visit: Payer: Self-pay | Admitting: Nurse Practitioner

## 2019-08-29 ENCOUNTER — Other Ambulatory Visit: Payer: Self-pay | Admitting: Family Medicine

## 2019-08-29 NOTE — Telephone Encounter (Signed)
[facility] is requesting to [facility] pt [facility]. Please advise [facility]

## 2019-09-03 NOTE — Progress Notes (Signed)
[facility]  Telephone:(336) [phone] Fax:(336) [phone]   ID: Nicholas [name].   DOB: 07/26/1927  MR#: [id number]  PZW#:[id number]  Patient Care Team: Denita Lung, MD as PCP - General (Family Medicine) Sylis Ketchum, Virgie Dad, MD as Consulting Physician (Oncology) Tysinger, Camelia Eng, PA-C as Physician Assistant (Family Medicine)  OTHERS: Dr. Posey Pronto (eye doctor)   CHIEF COMPLAINT:  Essential thrombocytosis and chronic anemia  CURRENT TREATMENT: hydroxyurea; Aranesp   INTERVAL HISTORY: Kendarius returns today for follow-up and treatment of his myeloproliferative disorder.  He is accompanied by his daughter (who incidentally works in primary care, and Dr. Lanice Shirts office) [name]  He continues on hydroxyurea, 575m daily.  He tolerates that well.  He is obtaining it at a good price.  He is also receiving Aranesp, last received on 07/24/2019.  He tolerates this well.  He is due for another injection today.    Since his last visit here he underwent a pars plana vitrectomy with removal of the posterior implant and placement of a sulcus fixated suture intraocular lens in the left eye   REVIEW OF SYSTEMS: Smokey tells me that his vision in the left eye is a bit better but he is continuing to lose vision in the right eye.  He is using a magnifying glass to read.  He tells me he can get around his own house fairly well but he cannot drive.  He denies falls.  He does some reading of food but one of his daughters lives nearby but he much cooks all the food that he and his wife eat at home.   PAST MEDICAL HISTORY: Past Medical History:  Diagnosis Date  . Abnormal CT scan, sinus 10/04/06   pansinusitis, mildly deviated septum  . Allergy   . Anemia    related to CKD  . Cancer ([place]    melanoma skin cancer  . Chronic sinusitis   . Dysthymia    dysthymia  . H/O echocardiogram 01/04/2007   LV function normal, 55-60% EF, mildly increased thickenss of LV wall  . H/O echocardiogram  12/2006   mild LV wall thickening, EF 55-60%, LV function normal  . Headache     " sinus"  . History of MRI of brain and brain stem 02/26/06   sinusitis, nonspecific deep white matter changes normal for age  . Hypertension   . Macular degeneration    B/L  . Seborrheic keratosis    sees HToni Arthurs NP dermatology  . Thrombocytosis ([place]    sees hematology    PAST SURGICAL HISTORY: Past Surgical History:  Procedure Laterality Date  . EYE SURGERY    . HERNIA REPAIR     right inguinal repair  . I & D EXTREMITY Right 09/16/2017   Procedure: IRRIGATION AND DEBRIDEMENT EXTREMITY;  Surgeon: GRoseanne Kaufman MD;  [facility]  Service: Orthopedics;  Laterality: Right;  . PARS PLANA VITRECTOMY Left 07/10/2019   Procedure: Pars Plana Vitrectomy With 25g Removal/Suture Intraocular Lens;  Surgeon: PJalene Mullet MD;  [facility]  Service: Ophthalmology;  Laterality: Left;  . TONSILLECTOMY     childhood  . VARICOSE VEIN SURGERY     right lower leg    FAMILY HISTORY Family History  Problem Relation Age of Onset  . Heart disease Father   . Stroke Father   . Cancer Neg Hx   . COPD Neg Hx   . Diabetes Neg Hx     SOCIAL HISTORY:  (Updated December 2020) The patient  lives with his wife, who suffers from diabetes and emphysema and has had some small strokes. She has progressive dementia.  He gives her her insulin shots. He has 2 daughters, [name] who lives a mile away and is pretty much retired and International aid/development worker who lives near the [employer] and works with Dr [employer] .The patient has 3 grandchildren.[name]   ADVANCED DIRECTIVES: the patient tells me his daughter Justn Quale would make health care decisions if he is incapacitated.  At the 06/12/2019 visit the patient was given the appropriate documents to complete and notarized at his discretion.    HEALTH MAINTENANCE:  Social History   Tobacco Use  . Smoking status: Former Smoker    Quit date: 06/14/1958    Years since quitting:  61.2  . Smokeless tobacco: Never Used  Substance Use Topics  . Alcohol use: No  . Drug use: No     Colonoscopy:  Not on file  PSA: Not on file  Bone density: Never  Lipid panel: October 2014  Allergies  Allergen Reactions  . Erythromycin Other (See Comments)    Gums turned red and bled profusely  . Tape Other (See Comments)    The patient's SKIN IS VERY THIN AND TEARS AND BRUISES VERY EASILY!!    Current Outpatient Medications  Medication Sig Dispense Refill  . acetaminophen (TYLENOL) 650 MG CR tablet Take 650 mg by mouth at bedtime.    [name] aspirin 81 MG tablet Take 1 tablet (81 mg total) by mouth daily. 90 tablet 3  . Calcium Carbonate-Vitamin D3 600-400 MG-UNIT TABS Take 1 tablet by mouth 2 (two) times daily. 60 tablet 2  . cetirizine (ZYRTEC) 10 MG tablet Take 10 mg by mouth at bedtime.    . [facility] D3 25 MCG (1000 UT) capsule TAKE 1 CAPSULE BY MOUTH EVERY DAY (Patient taking differently: Take 1,000 Units by mouth daily. ) 120 capsule 3  . FLUoxetine (PROZAC) 20 MG capsule TAKE 1 CAPSULE BY MOUTH EVERY DAY 90 capsule 1  . hydroxyurea (HYDREA) 500 MG capsule Take 1 capsule (500 mg total) by mouth daily. May take with food to minimize GI side effects. 90 capsule 3  . saccharomyces boulardii (FLORASTOR) 250 MG capsule Take 1 capsule (250 mg total) by mouth 2 (two) times daily.    . sodium chloride (OCEAN) 0.65 % nasal spray Place 1 spray into the nose 4 (four) times daily as needed for congestion.     [name] terazosin (HYTRIN) 2 MG capsule TAKE 1 CAPSULE (2 MG TOTAL) BY MOUTH AT BEDTIME. 90 capsule 3   Current Facility-Administered Medications  Medication Dose Route Frequency Provider Last Rate Last Admin  . Darbepoetin Alfa (ARANESP) injection 500 mcg  500 mcg Subcutaneous Once Arbell Wycoff, Virgie Dad, MD        OBJECTIVE: Elderly white male examined in a wheelchair Vitals:   09/04/19 1415  BP: (!) 138/56  Pulse: 72  Resp: 18  Temp: 98.2 F (36.8 C)  SpO2: 98%    Body mass index is  21.42 kg/m.  Filed Weights   09/04/19 1415  Weight: 166 lb 12.8 oz (75.7 kg)    ECOG FS:2 - Symptomatic, <50% confined to bed  Sclerae unicteric, slight inflammation left eyelids Wearing a mask No cervical or supraclavicular adenopathy Lungs no rales or rhonchi Heart regular rate and rhythm Abd soft, nontender, positive bowel sounds MSK no focal spinal tenderness Neuro: nonfocal, well oriented, positive affect   LAB RESULTS:  CBC    Component  Value Date/Time   WBC 13.4 (H) 09/04/2019 1356   RBC 2.72 (L) 09/04/2019 1356   HGB 9.9 (L) 09/04/2019 1356   HGB 10.0 (L) 03/20/2019 1355   HGB 11.4 (L) 02/23/2019 1529   HGB 8.7 (L) 06/03/2017 1404   HCT 31.0 (L) 09/04/2019 1356   HCT 32.8 (L) 02/23/2019 1529   HCT 27.6 (L) 06/03/2017 1404   PLT 318 09/04/2019 1356   PLT 350 03/20/2019 1355   PLT 346 02/23/2019 1529   MCV 114.0 (H) 09/04/2019 1356   MCV 102 (H) 02/23/2019 1529   MCV 103.8 (H) 06/03/2017 1404   MCH 36.4 (H) 09/04/2019 1356   MCHC 31.9 09/04/2019 1356   RDW 18.0 (H) 09/04/2019 1356   RDW 16.8 (H) 02/23/2019 1529   RDW 20.5 (H) 06/03/2017 1404   LYMPHSABS PENDING 09/04/2019 1356   LYMPHSABS 2.7 02/23/2019 1529   LYMPHSABS 2.7 06/03/2017 1404   MONOABS PENDING 09/04/2019 1356   MONOABS 2.2 (H) 06/03/2017 1404   EOSABS PENDING 09/04/2019 1356   EOSABS 0.0 02/23/2019 1529   BASOSABS PENDING 09/04/2019 1356   BASOSABS 0.4 (H) 02/23/2019 1529   BASOSABS 0.5 (H) 06/03/2017 1404      Chemistry      Component Value Date/Time   NA 143 07/24/2019 1310   NA 140 02/23/2019 1529   NA 142 06/03/2017 1404   K 4.5 07/24/2019 1310   K 3.9 06/03/2017 1404   CL 111 07/24/2019 1310   CO2 25 07/24/2019 1310   CO2 26 06/03/2017 1404   BUN 29 (H) 07/24/2019 1310   BUN 26 02/23/2019 1529   BUN 19.9 06/03/2017 1404   CREATININE 1.45 (H) 07/24/2019 1310   CREATININE 1.6 (H) 06/03/2017 1404      Component Value Date/Time   CALCIUM 8.5 (L) 07/24/2019 1310   CALCIUM  8.9 06/03/2017 1404   ALKPHOS 86 07/24/2019 1310   ALKPHOS 91 06/03/2017 1404   AST 20 07/24/2019 1310   AST 20 06/03/2017 1404   ALT 16 07/24/2019 1310   ALT 12 06/03/2017 1404   BILITOT 0.4 07/24/2019 1310   BILITOT 0.48 06/03/2017 1404       STUDIES: No results found.    ASSESSMENT: 86 y.o.  [place] man with   (1) essential thrombocytosis initially diagnosed December 2003,  controlled on anagrelide 0.5 mg daily and aspirin 81 mg daily initially, stopped 06/2017  (a) no evidence of leukemic transformation  (b) Hydrea 535m daily starting in 07/2017  (2) anemia, secondary to chronic kidney disease, receiving aranesp   (a) dose increased to 500 mg every 4 weeks starting 09/04/2019  (3) concern regarding possible transition to myelofibrosis on bone marrow biopsy 06/22/2017,  (a) normal cytogenetics  (b) FISH WNL   PLAN: Trayven is tolerating the Hydrea well and that is controlling his white cells and platelets.  As far as I can tell he is able to obtain the drug at a very reasonable price since he does not recall any of his drugs being expensive.  The plan is to continue this indefinitely.  We are treating his anemia of renal disease with Aranesp.  The dose has not been quite sufficient to keep his hemoglobin reliably above 10 which is the goal.  I am upping the dose to 500 today and decreasing the dosing intervals to every 4 weeks instead of 6.  Again the goal is hemoglobin between 10 and 11  Because of a bad experience he had with inappropriate phlebotomy in the treatment  area he refuses to go back there.  We can accommodate him by treating him in the family room and the breast center area and having my nurse gave him the Aranesp.  We are doing that today for example.  Otherwise he will return to see me specifically in 6 weeks.  He knows to call for any other issue that may develop before then.  Total encounter time 25 minutes.Sarajane Jews C. Mattthew Ziomek, MD 09/04/19 2:42  PM Medical Oncology and Hematology [facility] [place], [place] [address] Tel. [phone]    Fax. [phone]   I, Wilburn Mylar, am acting as scribe for Dr. Virgie Dad. Rindi Beechy.  I, Lurline Del MD, have reviewed the above documentation for accuracy and completeness, and I agree with the above.    *Total Encounter Time as defined by the Centers for Medicare and Medicaid Services includes, in addition to the face-to-face time of a patient visit (documented in the note above) non-face-to-face time: obtaining and reviewing outside history, ordering and reviewing medications, tests or procedures, care coordination (communications with other health care professionals or caregivers) and documentation in the medical record.

## 2019-09-04 ENCOUNTER — Inpatient Hospital Stay (HOSPITAL_BASED_OUTPATIENT_CLINIC_OR_DEPARTMENT_OTHER): Payer: Medicare Other | Admitting: Oncology

## 2019-09-04 ENCOUNTER — Inpatient Hospital Stay: Payer: Medicare Other | Attending: Oncology

## 2019-09-04 ENCOUNTER — Other Ambulatory Visit: Payer: Self-pay

## 2019-09-04 VITALS — BP 138/56 | HR 72 | Temp 98.2°F | Resp 18 | Ht 74.0 in | Wt 166.8 lb

## 2019-09-04 DIAGNOSIS — D473 Essential (hemorrhagic) thrombocythemia: Secondary | ICD-10-CM

## 2019-09-04 DIAGNOSIS — D631 Anemia in chronic kidney disease: Secondary | ICD-10-CM | POA: Diagnosis not present

## 2019-09-04 DIAGNOSIS — Z7189 Other specified counseling: Secondary | ICD-10-CM | POA: Diagnosis not present

## 2019-09-04 DIAGNOSIS — R161 Splenomegaly, not elsewhere classified: Secondary | ICD-10-CM | POA: Diagnosis not present

## 2019-09-04 DIAGNOSIS — N189 Chronic kidney disease, unspecified: Secondary | ICD-10-CM | POA: Insufficient documentation

## 2019-09-04 DIAGNOSIS — N183 Chronic kidney disease, stage 3 unspecified: Secondary | ICD-10-CM

## 2019-09-04 LAB — CMP (CANCER CENTER ONLY)
ALT: 17 U/L (ref 0–44)
AST: 24 U/L (ref 15–41)
Albumin: 3.6 g/dL (ref 3.5–5.0)
Alkaline Phosphatase: 87 U/L (ref 38–126)
Anion gap: 9 (ref 5–15)
BUN: 30 mg/dL — ABNORMAL HIGH (ref 8–23)
CO2: 25 mmol/L (ref 22–32)
Calcium: 8.8 mg/dL — ABNORMAL LOW (ref 8.9–10.3)
Chloride: 109 mmol/L (ref 98–111)
Creatinine: 1.56 mg/dL — ABNORMAL HIGH (ref 0.61–1.24)
GFR, Est AFR Am: 45 mL/min — ABNORMAL LOW (ref >60)
GFR, Estimated: 39 mL/min — ABNORMAL LOW (ref >60)
Glucose, Bld: 117 mg/dL — ABNORMAL HIGH (ref 70–99)
Potassium: 4.4 mmol/L (ref 3.5–5.1)
Sodium: 143 mmol/L (ref 135–145)
Total Bilirubin: 0.5 mg/dL (ref 0.3–1.2)
Total Protein: 7 g/dL (ref 6.5–8.1)

## 2019-09-04 LAB — CBC WITH DIFFERENTIAL/PLATELET
Abs Immature Granulocytes: 0.98 10*3/uL — ABNORMAL HIGH (ref 0.00–0.07)
Basophils Absolute: 0.2 10*3/uL — ABNORMAL HIGH (ref 0.0–0.1)
Basophils Relative: 2 %
Eosinophils Absolute: 0 10*3/uL (ref 0.0–0.5)
Eosinophils Relative: 0 %
HCT: 31 % — ABNORMAL LOW (ref 39.0–52.0)
Hemoglobin: 9.9 g/dL — ABNORMAL LOW (ref 13.0–17.0)
Immature Granulocytes: 7 %
Lymphocytes Relative: 17 %
Lymphs Abs: 2.3 10*3/uL (ref 0.7–4.0)
MCH: 36.4 pg — ABNORMAL HIGH (ref 26.0–34.0)
MCHC: 31.9 g/dL (ref 30.0–36.0)
MCV: 114 fL — ABNORMAL HIGH (ref 80.0–100.0)
Monocytes Absolute: 1.8 10*3/uL — ABNORMAL HIGH (ref 0.1–1.0)
Monocytes Relative: 13 %
Neutro Abs: 8.2 10*3/uL — ABNORMAL HIGH (ref 1.7–7.7)
Neutrophils Relative %: 61 %
Platelets: 318 10*3/uL (ref 150–400)
RBC: 2.72 MIL/uL — ABNORMAL LOW (ref 4.22–5.81)
RDW: 18 % — ABNORMAL HIGH (ref 11.5–15.5)
WBC: 13.4 10*3/uL — ABNORMAL HIGH (ref 4.0–10.5)
nRBC: 0.4 % — ABNORMAL HIGH (ref 0.0–0.2)

## 2019-09-04 MED ORDER — DARBEPOETIN ALFA 500 MCG/ML IJ SOSY
PREFILLED_SYRINGE | INTRAMUSCULAR | Status: AC
  Filled 2019-09-04: qty 1

## 2019-09-04 MED ORDER — DARBEPOETIN ALFA 500 MCG/ML IJ SOSY
500.0000 ug | PREFILLED_SYRINGE | Freq: Once | INTRAMUSCULAR | Status: AC
  Administered 2019-09-04: 500 ug via SUBCUTANEOUS

## 2019-09-05 ENCOUNTER — Telehealth: Payer: Self-pay | Admitting: Oncology

## 2019-09-05 NOTE — Telephone Encounter (Signed)
Scheduled appt per 3/23 los - unable to reach pt daughter . Left message with next appt

## 2019-09-06 ENCOUNTER — Other Ambulatory Visit: Payer: Medicare Other

## 2019-09-06 ENCOUNTER — Ambulatory Visit: Payer: Medicare Other | Admitting: Oncology

## 2019-09-10 DIAGNOSIS — H353211 Exudative age-related macular degeneration, right eye, with active choroidal neovascularization: Secondary | ICD-10-CM | POA: Diagnosis not present

## 2019-10-02 ENCOUNTER — Inpatient Hospital Stay: Payer: Medicare Other

## 2019-10-02 ENCOUNTER — Other Ambulatory Visit: Payer: Self-pay

## 2019-10-02 ENCOUNTER — Inpatient Hospital Stay: Payer: Medicare Other | Attending: Oncology

## 2019-10-02 VITALS — BP 125/54 | HR 66 | Temp 99.6°F | Resp 20

## 2019-10-02 DIAGNOSIS — R161 Splenomegaly, not elsewhere classified: Secondary | ICD-10-CM

## 2019-10-02 DIAGNOSIS — N189 Chronic kidney disease, unspecified: Secondary | ICD-10-CM | POA: Insufficient documentation

## 2019-10-02 DIAGNOSIS — D473 Essential (hemorrhagic) thrombocythemia: Secondary | ICD-10-CM

## 2019-10-02 DIAGNOSIS — D631 Anemia in chronic kidney disease: Secondary | ICD-10-CM | POA: Diagnosis not present

## 2019-10-02 DIAGNOSIS — N183 Chronic kidney disease, stage 3 unspecified: Secondary | ICD-10-CM

## 2019-10-02 DIAGNOSIS — N1831 Chronic kidney disease, stage 3a: Secondary | ICD-10-CM

## 2019-10-02 LAB — CBC WITH DIFFERENTIAL/PLATELET
Abs Immature Granulocytes: 1.01 10*3/uL — ABNORMAL HIGH (ref 0.00–0.07)
Basophils Absolute: 0.3 10*3/uL — ABNORMAL HIGH (ref 0.0–0.1)
Basophils Relative: 2 %
Eosinophils Absolute: 0 10*3/uL (ref 0.0–0.5)
Eosinophils Relative: 0 %
HCT: 33 % — ABNORMAL LOW (ref 39.0–52.0)
Hemoglobin: 10.4 g/dL — ABNORMAL LOW (ref 13.0–17.0)
Immature Granulocytes: 7 %
Lymphocytes Relative: 16 %
Lymphs Abs: 2.2 10*3/uL (ref 0.7–4.0)
MCH: 35.4 pg — ABNORMAL HIGH (ref 26.0–34.0)
MCHC: 31.5 g/dL (ref 30.0–36.0)
MCV: 112.2 fL — ABNORMAL HIGH (ref 80.0–100.0)
Monocytes Absolute: 1.8 10*3/uL — ABNORMAL HIGH (ref 0.1–1.0)
Monocytes Relative: 13 %
Neutro Abs: 8.5 10*3/uL — ABNORMAL HIGH (ref 1.7–7.7)
Neutrophils Relative %: 62 %
Platelets: 361 10*3/uL (ref 150–400)
RBC: 2.94 MIL/uL — ABNORMAL LOW (ref 4.22–5.81)
RDW: 17.8 % — ABNORMAL HIGH (ref 11.5–15.5)
WBC: 13.7 10*3/uL — ABNORMAL HIGH (ref 4.0–10.5)
nRBC: 0.1 % (ref 0.0–0.2)

## 2019-10-02 LAB — CMP (CANCER CENTER ONLY)
ALT: 13 U/L (ref 0–44)
AST: 21 U/L (ref 15–41)
Albumin: 3.5 g/dL (ref 3.5–5.0)
Alkaline Phosphatase: 78 U/L (ref 38–126)
Anion gap: 8 (ref 5–15)
BUN: 30 mg/dL — ABNORMAL HIGH (ref 8–23)
CO2: 26 mmol/L (ref 22–32)
Calcium: 8.8 mg/dL — ABNORMAL LOW (ref 8.9–10.3)
Chloride: 108 mmol/L (ref 98–111)
Creatinine: 1.69 mg/dL — ABNORMAL HIGH (ref 0.61–1.24)
GFR, Est AFR Am: 41 mL/min — ABNORMAL LOW (ref >60)
GFR, Estimated: 35 mL/min — ABNORMAL LOW (ref >60)
Glucose, Bld: 100 mg/dL — ABNORMAL HIGH (ref 70–99)
Potassium: 4.6 mmol/L (ref 3.5–5.1)
Sodium: 142 mmol/L (ref 135–145)
Total Bilirubin: 0.5 mg/dL (ref 0.3–1.2)
Total Protein: 7.1 g/dL (ref 6.5–8.1)

## 2019-10-02 MED ORDER — DARBEPOETIN ALFA 500 MCG/ML IJ SOSY
500.0000 ug | PREFILLED_SYRINGE | Freq: Once | INTRAMUSCULAR | Status: AC
  Administered 2019-10-02: 500 ug via SUBCUTANEOUS

## 2019-10-02 MED ORDER — DARBEPOETIN ALFA 500 MCG/ML IJ SOSY
PREFILLED_SYRINGE | INTRAMUSCULAR | Status: AC
  Filled 2019-10-02: qty 1

## 2019-10-15 DIAGNOSIS — H353211 Exudative age-related macular degeneration, right eye, with active choroidal neovascularization: Secondary | ICD-10-CM | POA: Diagnosis not present

## 2019-10-16 ENCOUNTER — Ambulatory Visit: Payer: Medicare Other | Admitting: Adult Health

## 2019-10-16 ENCOUNTER — Other Ambulatory Visit: Payer: Medicare Other

## 2019-10-27 ENCOUNTER — Other Ambulatory Visit: Payer: Self-pay | Admitting: Oncology

## 2019-10-30 ENCOUNTER — Inpatient Hospital Stay: Payer: Medicare Other | Attending: Oncology

## 2019-10-30 ENCOUNTER — Other Ambulatory Visit: Payer: Self-pay

## 2019-10-30 ENCOUNTER — Inpatient Hospital Stay: Payer: Medicare Other | Admitting: Oncology

## 2019-10-30 DIAGNOSIS — D631 Anemia in chronic kidney disease: Secondary | ICD-10-CM | POA: Insufficient documentation

## 2019-10-30 DIAGNOSIS — N189 Chronic kidney disease, unspecified: Secondary | ICD-10-CM | POA: Diagnosis not present

## 2019-10-30 DIAGNOSIS — N183 Chronic kidney disease, stage 3 unspecified: Secondary | ICD-10-CM

## 2019-10-30 DIAGNOSIS — D473 Essential (hemorrhagic) thrombocythemia: Secondary | ICD-10-CM | POA: Diagnosis not present

## 2019-10-30 DIAGNOSIS — R161 Splenomegaly, not elsewhere classified: Secondary | ICD-10-CM

## 2019-10-30 LAB — CMP (CANCER CENTER ONLY)
ALT: 17 U/L (ref 0–44)
AST: 20 U/L (ref 15–41)
Albumin: 3.6 g/dL (ref 3.5–5.0)
Alkaline Phosphatase: 78 U/L (ref 38–126)
Anion gap: 9 (ref 5–15)
BUN: 31 mg/dL — ABNORMAL HIGH (ref 8–23)
CO2: 25 mmol/L (ref 22–32)
Calcium: 9 mg/dL (ref 8.9–10.3)
Chloride: 107 mmol/L (ref 98–111)
Creatinine: 1.64 mg/dL — ABNORMAL HIGH (ref 0.61–1.24)
GFR, Est AFR Am: 42 mL/min — ABNORMAL LOW (ref >60)
GFR, Estimated: 37 mL/min — ABNORMAL LOW (ref >60)
Glucose, Bld: 109 mg/dL — ABNORMAL HIGH (ref 70–99)
Potassium: 4.4 mmol/L (ref 3.5–5.1)
Sodium: 141 mmol/L (ref 135–145)
Total Bilirubin: 0.5 mg/dL (ref 0.3–1.2)
Total Protein: 7.1 g/dL (ref 6.5–8.1)

## 2019-10-30 LAB — CBC WITH DIFFERENTIAL/PLATELET
Abs Immature Granulocytes: 0.99 10*3/uL — ABNORMAL HIGH (ref 0.00–0.07)
Basophils Absolute: 0.2 10*3/uL — ABNORMAL HIGH (ref 0.0–0.1)
Basophils Relative: 2 %
Eosinophils Absolute: 0 10*3/uL (ref 0.0–0.5)
Eosinophils Relative: 0 %
HCT: 35.8 % — ABNORMAL LOW (ref 39.0–52.0)
Hemoglobin: 11.1 g/dL — ABNORMAL LOW (ref 13.0–17.0)
Immature Granulocytes: 8 %
Lymphocytes Relative: 18 %
Lymphs Abs: 2.3 10*3/uL (ref 0.7–4.0)
MCH: 34.9 pg — ABNORMAL HIGH (ref 26.0–34.0)
MCHC: 31 g/dL (ref 30.0–36.0)
MCV: 112.6 fL — ABNORMAL HIGH (ref 80.0–100.0)
Monocytes Absolute: 1.2 10*3/uL — ABNORMAL HIGH (ref 0.1–1.0)
Monocytes Relative: 10 %
Neutro Abs: 8.1 10*3/uL — ABNORMAL HIGH (ref 1.7–7.7)
Neutrophils Relative %: 62 %
Platelets: 340 10*3/uL (ref 150–400)
RBC: 3.18 MIL/uL — ABNORMAL LOW (ref 4.22–5.81)
RDW: 17.5 % — ABNORMAL HIGH (ref 11.5–15.5)
WBC: 12.8 10*3/uL — ABNORMAL HIGH (ref 4.0–10.5)
nRBC: 0 % (ref 0.0–0.2)

## 2019-11-17 IMAGING — CT CT ABD-PELV W/ CM
2 of 5 series · 16 of 46 positions shown, 18 images · IV contrast (ISOVUE 300)
Comparison: None.

CLINICAL DATA: Increasing anemia. History of myelofibrosis.
Possible myelodysplasia.

EXAM:
CT ABDOMEN AND PELVIS WITH CONTRAST
TECHNIQUE: Multidetector CT imaging of the abdomen and pelvis was performed
using the standard protocol following bolus administration of
intravenous contrast.
CONTRAST:  80 cc [id number]

[Series 2: axial st · axial · 0.93mm/px · z∈[-717,-352]mm · 13 of 87 slices shown, 15 images]
[im 7/87  soft-tissue]
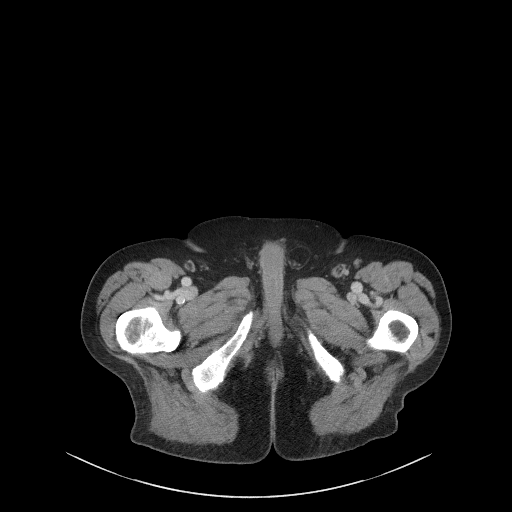
[im 7/87  bone]
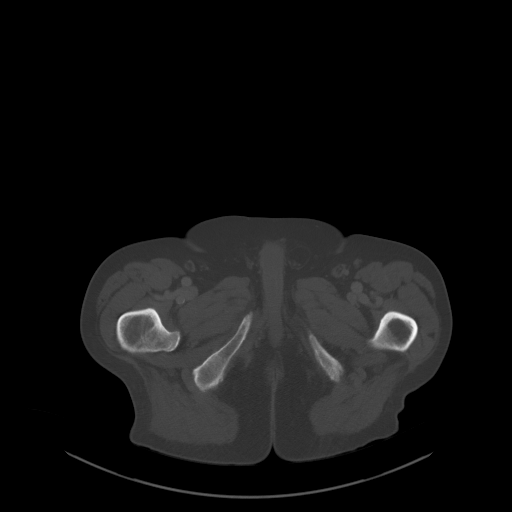
[im 13/87  soft-tissue]
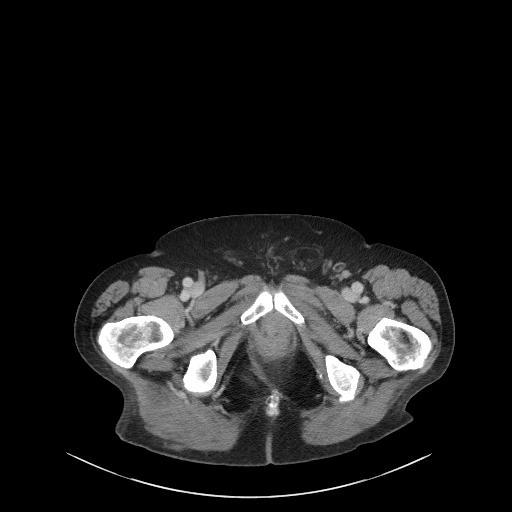
[im 19/87  soft-tissue]
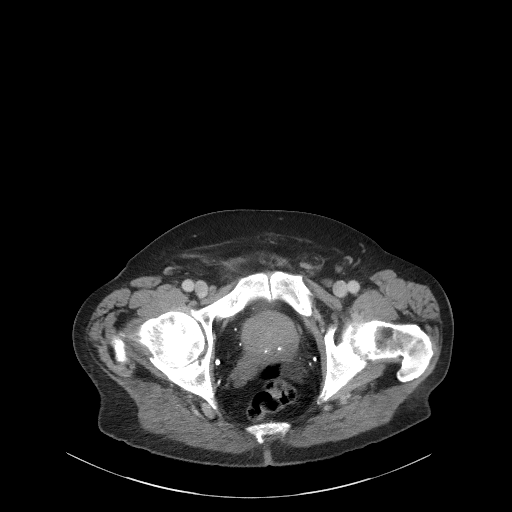
[im 25/87  soft-tissue]
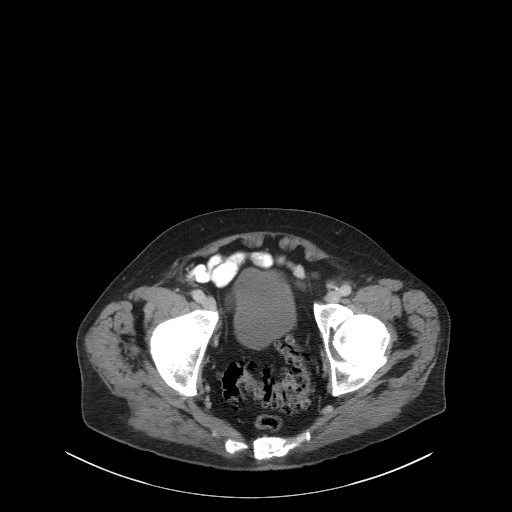
[im 31/87  soft-tissue]
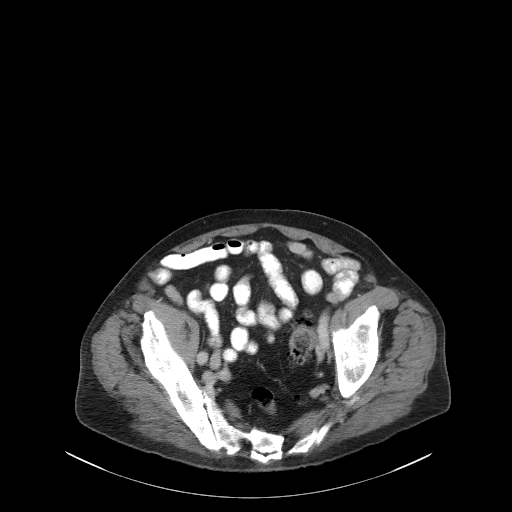
[im 37/87  soft-tissue]
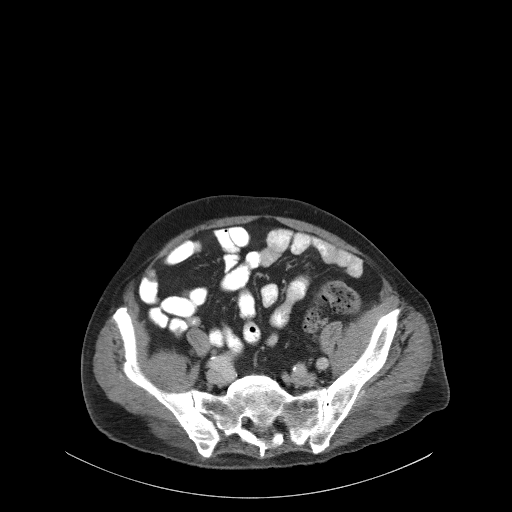
[im 44/87  soft-tissue]
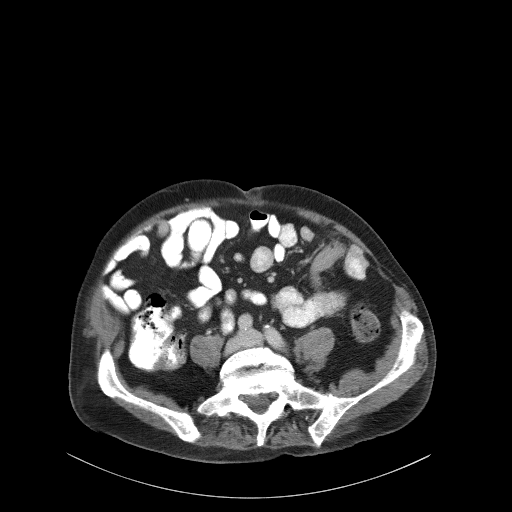
[im 50/87  soft-tissue]
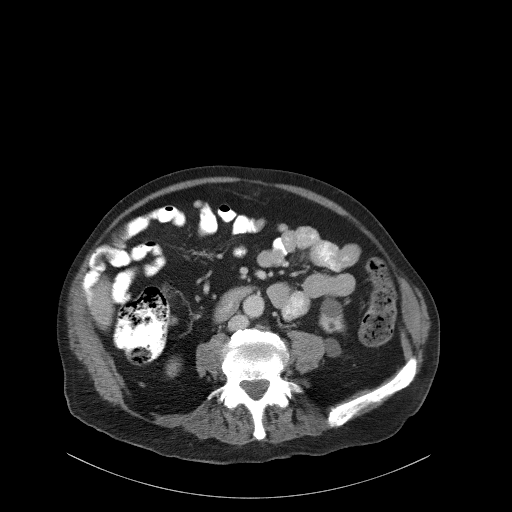
[im 56/87  soft-tissue]
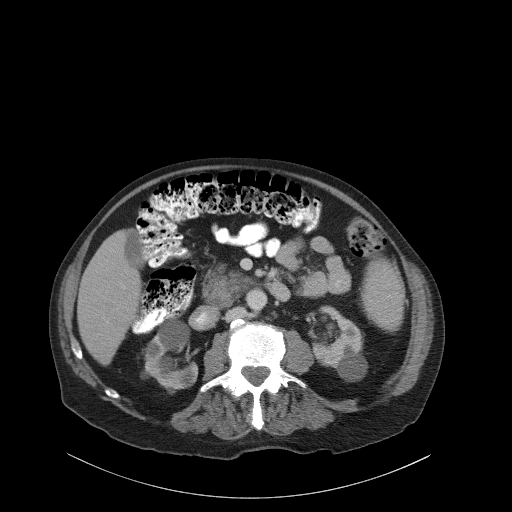
[im 56/87  bone]
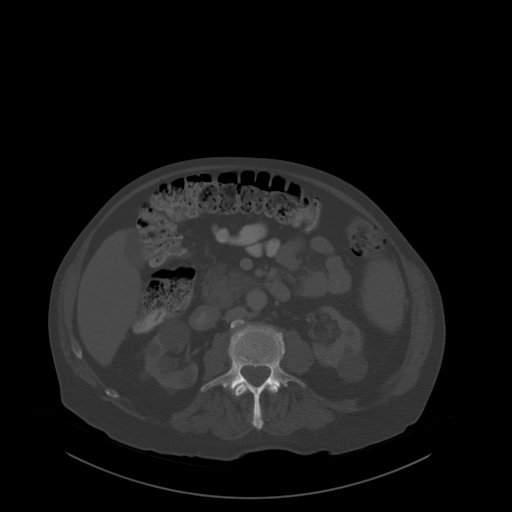
[im 62/87  soft-tissue]
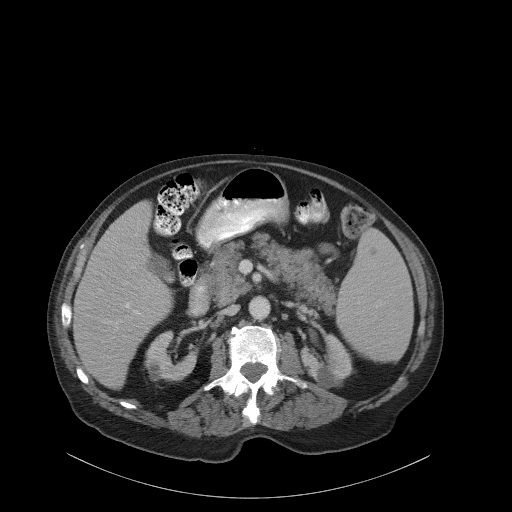
[im 68/87  soft-tissue]
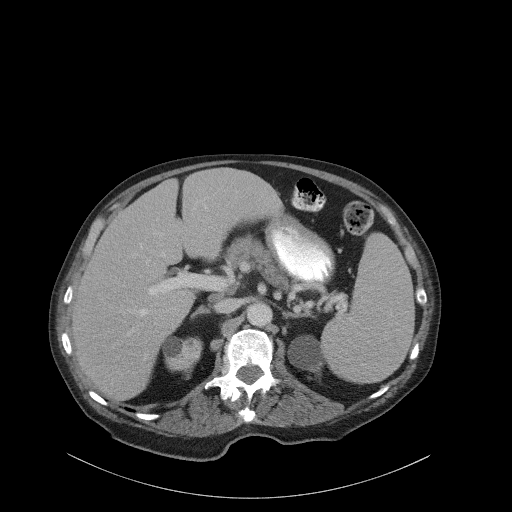
[im 74/87  soft-tissue]
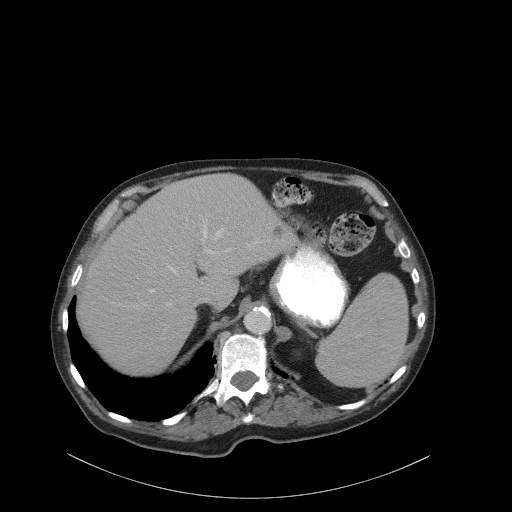
[im 80/87  soft-tissue]
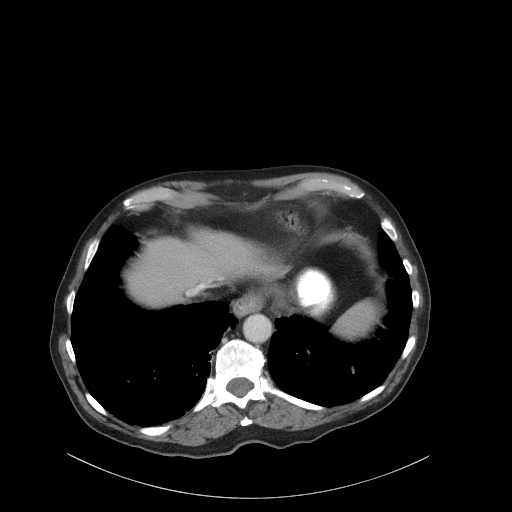

[Series 4: coronal st · coronal · 0.83mm/px · 3 of 98 slices shown]
[im 33/98  soft-tissue]
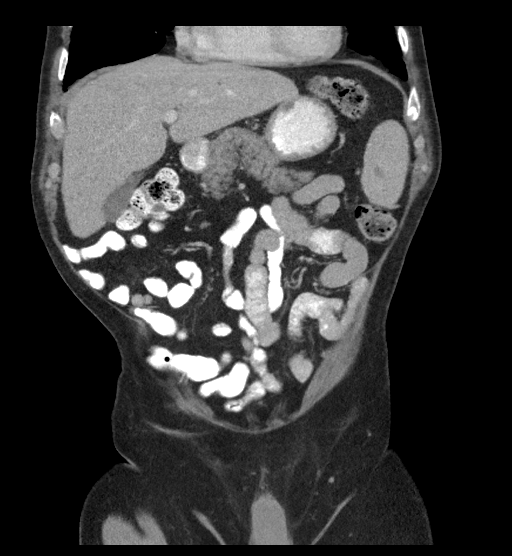
[im 44/98  soft-tissue]
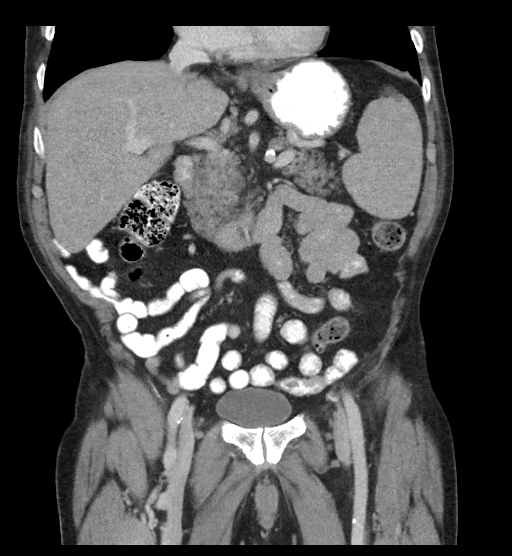
[im 54/98  soft-tissue]
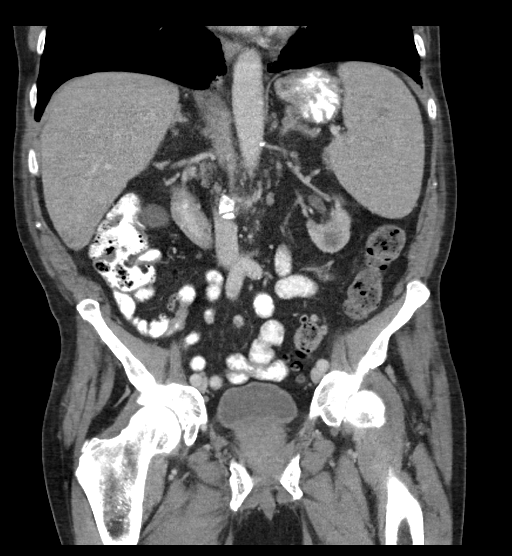

[16 of 46 positions shown; findings below may reference images not displayed]

FINDINGS: Lower chest: Centrilobular emphysema with mild subpleural scarring.
The lung bases are otherwise clear. There is no pleural or
pericardial effusion. Pectus deformity of the sternum noted.

Hepatobiliary: There are several well-circumscribed low-density
hepatic lesions most consistent with cysts, largest measuring 16 mm
in the dome of the left lobe on image 11. No worrisome hepatic
findings. Possible sludge or noncalcified gallstones. No gallbladder
wall thickening, surrounding inflammation or biliary dilatation.

Pancreas: Unremarkable. No pancreatic ductal dilatation or
surrounding inflammatory changes.

Spleen: There are several small, nonspecific low-density splenic
lesions, largest measuring 10 mm on image 15. The spleen measures
14.0 x 8.0 x 13.0 cm (volume = 760 cm^3).

Adrenals/Urinary Tract: Both adrenal glands appear normal. Both
kidneys are small with cortical thinning. There are numerous
low-density renal lesions bilaterally consistent with cysts. No
suspicious renal mass or hydronephrosis. The bladder appears
unremarkable.

Stomach/Bowel: No evidence of bowel wall thickening, distention or
surrounding inflammatory change. There is moderate to severe sigmoid
diverticulosis without evidence of acute inflammation. The appendix
appears normal.

Vascular/Lymphatic: There are no enlarged abdominal or pelvic lymph
nodes. Mild aortic and branch vessel atherosclerosis. No acute
vascular findings.

Reproductive: Mildly enlarged prostate gland, measuring 5.5 x
cm.

Other: No evidence of abdominal wall mass or hernia. No ascites.

Musculoskeletal: No acute or significant osseous findings.
Specifically, no evidence of lytic lesion or pathologic fracture.
There are degenerative changes within the lumbar spine associated
with a grade 1 anterolisthesis at L3-4.
IMPRESSION: 1. Mild-to-moderate splenomegaly with scattered indeterminate
low-density lesions, probably related to the patient's
myelofibrosis.
2. No focal or acute osseous findings.
3. Multiple hepatic and renal cysts. Bilateral renal cortical
thinning.
4. Additional incidental findings including sigmoid diverticulosis,
prostatomegaly, possible gallstones or sludge, and Aortic
Atherosclerosis (HLQLT-DGU.U) and Emphysema (HLQLT-[id number].R).

## 2019-11-19 DIAGNOSIS — H353211 Exudative age-related macular degeneration, right eye, with active choroidal neovascularization: Secondary | ICD-10-CM | POA: Diagnosis not present

## 2019-11-26 NOTE — Progress Notes (Signed)
[facility]  [phone] [phone]   ID: Nicholas [name].   DOB: July 22, 1927  MR#: [id number]  JYN#:[id number]  Patient Care Team: Denita Lung, MD as PCP - General (Family Medicine) Magrinat, Virgie Dad, MD as Consulting Physician (Oncology) Tysinger, Camelia Eng, PA-C as Physician Assistant (Family Medicine)  OTHERS: Dr. Posey Pronto (eye doctor)   CHIEF COMPLAINT:  Essential thrombocytosis and anemia of chronic kidney disease  CURRENT TREATMENT: hydroxyurea; Aranesp   INTERVAL HISTORY: Nicholas [name] returns today for follow-up and treatment of his myeloproliferative disorder.  He is accompanied by his daughter Nicholas [name]  He continues on hydroxyurea, 545m daily.  He tolerates that well with no symptoms that he is aware of.  His white cell count and platelet counts have been well controlled  He is also receiving Aranesp, last received on 10/02/2019.  He tolerates this well.  He is due for another injection today.     REVIEW OF SYSTEMS: Nicholas [name] fell in his bathroom, getting up from the toilet.  In little rug under his feet slipped and he landed on his elbows.  He abraded his skin but did not break anything as far as they can tell.  He continues to be the primary caregiver for his wife who has severe dementia but who in terms of temperament seems to be doing a little bit better.  The family provides them with all their meals, they also feel in the pillboxes and make sure all the medications are taken when they are supposed to be taken.  A detailed review of systems today was otherwise stable   PAST MEDICAL HISTORY: Past Medical History:  Diagnosis Date   Abnormal CT scan, sinus 10/04/06   pansinusitis, mildly deviated septum   Allergy    Anemia    related to CKD   Cancer (HCC)    melanoma skin cancer   Chronic sinusitis    Dysthymia    dysthymia   H/O echocardiogram 01/04/2007   LV function normal, 55-60% EF, mildly increased thickenss of LV wall   H/O  echocardiogram 12/2006   mild LV wall thickening, EF 55-60%, LV function normal   Headache     " sinus"   History of MRI of brain and brain stem 02/26/06   sinusitis, nonspecific deep white matter changes normal for age   Hypertension    Macular degeneration    B/L   Seborrheic keratosis    sees HToni Arthurs NP dermatology   Thrombocytosis ([facility]    sees hematology    PAST SURGICAL HISTORY: Past Surgical History:  Procedure Laterality Date   EYE SURGERY     HERNIA REPAIR     right inguinal repair   I & D EXTREMITY Right 09/16/2017   Procedure: IRRIGATION AND DEBRIDEMENT EXTREMITY;  Surgeon: GRoseanne Kaufman MD;  [place] [facility]  Service: Orthopedics;  Laterality: Right;   PARS PLANA VITRECTOMY Left 07/10/2019   Procedure: Pars Plana Vitrectomy With 25g Removal/Suture Intraocular Lens;  Surgeon: PJalene Mullet MD;  [place] [facility]  Service: Ophthalmology;  Laterality: Left;   TONSILLECTOMY     childhood   VARICOSE VEIN SURGERY     right lower leg    FAMILY HISTORY Family History  Problem Relation Age of Onset   Heart disease Father    Stroke Father    Cancer Neg Hx    COPD Neg Hx    Diabetes Neg Hx     SOCIAL HISTORY:  (Updated December 2020) The patient lives  with his wife, who suffers from diabetes and emphysema and has had some small strokes. She has progressive dementia.  He gives her her insulin shots. He has 2 daughters, Nicholas [name] who lives a mile away and is pretty much retired and International aid/development worker who lives near the [employer] and works with [employer] .The patient has 3 grandchildren.[name]   ADVANCED DIRECTIVES: the patient tells me his daughter Nicholas [name] would make health care decisions if he is incapacitated.  At the 06/12/2019 visit the patient was given the appropriate documents to complete and notarized at his discretion.    HEALTH MAINTENANCE:  Social History   Tobacco Use   Smoking status: Former Smoker    Quit date: 06/14/1958    Years  since quitting: 61.4   Smokeless tobacco: Never Used  Vaping Use   Vaping Use: Never used  Substance Use Topics   Alcohol use: No   Drug use: No     Colonoscopy:  Not on file  PSA: Not on file  Bone density: Never  Lipid panel: October 2014  Allergies  Allergen Reactions   Erythromycin Other (See Comments)    Gums turned red and bled profusely   Tape Other (See Comments)    The patient's SKIN IS VERY THIN AND TEARS AND BRUISES VERY EASILY!!    Current Outpatient Medications  Medication Sig Dispense Refill   acetaminophen (TYLENOL) 650 MG CR tablet Take 650 mg by mouth at bedtime.     aspirin 81 MG tablet Take 1 tablet (81 mg total) by mouth daily. 90 tablet 3   Calcium Carbonate-Vitamin D3 600-400 MG-UNIT TABS Take 1 tablet by mouth 2 (two) times daily. 60 tablet 2   cetirizine (ZYRTEC) 10 MG tablet Take 10 mg by mouth at bedtime.     [employer] D3 25 MCG (1000 UT) capsule TAKE 1 CAPSULE BY MOUTH EVERY DAY (Patient taking differently: Take 1,000 Units by mouth daily. ) 120 capsule 3   FLUoxetine (PROZAC) 20 MG capsule TAKE 1 CAPSULE BY MOUTH EVERY DAY 90 capsule 1   hydroxyurea (HYDREA) 500 MG capsule TAKE 1 CAPSULE (500 MG TOTAL) BY MOUTH DAILY. MAY TAKE WITH FOOD TO MINIMIZE GI SIDE EFFECTS. 90 capsule 3   saccharomyces boulardii (FLORASTOR) 250 MG capsule Take 1 capsule (250 mg total) by mouth 2 (two) times daily.     sodium chloride (OCEAN) 0.65 % nasal spray Place 1 spray into the nose 4 (four) times daily as needed for congestion.      terazosin (HYTRIN) 2 MG capsule TAKE 1 CAPSULE (2 MG TOTAL) BY MOUTH AT BEDTIME. 90 capsule 3   No current facility-administered medications for this visit.    OBJECTIVE: white male who appears stated age 86:   11/27/19 1454  BP: (!) 125/57  Pulse: 86  Resp: 17  Temp: 98.9 F (37.2 C)  SpO2: 97%    Body mass index is 21.44 kg/m.  Filed Weights   11/27/19 1454  Weight: 167 lb (75.8 kg)    ECOG FS:2 - Symptomatic,  <50% confined to bed  Sclerae unicteric, EOMs intact Teeth in very poor condition No cervical or supraclavicular adenopathy Lungs no rales or rhonchi Heart regular rate and rhythm Abd soft, nontender, positive bowel sounds MSK no focal spinal tenderness Neuro: nonfocal, well oriented, appropriate affect   LAB RESULTS:  CBC    Component Value Date/Time   WBC 13.8 (H) 11/27/2019 1435   RBC 2.64 (L) 11/27/2019 1435   HGB 9.7 (L) 11/27/2019  1435   HGB 10.0 (L) 03/20/2019 1355   HGB 11.4 (L) 02/23/2019 1529   HGB 8.7 (L) 06/03/2017 1404   HCT 30.1 (L) 11/27/2019 1435   HCT 32.8 (L) 02/23/2019 1529   HCT 27.6 (L) 06/03/2017 1404   PLT 366 11/27/2019 1435   PLT 350 03/20/2019 1355   PLT 346 02/23/2019 1529   MCV 114.0 (H) 11/27/2019 1435   MCV 102 (H) 02/23/2019 1529   MCV 103.8 (H) 06/03/2017 1404   MCH 36.7 (H) 11/27/2019 1435   MCHC 32.2 11/27/2019 1435   RDW 18.4 (H) 11/27/2019 1435   RDW 16.8 (H) 02/23/2019 1529   RDW 20.5 (H) 06/03/2017 1404   LYMPHSABS PENDING 11/27/2019 1435   LYMPHSABS 2.7 02/23/2019 1529   LYMPHSABS 2.7 06/03/2017 1404   MONOABS PENDING 11/27/2019 1435   MONOABS 2.2 (H) 06/03/2017 1404   EOSABS PENDING 11/27/2019 1435   EOSABS 0.0 02/23/2019 1529   BASOSABS PENDING 11/27/2019 1435   BASOSABS 0.4 (H) 02/23/2019 1529   BASOSABS 0.5 (H) 06/03/2017 1404      Chemistry      Component Value Date/Time   NA 141 10/30/2019 1355   NA 140 02/23/2019 1529   NA 142 06/03/2017 1404   K 4.4 10/30/2019 1355   K 3.9 06/03/2017 1404   CL 107 10/30/2019 1355   CO2 25 10/30/2019 1355   CO2 26 06/03/2017 1404   BUN 31 (H) 10/30/2019 1355   BUN 26 02/23/2019 1529   BUN 19.9 06/03/2017 1404   CREATININE 1.64 (H) 10/30/2019 1355   CREATININE 1.6 (H) 06/03/2017 1404      Component Value Date/Time   CALCIUM 9.0 10/30/2019 1355   CALCIUM 8.9 06/03/2017 1404   ALKPHOS 78 10/30/2019 1355   ALKPHOS 91 06/03/2017 1404   AST 20 10/30/2019 1355   AST 20  06/03/2017 1404   ALT 17 10/30/2019 1355   ALT 12 06/03/2017 1404   BILITOT 0.5 10/30/2019 1355   BILITOT 0.48 06/03/2017 1404      STUDIES: No results found.    ASSESSMENT: 86 y.o.  [place] man with   (1) essential thrombocytosis initially diagnosed December 2003,  controlled on anagrelide 0.5 mg daily and aspirin 81 mg daily initially, stopped 06/2017  (a) no evidence of leukemic transformation  (b) Hydrea 514m daily starting in 07/2017  (2) anemia, secondary to chronic kidney disease, receiving aranesp   (a) dose increased to 500 mg every 4 weeks starting 09/04/2019  (3) concern regarding possible transition to myelofibrosis on bone marrow biopsy 06/22/2017,  (a) normal cytogenetics  (b) FISH WNL   PLAN: Bobie continues on Hydrea with good control of his thrombocytosis and leukocytosis and no side effects.  The plan is to continue that indefinitely so long as there is evidence of control.  His hemoglobin is a little bit down today.  He is now being treated every 4 weeks.  If his hemoglobin is under 11 he will get a shot and otherwise we passed onto the next 4-week visit.  He prefers to receive his shots in our office and not in the treatment area and we are glad to accommodate.  We discussed fall precautions.  The family can help by removing all the little movable rugs making sure there are no wires lying across their path and trying to make sure there are no loose shoes or balls on the floor which would cause either a huge her or his wife to fall.  Aside from that I  urged him to use his cane.  He understands falling breaking a leg getting a clot on dying is an extremely common event in the very elderly and that is something he can avoid.  His big motivation of course is the fact that he is the primary caregiver to his wife.  We will continue lab work every 4 weeks and visit every 3 months  Total encounter time 25 minutes.Sarajane Jews C. Jeovany Huitron, MD 11/27/19 3:01  PM Medical Oncology and Hematology [facility] [place], [place] [address] Tel. [phone]    Fax. [phone]   I, Wilburn Mylar, am acting as scribe for Dr. Virgie Dad. Dwayn Moravek.  I, Lurline Del MD, have reviewed the above documentation for accuracy and completeness, and I agree with the above.   *Total Encounter Time as defined by the Centers for Medicare and Medicaid Services includes, in addition to the face-to-face time of a patient visit (documented in the note above) non-face-to-face time: obtaining and reviewing outside history, ordering and reviewing medications, tests or procedures, care coordination (communications with other health care professionals or caregivers) and documentation in the medical record.

## 2019-11-27 ENCOUNTER — Other Ambulatory Visit: Payer: Self-pay

## 2019-11-27 ENCOUNTER — Inpatient Hospital Stay: Payer: Medicare Other | Attending: Oncology

## 2019-11-27 ENCOUNTER — Inpatient Hospital Stay (HOSPITAL_BASED_OUTPATIENT_CLINIC_OR_DEPARTMENT_OTHER): Payer: Medicare Other | Admitting: Oncology

## 2019-11-27 ENCOUNTER — Inpatient Hospital Stay: Payer: Medicare Other

## 2019-11-27 VITALS — BP 125/57 | HR 86 | Temp 98.9°F | Resp 17 | Ht 74.0 in | Wt 167.0 lb

## 2019-11-27 DIAGNOSIS — D473 Essential (hemorrhagic) thrombocythemia: Secondary | ICD-10-CM

## 2019-11-27 DIAGNOSIS — N183 Chronic kidney disease, stage 3 unspecified: Secondary | ICD-10-CM | POA: Diagnosis not present

## 2019-11-27 DIAGNOSIS — D631 Anemia in chronic kidney disease: Secondary | ICD-10-CM | POA: Diagnosis not present

## 2019-11-27 DIAGNOSIS — N189 Chronic kidney disease, unspecified: Secondary | ICD-10-CM | POA: Diagnosis not present

## 2019-11-27 DIAGNOSIS — N1831 Chronic kidney disease, stage 3a: Secondary | ICD-10-CM

## 2019-11-27 DIAGNOSIS — R161 Splenomegaly, not elsewhere classified: Secondary | ICD-10-CM

## 2019-11-27 LAB — CMP (CANCER CENTER ONLY)
ALT: 23 U/L (ref 0–44)
AST: 21 U/L (ref 15–41)
Albumin: 3.6 g/dL (ref 3.5–5.0)
Alkaline Phosphatase: 108 U/L (ref 38–126)
Anion gap: 10 (ref 5–15)
BUN: 32 mg/dL — ABNORMAL HIGH (ref 8–23)
CO2: 25 mmol/L (ref 22–32)
Calcium: 9 mg/dL (ref 8.9–10.3)
Chloride: 109 mmol/L (ref 98–111)
Creatinine: 1.63 mg/dL — ABNORMAL HIGH (ref 0.61–1.24)
GFR, Est AFR Am: 43 mL/min — ABNORMAL LOW (ref >60)
GFR, Estimated: 37 mL/min — ABNORMAL LOW (ref >60)
Glucose, Bld: 116 mg/dL — ABNORMAL HIGH (ref 70–99)
Potassium: 4.4 mmol/L (ref 3.5–5.1)
Sodium: 144 mmol/L (ref 135–145)
Total Bilirubin: 0.7 mg/dL (ref 0.3–1.2)
Total Protein: 7.2 g/dL (ref 6.5–8.1)

## 2019-11-27 LAB — CBC WITH DIFFERENTIAL/PLATELET
Abs Immature Granulocytes: 1.26 10*3/uL — ABNORMAL HIGH (ref 0.00–0.07)
Basophils Absolute: 0.2 10*3/uL — ABNORMAL HIGH (ref 0.0–0.1)
Basophils Relative: 2 %
Eosinophils Absolute: 0 10*3/uL (ref 0.0–0.5)
Eosinophils Relative: 0 %
HCT: 30.1 % — ABNORMAL LOW (ref 39.0–52.0)
Hemoglobin: 9.7 g/dL — ABNORMAL LOW (ref 13.0–17.0)
Immature Granulocytes: 9 %
Lymphocytes Relative: 16 %
Lymphs Abs: 2.3 10*3/uL (ref 0.7–4.0)
MCH: 36.7 pg — ABNORMAL HIGH (ref 26.0–34.0)
MCHC: 32.2 g/dL (ref 30.0–36.0)
MCV: 114 fL — ABNORMAL HIGH (ref 80.0–100.0)
Monocytes Absolute: 1.9 10*3/uL — ABNORMAL HIGH (ref 0.1–1.0)
Monocytes Relative: 14 %
Neutro Abs: 8.1 10*3/uL — ABNORMAL HIGH (ref 1.7–7.7)
Neutrophils Relative %: 59 %
Platelets: 366 10*3/uL (ref 150–400)
RBC: 2.64 MIL/uL — ABNORMAL LOW (ref 4.22–5.81)
RDW: 18.4 % — ABNORMAL HIGH (ref 11.5–15.5)
WBC: 13.8 10*3/uL — ABNORMAL HIGH (ref 4.0–10.5)
nRBC: 1.6 % — ABNORMAL HIGH (ref 0.0–0.2)

## 2019-11-27 MED ORDER — DARBEPOETIN ALFA 500 MCG/ML IJ SOSY
500.0000 ug | PREFILLED_SYRINGE | Freq: Once | INTRAMUSCULAR | Status: AC
  Administered 2019-11-27: 500 ug via SUBCUTANEOUS

## 2019-11-27 MED ORDER — DARBEPOETIN ALFA 500 MCG/ML IJ SOSY
PREFILLED_SYRINGE | INTRAMUSCULAR | Status: AC
  Filled 2019-11-27: qty 1

## 2019-11-28 ENCOUNTER — Telehealth: Payer: Self-pay | Admitting: Oncology

## 2019-11-28 NOTE — Telephone Encounter (Signed)
Scheduled office visit between already schedule lab and injection appt per 6/15 los. Left voicemail with new appt time.

## 2019-12-25 ENCOUNTER — Inpatient Hospital Stay: Payer: Medicare Other | Attending: Oncology

## 2019-12-25 ENCOUNTER — Inpatient Hospital Stay: Payer: Medicare Other

## 2019-12-25 ENCOUNTER — Other Ambulatory Visit: Payer: Self-pay

## 2019-12-25 VITALS — BP 112/52 | HR 74 | Temp 98.3°F | Resp 18

## 2019-12-25 DIAGNOSIS — R161 Splenomegaly, not elsewhere classified: Secondary | ICD-10-CM

## 2019-12-25 DIAGNOSIS — D631 Anemia in chronic kidney disease: Secondary | ICD-10-CM | POA: Insufficient documentation

## 2019-12-25 DIAGNOSIS — N189 Chronic kidney disease, unspecified: Secondary | ICD-10-CM | POA: Diagnosis not present

## 2019-12-25 DIAGNOSIS — N183 Chronic kidney disease, stage 3 unspecified: Secondary | ICD-10-CM

## 2019-12-25 DIAGNOSIS — N1831 Chronic kidney disease, stage 3a: Secondary | ICD-10-CM

## 2019-12-25 DIAGNOSIS — D473 Essential (hemorrhagic) thrombocythemia: Secondary | ICD-10-CM

## 2019-12-25 LAB — CBC WITH DIFFERENTIAL/PLATELET
Abs Immature Granulocytes: 1.26 10*3/uL — ABNORMAL HIGH (ref 0.00–0.07)
Basophils Absolute: 0.2 10*3/uL — ABNORMAL HIGH (ref 0.0–0.1)
Basophils Relative: 2 %
Eosinophils Absolute: 0 10*3/uL (ref 0.0–0.5)
Eosinophils Relative: 0 %
HCT: 30.9 % — ABNORMAL LOW (ref 39.0–52.0)
Hemoglobin: 10.1 g/dL — ABNORMAL LOW (ref 13.0–17.0)
Immature Granulocytes: 9 %
Lymphocytes Relative: 15 %
Lymphs Abs: 2 10*3/uL (ref 0.7–4.0)
MCH: 37.1 pg — ABNORMAL HIGH (ref 26.0–34.0)
MCHC: 32.7 g/dL (ref 30.0–36.0)
MCV: 113.6 fL — ABNORMAL HIGH (ref 80.0–100.0)
Monocytes Absolute: 1.7 10*3/uL — ABNORMAL HIGH (ref 0.1–1.0)
Monocytes Relative: 12 %
Neutro Abs: 8.5 10*3/uL — ABNORMAL HIGH (ref 1.7–7.7)
Neutrophils Relative %: 62 %
Platelets: 348 10*3/uL (ref 150–400)
RBC: 2.72 MIL/uL — ABNORMAL LOW (ref 4.22–5.81)
RDW: 19.3 % — ABNORMAL HIGH (ref 11.5–15.5)
WBC: 13.7 10*3/uL — ABNORMAL HIGH (ref 4.0–10.5)
nRBC: 0.4 % — ABNORMAL HIGH (ref 0.0–0.2)

## 2019-12-25 LAB — CMP (CANCER CENTER ONLY)
ALT: 17 U/L (ref 0–44)
AST: 21 U/L (ref 15–41)
Albumin: 3.6 g/dL (ref 3.5–5.0)
Alkaline Phosphatase: 77 U/L (ref 38–126)
Anion gap: 9 (ref 5–15)
BUN: 32 mg/dL — ABNORMAL HIGH (ref 8–23)
CO2: 22 mmol/L (ref 22–32)
Calcium: 8.9 mg/dL (ref 8.9–10.3)
Chloride: 110 mmol/L (ref 98–111)
Creatinine: 1.52 mg/dL — ABNORMAL HIGH (ref 0.61–1.24)
GFR, Est AFR Am: 46 mL/min — ABNORMAL LOW (ref >60)
GFR, Estimated: 40 mL/min — ABNORMAL LOW (ref >60)
Glucose, Bld: 103 mg/dL — ABNORMAL HIGH (ref 70–99)
Potassium: 4.3 mmol/L (ref 3.5–5.1)
Sodium: 141 mmol/L (ref 135–145)
Total Bilirubin: 0.6 mg/dL (ref 0.3–1.2)
Total Protein: 7.1 g/dL (ref 6.5–8.1)

## 2019-12-25 MED ORDER — DARBEPOETIN ALFA 500 MCG/ML IJ SOSY
PREFILLED_SYRINGE | INTRAMUSCULAR | Status: AC
  Filled 2019-12-25: qty 1

## 2019-12-25 MED ORDER — DARBEPOETIN ALFA 500 MCG/ML IJ SOSY
500.0000 ug | PREFILLED_SYRINGE | Freq: Once | INTRAMUSCULAR | Status: AC
  Administered 2019-12-25: 500 ug via SUBCUTANEOUS

## 2020-01-05 IMAGING — CR DG HAND COMPLETE 3+V*R*
3 series · 3 of 3 positions shown · non-contrast
Comparison: None.

CLINICAL DATA: Dog bite to right thumb.

EXAM:
RIGHT HAND - COMPLETE 3+ VIEW

[hand pa]
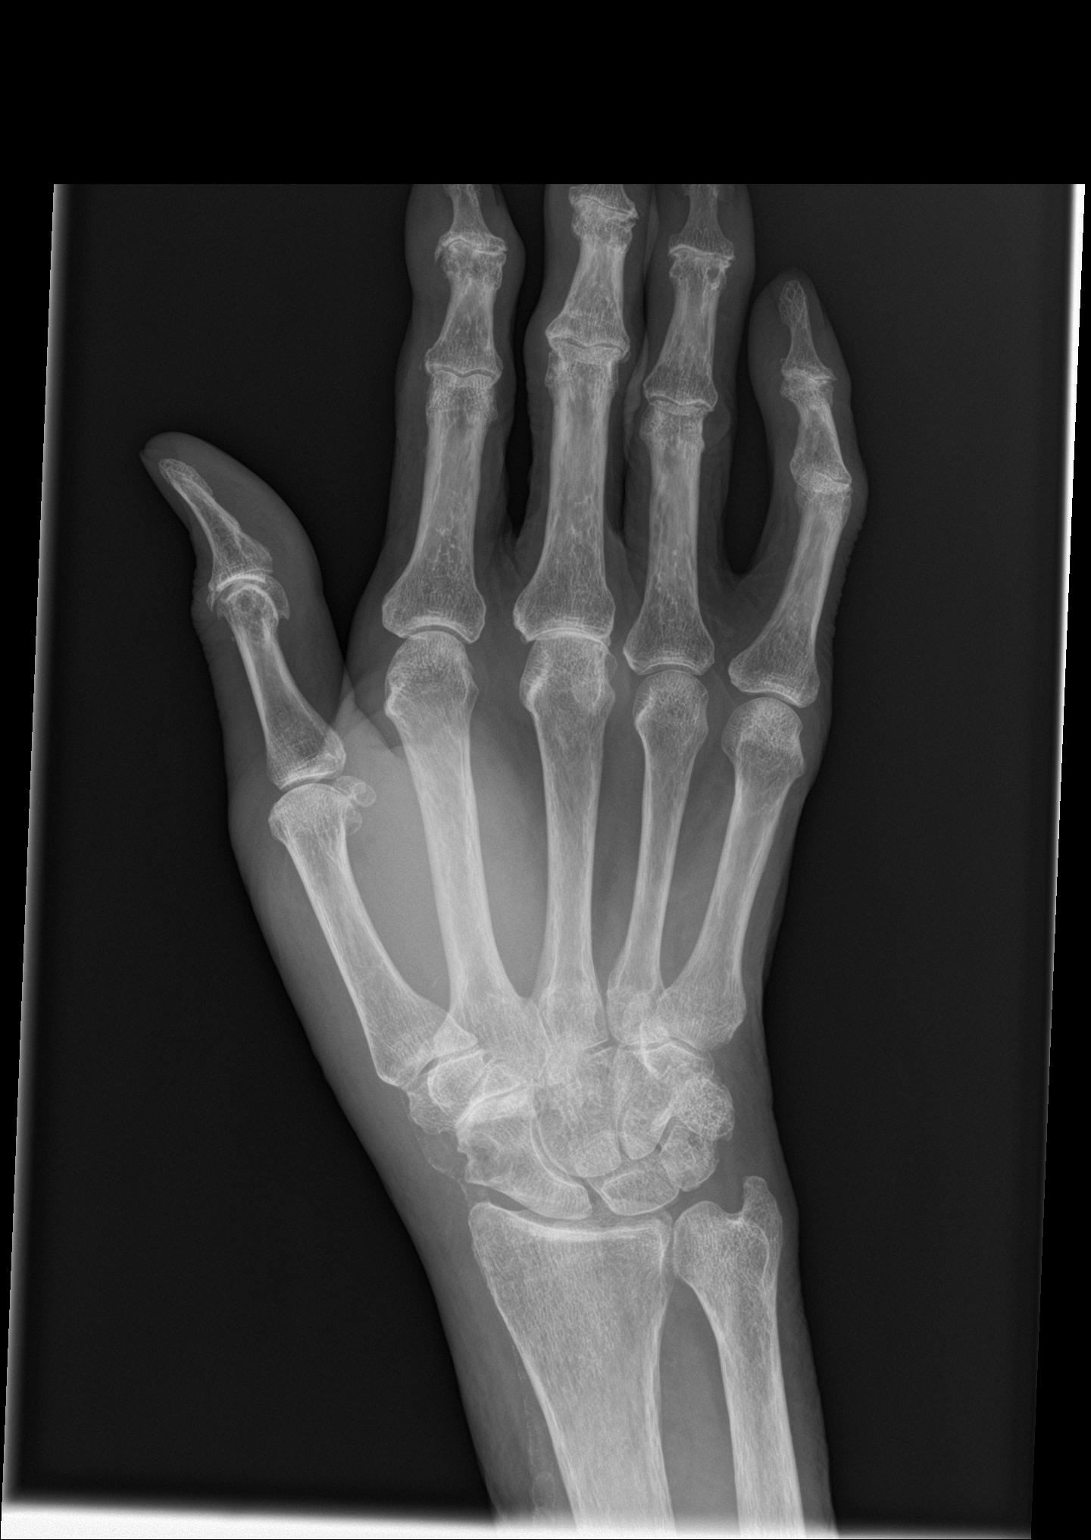

[hand obl]
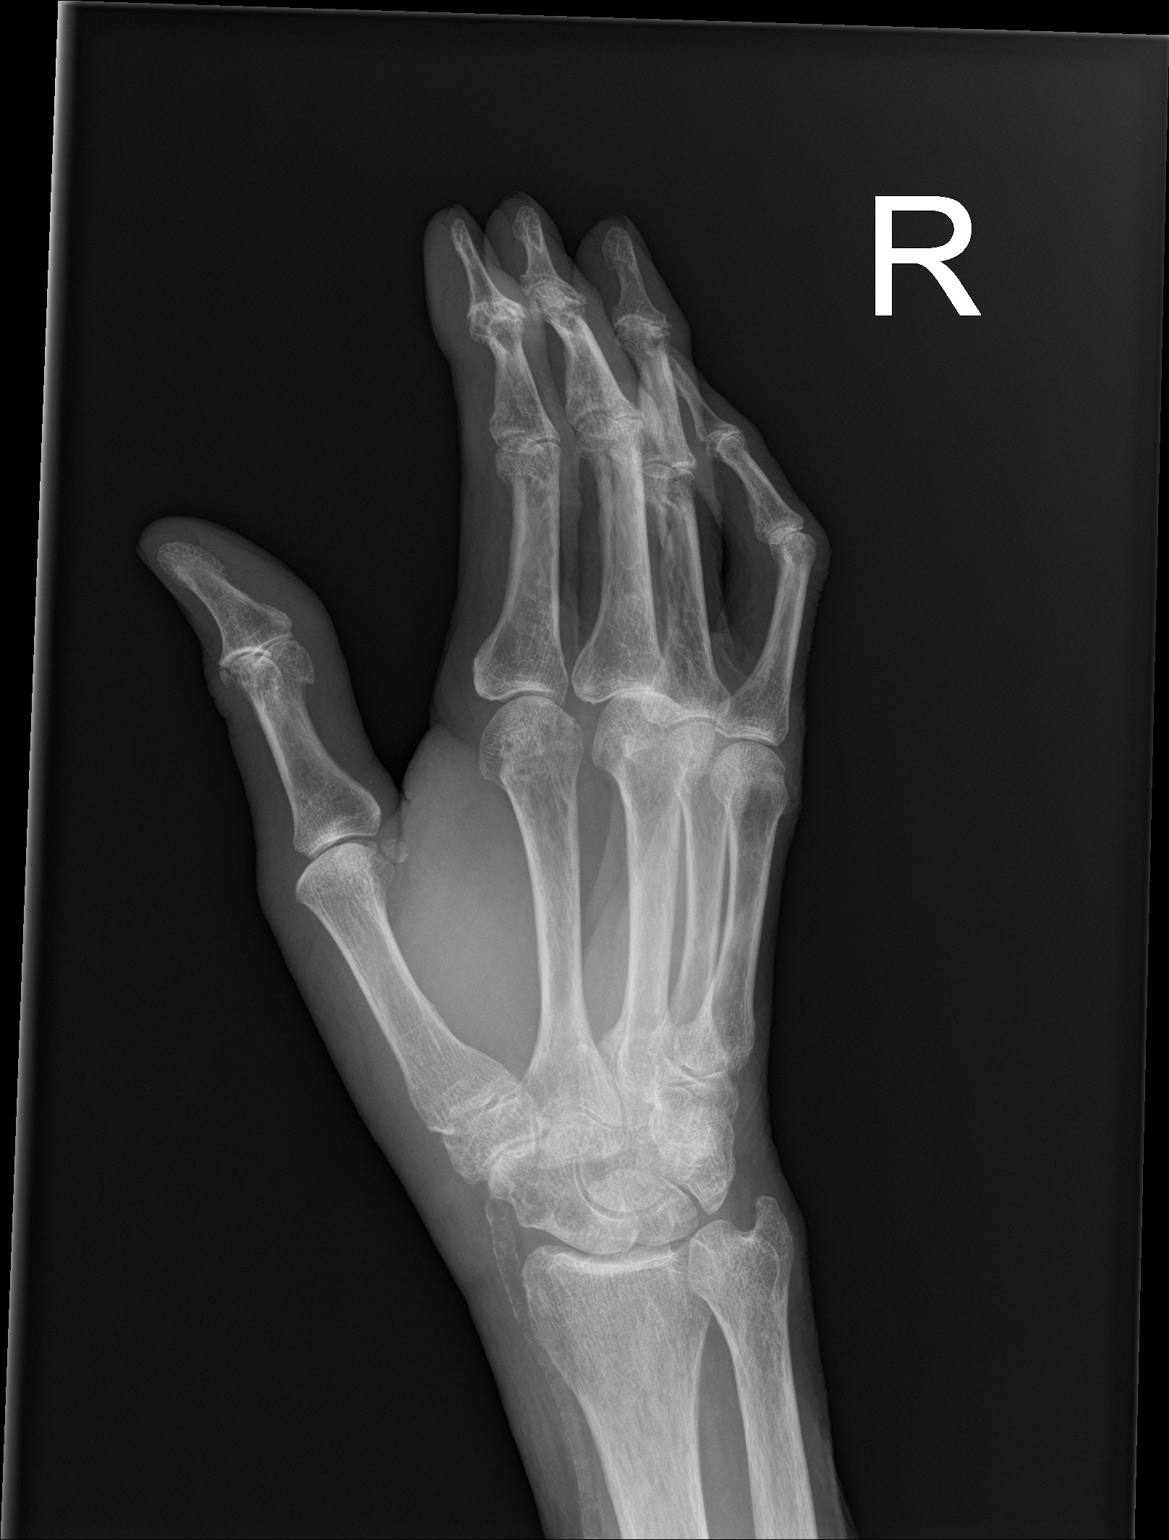

[hand lat]
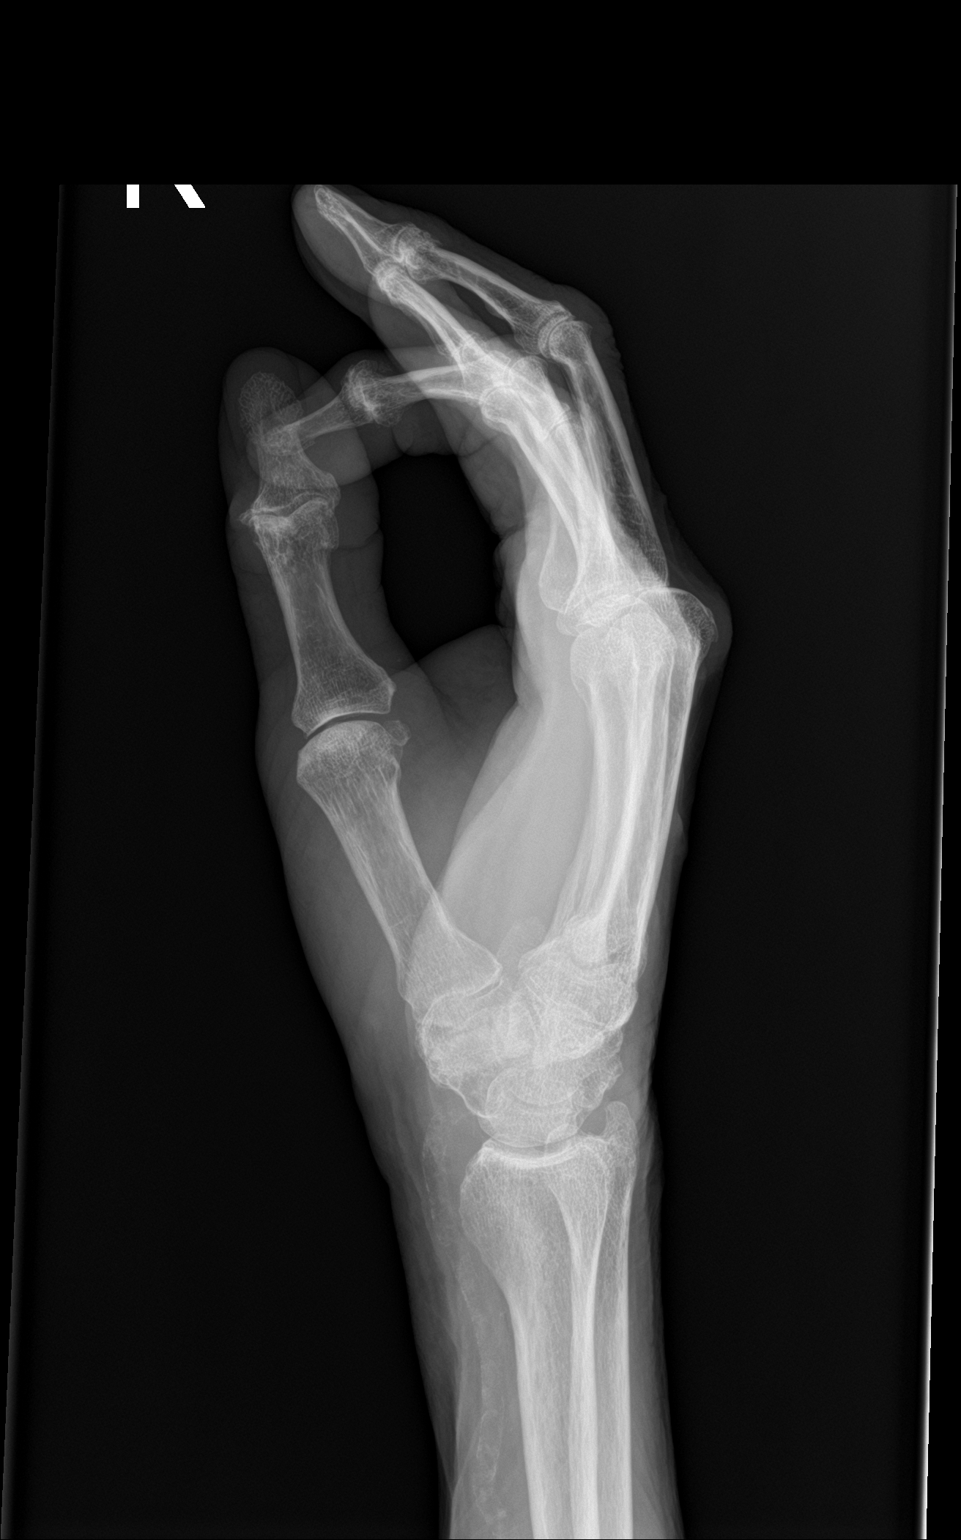

[3 of 3 positions shown; findings below may reference images not displayed]

FINDINGS: No acute fracture or dislocation. Moderate osteoarthritis of the DIP
joints and third MCP joint. Mild osteoarthritis of the thumb IP
joint, PIP joints, and scaphotrapeziotrapezoid joint. Diffuse
osteopenia. Vascular calcifications.
IMPRESSION: 1. No acute osseous abnormality.
2. Mild-to-moderate osteoarthritis of the hand.

## 2020-01-22 ENCOUNTER — Inpatient Hospital Stay: Payer: Medicare Other

## 2020-01-24 DIAGNOSIS — H353211 Exudative age-related macular degeneration, right eye, with active choroidal neovascularization: Secondary | ICD-10-CM | POA: Diagnosis not present

## 2020-01-28 ENCOUNTER — Telehealth: Payer: Self-pay | Admitting: Adult Health

## 2020-01-28 NOTE — Telephone Encounter (Signed)
Rescheduled appts per GM on PAL and 8/16 sch msg. Pt's daughter confirmed new appt date and time. Pt's daughter stated she would call back if new appt date and time does not work for her sister.

## 2020-01-29 ENCOUNTER — Other Ambulatory Visit: Payer: Self-pay

## 2020-01-29 ENCOUNTER — Inpatient Hospital Stay: Payer: Medicare Other

## 2020-01-29 ENCOUNTER — Inpatient Hospital Stay: Payer: Medicare Other | Attending: Oncology

## 2020-01-29 DIAGNOSIS — N189 Chronic kidney disease, unspecified: Secondary | ICD-10-CM | POA: Insufficient documentation

## 2020-01-29 DIAGNOSIS — D631 Anemia in chronic kidney disease: Secondary | ICD-10-CM

## 2020-01-29 DIAGNOSIS — D473 Essential (hemorrhagic) thrombocythemia: Secondary | ICD-10-CM

## 2020-01-29 DIAGNOSIS — N183 Chronic kidney disease, stage 3 unspecified: Secondary | ICD-10-CM

## 2020-01-29 DIAGNOSIS — N1831 Chronic kidney disease, stage 3a: Secondary | ICD-10-CM

## 2020-01-29 DIAGNOSIS — R161 Splenomegaly, not elsewhere classified: Secondary | ICD-10-CM

## 2020-01-29 LAB — CMP (CANCER CENTER ONLY)
ALT: 14 U/L (ref 0–44)
AST: 18 U/L (ref 15–41)
Albumin: 3.6 g/dL (ref 3.5–5.0)
Alkaline Phosphatase: 74 U/L (ref 38–126)
Anion gap: 8 (ref 5–15)
BUN: 32 mg/dL — ABNORMAL HIGH (ref 8–23)
CO2: 25 mmol/L (ref 22–32)
Calcium: 9.4 mg/dL (ref 8.9–10.3)
Chloride: 109 mmol/L (ref 98–111)
Creatinine: 1.73 mg/dL — ABNORMAL HIGH (ref 0.61–1.24)
GFR, Est AFR Am: 40 mL/min — ABNORMAL LOW (ref >60)
GFR, Estimated: 34 mL/min — ABNORMAL LOW (ref >60)
Glucose, Bld: 85 mg/dL (ref 70–99)
Potassium: 4.4 mmol/L (ref 3.5–5.1)
Sodium: 142 mmol/L (ref 135–145)
Total Bilirubin: 0.5 mg/dL (ref 0.3–1.2)
Total Protein: 7 g/dL (ref 6.5–8.1)

## 2020-01-29 LAB — CBC WITH DIFFERENTIAL/PLATELET
Abs Immature Granulocytes: 0.99 10*3/uL — ABNORMAL HIGH (ref 0.00–0.07)
Basophils Absolute: 0.2 10*3/uL — ABNORMAL HIGH (ref 0.0–0.1)
Basophils Relative: 2 %
Eosinophils Absolute: 0 10*3/uL (ref 0.0–0.5)
Eosinophils Relative: 0 %
HCT: 31.3 % — ABNORMAL LOW (ref 39.0–52.0)
Hemoglobin: 10.4 g/dL — ABNORMAL LOW (ref 13.0–17.0)
Immature Granulocytes: 8 %
Lymphocytes Relative: 17 %
Lymphs Abs: 2.1 10*3/uL (ref 0.7–4.0)
MCH: 37.8 pg — ABNORMAL HIGH (ref 26.0–34.0)
MCHC: 33.2 g/dL (ref 30.0–36.0)
MCV: 113.8 fL — ABNORMAL HIGH (ref 80.0–100.0)
Monocytes Absolute: 1.5 10*3/uL — ABNORMAL HIGH (ref 0.1–1.0)
Monocytes Relative: 12 %
Neutro Abs: 7.8 10*3/uL — ABNORMAL HIGH (ref 1.7–7.7)
Neutrophils Relative %: 62 %
Platelets: 307 10*3/uL (ref 150–400)
RBC: 2.75 MIL/uL — ABNORMAL LOW (ref 4.22–5.81)
RDW: 19 % — ABNORMAL HIGH (ref 11.5–15.5)
WBC: 12.7 10*3/uL — ABNORMAL HIGH (ref 4.0–10.5)
nRBC: 0.2 % (ref 0.0–0.2)

## 2020-01-29 MED ORDER — DARBEPOETIN ALFA 500 MCG/ML IJ SOSY
PREFILLED_SYRINGE | INTRAMUSCULAR | Status: AC
  Filled 2020-01-29: qty 1

## 2020-01-29 MED ORDER — DARBEPOETIN ALFA 500 MCG/ML IJ SOSY: 500.0000 ug | PREFILLED_SYRINGE | Freq: Once | INTRAMUSCULAR | Status: DC

## 2020-01-29 NOTE — Progress Notes (Signed)
Pt Hgb is 10.4 today, per pharmacy insurance only provides coverage if greater than 11.   No injection administered today, patient and patient's daughter notified.  Verbalized understanding and agreement.

## 2020-01-30 LAB — PATHOLOGIST SMEAR REVIEW

## 2020-02-11 ENCOUNTER — Telehealth: Payer: Self-pay | Admitting: Adult Health

## 2020-02-11 NOTE — Telephone Encounter (Signed)
rescheduled 9/9 appts to a later time. LC is out of the office and other APPs had no availability around pt's original arrival time. Left voicemail with new appt time.

## 2020-02-19 ENCOUNTER — Other Ambulatory Visit: Payer: Medicare Other

## 2020-02-19 ENCOUNTER — Ambulatory Visit: Payer: Medicare Other | Admitting: Nurse Practitioner

## 2020-02-19 ENCOUNTER — Ambulatory Visit: Payer: Medicare Other

## 2020-02-21 ENCOUNTER — Telehealth: Payer: Self-pay

## 2020-02-21 ENCOUNTER — Ambulatory Visit: Payer: Medicare Other | Admitting: Adult Health

## 2020-02-21 ENCOUNTER — Inpatient Hospital Stay: Payer: Medicare Other | Admitting: Medical

## 2020-02-21 ENCOUNTER — Inpatient Hospital Stay: Payer: Medicare Other

## 2020-02-21 ENCOUNTER — Inpatient Hospital Stay: Payer: Medicare Other | Attending: Oncology

## 2020-02-21 ENCOUNTER — Other Ambulatory Visit: Payer: Medicare Other

## 2020-02-21 ENCOUNTER — Ambulatory Visit: Payer: Medicare Other

## 2020-02-21 DIAGNOSIS — D631 Anemia in chronic kidney disease: Secondary | ICD-10-CM | POA: Insufficient documentation

## 2020-02-21 DIAGNOSIS — N189 Chronic kidney disease, unspecified: Secondary | ICD-10-CM | POA: Insufficient documentation

## 2020-02-21 NOTE — Progress Notes (Signed)
Patient was a no show for his appointment.  Sandi Mealy, MHS, PA-C

## 2020-02-21 NOTE — Telephone Encounter (Signed)
PROGRESS NOTE: Pt did not show up for injection appointment today. Made Val RN aware so that she could follow up with patient.

## 2020-02-24 ENCOUNTER — Other Ambulatory Visit: Payer: Self-pay | Admitting: Family Medicine

## 2020-02-25 NOTE — Telephone Encounter (Signed)
[facility] is requesting to fill pt prozac. Please advise [facility]

## 2020-02-26 ENCOUNTER — Other Ambulatory Visit: Payer: Self-pay

## 2020-02-26 ENCOUNTER — Inpatient Hospital Stay: Payer: Medicare Other

## 2020-02-26 ENCOUNTER — Telehealth: Payer: Self-pay | Admitting: Oncology

## 2020-02-26 ENCOUNTER — Inpatient Hospital Stay (HOSPITAL_BASED_OUTPATIENT_CLINIC_OR_DEPARTMENT_OTHER): Payer: Medicare Other | Admitting: Oncology

## 2020-02-26 VITALS — BP 128/58 | HR 77 | Temp 98.7°F | Resp 18 | Ht 74.0 in | Wt 164.8 lb

## 2020-02-26 DIAGNOSIS — N1831 Chronic kidney disease, stage 3a: Secondary | ICD-10-CM | POA: Diagnosis not present

## 2020-02-26 DIAGNOSIS — D473 Essential (hemorrhagic) thrombocythemia: Secondary | ICD-10-CM

## 2020-02-26 DIAGNOSIS — N1832 Chronic kidney disease, stage 3b: Secondary | ICD-10-CM

## 2020-02-26 DIAGNOSIS — N189 Chronic kidney disease, unspecified: Secondary | ICD-10-CM | POA: Diagnosis not present

## 2020-02-26 DIAGNOSIS — N183 Chronic kidney disease, stage 3 unspecified: Secondary | ICD-10-CM

## 2020-02-26 DIAGNOSIS — Z7189 Other specified counseling: Secondary | ICD-10-CM

## 2020-02-26 DIAGNOSIS — D631 Anemia in chronic kidney disease: Secondary | ICD-10-CM | POA: Diagnosis not present

## 2020-02-26 DIAGNOSIS — R161 Splenomegaly, not elsewhere classified: Secondary | ICD-10-CM

## 2020-02-26 LAB — CMP (CANCER CENTER ONLY)
ALT: 14 U/L (ref 0–44)
AST: 20 U/L (ref 15–41)
Albumin: 3.5 g/dL (ref 3.5–5.0)
Alkaline Phosphatase: 74 U/L (ref 38–126)
Anion gap: 7 (ref 5–15)
BUN: 31 mg/dL — ABNORMAL HIGH (ref 8–23)
CO2: 24 mmol/L (ref 22–32)
Calcium: 8.8 mg/dL — ABNORMAL LOW (ref 8.9–10.3)
Chloride: 109 mmol/L (ref 98–111)
Creatinine: 1.81 mg/dL — ABNORMAL HIGH (ref 0.61–1.24)
GFR, Est AFR Am: 38 mL/min — ABNORMAL LOW (ref >60)
GFR, Estimated: 32 mL/min — ABNORMAL LOW (ref >60)
Glucose, Bld: 105 mg/dL — ABNORMAL HIGH (ref 70–99)
Potassium: 4.3 mmol/L (ref 3.5–5.1)
Sodium: 140 mmol/L (ref 135–145)
Total Bilirubin: 0.5 mg/dL (ref 0.3–1.2)
Total Protein: 7 g/dL (ref 6.5–8.1)

## 2020-02-26 LAB — CBC WITH DIFFERENTIAL/PLATELET
Abs Immature Granulocytes: 1.36 10*3/uL — ABNORMAL HIGH (ref 0.00–0.07)
Basophils Absolute: 0.2 10*3/uL — ABNORMAL HIGH (ref 0.0–0.1)
Basophils Relative: 1 %
Eosinophils Absolute: 0 10*3/uL (ref 0.0–0.5)
Eosinophils Relative: 0 %
HCT: 28.9 % — ABNORMAL LOW (ref 39.0–52.0)
Hemoglobin: 9.6 g/dL — ABNORMAL LOW (ref 13.0–17.0)
Immature Granulocytes: 9 %
Lymphocytes Relative: 15 %
Lymphs Abs: 2.2 10*3/uL (ref 0.7–4.0)
MCH: 37.2 pg — ABNORMAL HIGH (ref 26.0–34.0)
MCHC: 33.2 g/dL (ref 30.0–36.0)
MCV: 112 fL — ABNORMAL HIGH (ref 80.0–100.0)
Monocytes Absolute: 1.6 10*3/uL — ABNORMAL HIGH (ref 0.1–1.0)
Monocytes Relative: 11 %
Neutro Abs: 9.4 10*3/uL — ABNORMAL HIGH (ref 1.7–7.7)
Neutrophils Relative %: 64 %
Platelets: 342 10*3/uL (ref 150–400)
RBC: 2.58 MIL/uL — ABNORMAL LOW (ref 4.22–5.81)
RDW: 18.8 % — ABNORMAL HIGH (ref 11.5–15.5)
WBC: 14.7 10*3/uL — ABNORMAL HIGH (ref 4.0–10.5)
nRBC: 1.2 % — ABNORMAL HIGH (ref 0.0–0.2)

## 2020-02-26 MED ORDER — DARBEPOETIN ALFA 500 MCG/ML IJ SOSY
500.0000 ug | PREFILLED_SYRINGE | Freq: Once | INTRAMUSCULAR | Status: AC
  Administered 2020-02-26: 500 ug via SUBCUTANEOUS

## 2020-02-26 MED ORDER — DARBEPOETIN ALFA 500 MCG/ML IJ SOSY
PREFILLED_SYRINGE | INTRAMUSCULAR | Status: AC
  Filled 2020-02-26: qty 1

## 2020-02-26 NOTE — Telephone Encounter (Signed)
Scheduled appts per 9/14 los. Gave pt a print out of AVS.  
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
## 2020-02-27 ENCOUNTER — Ambulatory Visit: Payer: Medicare Other | Admitting: Oncology

## 2020-02-27 DIAGNOSIS — H353211 Exudative age-related macular degeneration, right eye, with active choroidal neovascularization: Secondary | ICD-10-CM | POA: Diagnosis not present

## 2020-03-18 ENCOUNTER — Ambulatory Visit: Payer: Medicare Other

## 2020-03-18 ENCOUNTER — Other Ambulatory Visit: Payer: Medicare Other

## 2020-03-25 ENCOUNTER — Other Ambulatory Visit: Payer: Self-pay

## 2020-03-25 ENCOUNTER — Inpatient Hospital Stay: Payer: Medicare Other | Attending: Oncology

## 2020-03-25 ENCOUNTER — Inpatient Hospital Stay: Payer: Medicare Other

## 2020-03-25 DIAGNOSIS — N189 Chronic kidney disease, unspecified: Secondary | ICD-10-CM | POA: Insufficient documentation

## 2020-03-25 DIAGNOSIS — D631 Anemia in chronic kidney disease: Secondary | ICD-10-CM | POA: Diagnosis not present

## 2020-03-25 DIAGNOSIS — Z7982 Long term (current) use of aspirin: Secondary | ICD-10-CM | POA: Diagnosis not present

## 2020-03-25 DIAGNOSIS — R161 Splenomegaly, not elsewhere classified: Secondary | ICD-10-CM

## 2020-03-25 DIAGNOSIS — N183 Chronic kidney disease, stage 3 unspecified: Secondary | ICD-10-CM

## 2020-03-25 DIAGNOSIS — D473 Essential (hemorrhagic) thrombocythemia: Secondary | ICD-10-CM

## 2020-03-25 LAB — CBC WITH DIFFERENTIAL/PLATELET
Abs Immature Granulocytes: 1.25 10*3/uL — ABNORMAL HIGH (ref 0.00–0.07)
Basophils Absolute: 0.2 10*3/uL — ABNORMAL HIGH (ref 0.0–0.1)
Basophils Relative: 1 %
Eosinophils Absolute: 0 10*3/uL (ref 0.0–0.5)
Eosinophils Relative: 0 %
HCT: 31.7 % — ABNORMAL LOW (ref 39.0–52.0)
Hemoglobin: 10.1 g/dL — ABNORMAL LOW (ref 13.0–17.0)
Immature Granulocytes: 9 %
Lymphocytes Relative: 14 %
Lymphs Abs: 2 10*3/uL (ref 0.7–4.0)
MCH: 35.4 pg — ABNORMAL HIGH (ref 26.0–34.0)
MCHC: 31.9 g/dL (ref 30.0–36.0)
MCV: 111.2 fL — ABNORMAL HIGH (ref 80.0–100.0)
Monocytes Absolute: 1.5 10*3/uL — ABNORMAL HIGH (ref 0.1–1.0)
Monocytes Relative: 11 %
Neutro Abs: 9.2 10*3/uL — ABNORMAL HIGH (ref 1.7–7.7)
Neutrophils Relative %: 65 %
Platelets: 385 10*3/uL (ref 150–400)
RBC: 2.85 MIL/uL — ABNORMAL LOW (ref 4.22–5.81)
RDW: 18.3 % — ABNORMAL HIGH (ref 11.5–15.5)
WBC: 14.1 10*3/uL — ABNORMAL HIGH (ref 4.0–10.5)
nRBC: 0.1 % (ref 0.0–0.2)

## 2020-03-25 LAB — CMP (CANCER CENTER ONLY)
ALT: 19 U/L (ref 0–44)
AST: 21 U/L (ref 15–41)
Albumin: 3.4 g/dL — ABNORMAL LOW (ref 3.5–5.0)
Alkaline Phosphatase: 90 U/L (ref 38–126)
Anion gap: 4 — ABNORMAL LOW (ref 5–15)
BUN: 30 mg/dL — ABNORMAL HIGH (ref 8–23)
CO2: 27 mmol/L (ref 22–32)
Calcium: 8.9 mg/dL (ref 8.9–10.3)
Chloride: 109 mmol/L (ref 98–111)
Creatinine: 1.59 mg/dL — ABNORMAL HIGH (ref 0.61–1.24)
GFR, Estimated: 38 mL/min — ABNORMAL LOW (ref >60)
Glucose, Bld: 101 mg/dL — ABNORMAL HIGH (ref 70–99)
Potassium: 4.3 mmol/L (ref 3.5–5.1)
Sodium: 140 mmol/L (ref 135–145)
Total Bilirubin: 0.5 mg/dL (ref 0.3–1.2)
Total Protein: 6.9 g/dL (ref 6.5–8.1)

## 2020-03-25 NOTE — Progress Notes (Signed)
Per parameters and Dr Jana Hakim, pt will not receive Aranesp today. Pt made aware and copy of labs provided.

## 2020-04-02 DIAGNOSIS — H353211 Exudative age-related macular degeneration, right eye, with active choroidal neovascularization: Secondary | ICD-10-CM | POA: Diagnosis not present

## 2020-04-22 ENCOUNTER — Inpatient Hospital Stay: Payer: Medicare Other

## 2020-04-22 ENCOUNTER — Inpatient Hospital Stay: Payer: Medicare Other | Attending: Oncology

## 2020-04-22 ENCOUNTER — Other Ambulatory Visit: Payer: Self-pay

## 2020-04-22 VITALS — BP 124/53 | HR 72 | Temp 98.7°F | Resp 18

## 2020-04-22 DIAGNOSIS — N183 Chronic kidney disease, stage 3 unspecified: Secondary | ICD-10-CM

## 2020-04-22 DIAGNOSIS — N1831 Chronic kidney disease, stage 3a: Secondary | ICD-10-CM

## 2020-04-22 DIAGNOSIS — D631 Anemia in chronic kidney disease: Secondary | ICD-10-CM | POA: Diagnosis not present

## 2020-04-22 DIAGNOSIS — R161 Splenomegaly, not elsewhere classified: Secondary | ICD-10-CM

## 2020-04-22 DIAGNOSIS — N189 Chronic kidney disease, unspecified: Secondary | ICD-10-CM | POA: Diagnosis not present

## 2020-04-22 DIAGNOSIS — D473 Essential (hemorrhagic) thrombocythemia: Secondary | ICD-10-CM

## 2020-04-22 LAB — CMP (CANCER CENTER ONLY)
ALT: 13 U/L (ref 0–44)
AST: 20 U/L (ref 15–41)
Albumin: 3.6 g/dL (ref 3.5–5.0)
Alkaline Phosphatase: 66 U/L (ref 38–126)
Anion gap: 6 (ref 5–15)
BUN: 34 mg/dL — ABNORMAL HIGH (ref 8–23)
CO2: 26 mmol/L (ref 22–32)
Calcium: 8.7 mg/dL — ABNORMAL LOW (ref 8.9–10.3)
Chloride: 109 mmol/L (ref 98–111)
Creatinine: 1.6 mg/dL — ABNORMAL HIGH (ref 0.61–1.24)
GFR, Estimated: 41 mL/min — ABNORMAL LOW (ref >60)
Glucose, Bld: 92 mg/dL (ref 70–99)
Potassium: 4.7 mmol/L (ref 3.5–5.1)
Sodium: 141 mmol/L (ref 135–145)
Total Bilirubin: 0.6 mg/dL (ref 0.3–1.2)
Total Protein: 6.8 g/dL (ref 6.5–8.1)

## 2020-04-22 LAB — CBC WITH DIFFERENTIAL/PLATELET
Abs Immature Granulocytes: 1.41 10*3/uL — ABNORMAL HIGH (ref 0.00–0.07)
Basophils Absolute: 0.2 10*3/uL — ABNORMAL HIGH (ref 0.0–0.1)
Basophils Relative: 1 %
Eosinophils Absolute: 0 10*3/uL (ref 0.0–0.5)
Eosinophils Relative: 0 %
HCT: 29.5 % — ABNORMAL LOW (ref 39.0–52.0)
Hemoglobin: 9.4 g/dL — ABNORMAL LOW (ref 13.0–17.0)
Immature Granulocytes: 10 %
Lymphocytes Relative: 18 %
Lymphs Abs: 2.5 10*3/uL (ref 0.7–4.0)
MCH: 35.1 pg — ABNORMAL HIGH (ref 26.0–34.0)
MCHC: 31.9 g/dL (ref 30.0–36.0)
MCV: 110.1 fL — ABNORMAL HIGH (ref 80.0–100.0)
Monocytes Absolute: 1.7 10*3/uL — ABNORMAL HIGH (ref 0.1–1.0)
Monocytes Relative: 12 %
Neutro Abs: 8.3 10*3/uL — ABNORMAL HIGH (ref 1.7–7.7)
Neutrophils Relative %: 59 %
Platelets: 320 10*3/uL (ref 150–400)
RBC: 2.68 MIL/uL — ABNORMAL LOW (ref 4.22–5.81)
RDW: 18.6 % — ABNORMAL HIGH (ref 11.5–15.5)
WBC: 14 10*3/uL — ABNORMAL HIGH (ref 4.0–10.5)
nRBC: 0.9 % — ABNORMAL HIGH (ref 0.0–0.2)

## 2020-04-22 MED ORDER — DARBEPOETIN ALFA 500 MCG/ML IJ SOSY
500.0000 ug | PREFILLED_SYRINGE | Freq: Once | INTRAMUSCULAR | Status: AC
  Administered 2020-04-22: 500 ug via SUBCUTANEOUS

## 2020-04-22 MED ORDER — DARBEPOETIN ALFA 500 MCG/ML IJ SOSY
PREFILLED_SYRINGE | INTRAMUSCULAR | Status: AC
  Filled 2020-04-22: qty 1

## 2020-05-12 DIAGNOSIS — H353211 Exudative age-related macular degeneration, right eye, with active choroidal neovascularization: Secondary | ICD-10-CM | POA: Diagnosis not present

## 2020-05-19 NOTE — Progress Notes (Signed)
[place]  Telephone:(336) [phone] Fax:(336) [phone]   ID: Nicholas [name].   DOB: 05-05-1928  MR#: [id number]  FMB#:[id number]  Patient Care Team: Denita Lung, MD as PCP - General (Family Medicine) Ayauna Mcnay, Virgie Dad, MD as Consulting Physician (Oncology) Tysinger, Camelia Eng, PA-C as Physician Assistant (Family Medicine)  OTHERS: Dr. Posey Pronto (eye doctor)   CHIEF COMPLAINT:  Essential thrombocytosis and anemia of chronic kidney disease  CURRENT TREATMENT: hydroxyurea; Aranesp   INTERVAL HISTORY: Nicholas [name] returns today for follow-up and treatment of his myeloproliferative disorder accompanied by his daughter.    He continues on hydroxyurea, 516m daily.  He tolerates that well with no symptoms that he is aware of.  His white cell count and platelet counts have been well controlled Lab Results  Component Value Date   PLT 353 05/20/2020   PLT 320 04/22/2020   PLT 385 03/25/2020   PLT 342 02/26/2020   PLT 307 01/29/2020   Lab Results  Component Value Date   WBC 18.8 (H) 05/20/2020   WBC 14.0 (H) 04/22/2020   WBC 14.1 (H) 03/25/2020   WBC 14.7 (H) 02/26/2020   WBC 12.7 (H) 01/29/2020   He is also receiving Aranesp, last received on 04/22/2020.  He tolerates this well.  He is due for another injection today.  Lab Results  Component Value Date   HGB 9.8 (L) 05/20/2020   HGB 9.4 (L) 04/22/2020   HGB 10.1 (L) 03/25/2020   HGB 9.6 (L) 02/26/2020   HGB 10.4 (L) 01/29/2020    REVIEW OF SYSTEMS: Nicholas [name] is more tired.  His hemoglobin is under 10.  The daughter tells me that whoever has been giving the shots recently has told him that they cannot give him a dose of darbepoetin if the hemoglobin is greater than 10.  The result is he missed the shots twice in the last 4 months and the result of that is that his hemoglobin is now inadequately corrected and he is more symptomatic.   COVID 19 VACCINATION STATUS: [facility] February 2021, no  booster as of May 20, 2020   PAST MEDICAL HISTORY: Past Medical History:  Diagnosis Date  . Abnormal CT scan, sinus 10/04/06   pansinusitis, mildly deviated septum  . Allergy   . Anemia    related to CKD  . Cancer ([facility]    melanoma skin cancer  . Chronic sinusitis   . Dysthymia    dysthymia  . H/O echocardiogram 01/04/2007   LV function normal, 55-60% EF, mildly increased thickenss of LV wall  . H/O echocardiogram 12/2006   mild LV wall thickening, EF 55-60%, LV function normal  . Headache     " sinus"  . History of MRI of brain and brain stem 02/26/06   sinusitis, nonspecific deep white matter changes normal for age  . Hypertension   . Macular degeneration    B/L  . Seborrheic keratosis    sees HToni Arthurs NP dermatology  . [place]rombocytosis ([place]    sees hematology    PAST SURGICAL HISTORY: Past Surgical History:  Procedure Laterality Date  . EYE SURGERY    . HERNIA REPAIR     right inguinal repair  . I & D EXTREMITY Right 09/16/2017   Procedure: IRRIGATION AND DEBRIDEMENT EXTREMITY;  Surgeon: GRoseanne Kaufman MD;  [facility]  Service: Orthopedics;  Laterality: Right;  . PARS PLANA VITRECTOMY Left 07/10/2019   Procedure: Pars Plana Vitrectomy With 25g Removal/Suture Intraocular Lens;  Surgeon: Jalene Mullet, MD;  Location: [facility];  Service: Ophthalmology;  Laterality: Left;  . TONSILLECTOMY     childhood  . VARICOSE VEIN SURGERY     right lower leg    FAMILY HISTORY Family History  Problem Relation Age of Onset  . Heart disease Father   . Stroke Father   . Cancer Neg Hx   . COPD Neg Hx   . Diabetes Neg Hx     SOCIAL HISTORY:  (Updated December 2020) The patient lives with his wife, who suffers from diabetes and emphysema and has had some small strokes. She has progressive dementia.  He gives her her insulin shots. He has 2 daughters, [name] who lives a mile away and is pretty much retired and International aid/development worker who lives near the [employer] and works  with Dr [employer]mond [employer] .The patient has 3 grandchildren.[name]   ADVANCED DIRECTIVES: the patient tells me his daughter Edrees Valent would make health care decisions if he is incapacitated.  At the 06/12/2019 visit the patient was given the appropriate documents to complete and notarized at his discretion.    HEALTH MAINTENANCE:  Social History   Tobacco Use  . Smoking status: Former Smoker    Quit date: 06/14/1958    Years since quitting: 61.9  . Smokeless tobacco: Never Used  Vaping Use  . Vaping Use: Never used  Substance Use Topics  . Alcohol use: No  . Drug use: No     Colonoscopy:  Not on file  PSA: Not on file  Bone density: Never  Lipid panel: October 2014  Allergies  Allergen Reactions  . Erythromycin Other (See Comments)    Gums turned red and bled profusely  . Tape Other (See Comments)    The patient's SKIN IS VERY THIN AND TEARS AND BRUISES VERY EASILY!!    Current Outpatient Medications  Medication Sig Dispense Refill  . acetaminophen (TYLENOL) 650 MG CR tablet Take 650 mg by mouth at bedtime.    [name] aspirin 81 MG tablet Take 1 tablet (81 mg total) by mouth daily. 90 tablet 3  . Calcium Carbonate-Vitamin D3 600-400 MG-UNIT TABS Take 1 tablet by mouth 2 (two) times daily. 60 tablet 2  . cetirizine (ZYRTEC) 10 MG tablet Take 10 mg by mouth at bedtime.    . CVS D3 25 MCG (1000 UT) capsule TAKE 1 CAPSULE BY MOUTH EVERY DAY (Patient taking differently: Take 1,000 Units by mouth daily. ) 120 capsule 3  . FLUoxetine (PROZAC) 20 MG capsule TAKE 1 CAPSULE BY MOUTH EVERY DAY 90 capsule 1  . hydroxyurea (HYDREA) 500 MG capsule TAKE 1 CAPSULE (500 MG TOTAL) BY MOUTH DAILY. MAY TAKE WITH FOOD TO MINIMIZE GI SIDE EFFECTS. 90 capsule 3  . saccharomyces boulardii (FLORASTOR) 250 MG capsule Take 1 capsule (250 mg total) by mouth 2 (two) times daily.    . sodium chloride (OCEAN) 0.65 % nasal spray Place 1 spray into the nose 4 (four) times daily as needed for congestion.     [name] terazosin  (HYTRIN) 2 MG capsule TAKE 1 CAPSULE (2 MG TOTAL) BY MOUTH AT BEDTIME. 90 capsule 3   Current Facility-Administered Medications  Medication Dose Route Frequency Provider Last Rate Last Admin  . Darbepoetin Alfa (ARANESP) injection 500 mcg  500 mcg Subcutaneous Once Beckie Viscardi, Virgie Dad, MD        OBJECTIVE: white male examined in a wheelchair Vitals:   05/20/20 1216  BP: (!) 145/59  Pulse: 76  Resp: 18  Temp: 98.9 F (37.2 C)  SpO2: 97%    Body mass index is 20.77 kg/m.  Filed Weights   05/20/20 1216  Weight: 161 lb 12.8 oz (73.4 kg)    ECOG FS:2 - Symptomatic, <50% confined to bed  Sclerae unicteric, EOMs intact Wearing a mask No cervical or supraclavicular adenopathy Lungs no rales or rhonchi Heart regular rate and rhythm Abd soft, nontender, positive bowel sounds MSK no focal spinal tenderness, no upper extremity lymphedema Neuro: nonfocal, well oriented, appropriate affect   LAB RESULTS:  CBC    Component Value Date/Time   WBC 18.8 (H) 05/20/2020 1158   RBC 2.84 (L) 05/20/2020 1158   HGB 9.8 (L) 05/20/2020 1158   HGB 10.0 (L) 03/20/2019 1355   HGB 11.4 (L) 02/23/2019 1529   HGB 8.7 (L) 06/03/2017 1404   HCT 31.1 (L) 05/20/2020 1158   HCT 32.8 (L) 02/23/2019 1529   HCT 27.6 (L) 06/03/2017 1404   PLT 353 05/20/2020 1158   PLT 350 03/20/2019 1355   PLT 346 02/23/2019 1529   MCV 109.5 (H) 05/20/2020 1158   MCV 102 (H) 02/23/2019 1529   MCV 103.8 (H) 06/03/2017 1404   MCH 34.5 (H) 05/20/2020 1158   MCHC 31.5 05/20/2020 1158   RDW 18.8 (H) 05/20/2020 1158   RDW 16.8 (H) 02/23/2019 1529   RDW 20.5 (H) 06/03/2017 1404   LYMPHSABS 2.2 05/20/2020 1158   LYMPHSABS 2.7 02/23/2019 1529   LYMPHSABS 2.7 06/03/2017 1404   MONOABS 2.1 (H) 05/20/2020 1158   MONOABS 2.2 (H) 06/03/2017 1404   EOSABS 0.0 05/20/2020 1158   EOSABS 0.0 02/23/2019 1529   BASOSABS 0.2 (H) 05/20/2020 1158   BASOSABS 0.4 (H) 02/23/2019 1529   BASOSABS 0.5 (H) 06/03/2017 1404       Chemistry      Component Value Date/Time   NA 141 04/22/2020 1341   NA 140 02/23/2019 1529   NA 142 06/03/2017 1404   K 4.7 04/22/2020 1341   K 3.9 06/03/2017 1404   CL 109 04/22/2020 1341   CO2 26 04/22/2020 1341   CO2 26 06/03/2017 1404   BUN 34 (H) 04/22/2020 1341   BUN 26 02/23/2019 1529   BUN 19.9 06/03/2017 1404   CREATININE 1.60 (H) 04/22/2020 1341   CREATININE 1.6 (H) 06/03/2017 1404      Component Value Date/Time   CALCIUM 8.7 (L) 04/22/2020 1341   CALCIUM 8.9 06/03/2017 1404   ALKPHOS 66 04/22/2020 1341   ALKPHOS 91 06/03/2017 1404   AST 20 04/22/2020 1341   AST 20 06/03/2017 1404   ALT 13 04/22/2020 1341   ALT 12 06/03/2017 1404   BILITOT 0.6 04/22/2020 1341   BILITOT 0.48 06/03/2017 1404      STUDIES: No results found.    ASSESSMENT: 86 y.o.  Nicholas [name] man with   (1) essential thrombocytosis initially diagnosed December 2003,  controlled on anagrelide 0.5 mg daily and aspirin 81 mg daily initially, stopped 06/2017  (a) no evidence of leukemic transformation  (b) Hydrea 531m daily starting in 07/2017  (2) anemia, secondary to chronic kidney disease, receiving aranesp   (a) dose increased to 500 mg every 4 weeks starting 09/04/2019  (3) concern regarding possible transition to myelofibrosis on bone marrow biopsy 06/22/2017,  (a) normal cytogenetics  (b) FISH WNL   PLAN: Nicholas [name] has declined slightly secondary to his anemia.  We need to correct the Ms. apprehension that he does not get shots if his hemoglobin is between 10 and 11  and I have sent a note to our billing Kyung Bacca) and nursing Stann Mainland).  Hopefully we can get this fixed for future reference.  I encouraged him to go ahead and get the booster.  He is bothered by multiple Robo calls and calls trying to scam him.  At least he is aware of the fact that he is not to pay any attention to any of that  Unfortunately I have no way of stopping that for him  There will see [place] again in a month.  They know  to call for any other issue that may develop before then  Total encounter time 25 minutes.Sarajane Jews C. Deshone Lyssy, MD 05/20/20 12:36 PM Medical Oncology and Hematology [facility] [place], [place] [address] Tel. [phone]    Fax. [phone]   I, Wilburn Mylar, am acting as scribe for Dr. Virgie Dad. Janea Schwenn.  I, Lurline Del MD, have reviewed the above documentation for accuracy and completeness, and I agree with the above.   *Total Encounter Time as defined by the Centers for Medicare and Medicaid Services includes, in addition to the face-to-face time of a patient visit (documented in the note above) non-face-to-face time: obtaining and reviewing outside history, ordering and reviewing medications, tests or procedures, care coordination (communications with other health care professionals or caregivers) and documentation in the medical record.

## 2020-05-20 ENCOUNTER — Inpatient Hospital Stay (HOSPITAL_BASED_OUTPATIENT_CLINIC_OR_DEPARTMENT_OTHER): Payer: Medicare Other | Admitting: Oncology

## 2020-05-20 ENCOUNTER — Inpatient Hospital Stay: Payer: Medicare Other

## 2020-05-20 ENCOUNTER — Other Ambulatory Visit: Payer: Self-pay

## 2020-05-20 ENCOUNTER — Inpatient Hospital Stay: Payer: Medicare Other | Attending: Oncology

## 2020-05-20 VITALS — BP 145/59 | HR 76 | Temp 98.9°F | Resp 18 | Ht 74.0 in | Wt 161.8 lb

## 2020-05-20 DIAGNOSIS — D473 Essential (hemorrhagic) thrombocythemia: Secondary | ICD-10-CM

## 2020-05-20 DIAGNOSIS — N1831 Chronic kidney disease, stage 3a: Secondary | ICD-10-CM

## 2020-05-20 DIAGNOSIS — N183 Chronic kidney disease, stage 3 unspecified: Secondary | ICD-10-CM | POA: Diagnosis not present

## 2020-05-20 DIAGNOSIS — D631 Anemia in chronic kidney disease: Secondary | ICD-10-CM | POA: Diagnosis not present

## 2020-05-20 DIAGNOSIS — N189 Chronic kidney disease, unspecified: Secondary | ICD-10-CM | POA: Insufficient documentation

## 2020-05-20 DIAGNOSIS — Z7189 Other specified counseling: Secondary | ICD-10-CM | POA: Diagnosis not present

## 2020-05-20 DIAGNOSIS — R161 Splenomegaly, not elsewhere classified: Secondary | ICD-10-CM

## 2020-05-20 LAB — CBC WITH DIFFERENTIAL/PLATELET
Abs Immature Granulocytes: 1.59 10*3/uL — ABNORMAL HIGH (ref 0.00–0.07)
Basophils Absolute: 0.2 10*3/uL — ABNORMAL HIGH (ref 0.0–0.1)
Basophils Relative: 1 %
Eosinophils Absolute: 0 10*3/uL (ref 0.0–0.5)
Eosinophils Relative: 0 %
HCT: 31.1 % — ABNORMAL LOW (ref 39.0–52.0)
Hemoglobin: 9.8 g/dL — ABNORMAL LOW (ref 13.0–17.0)
Immature Granulocytes: 9 %
Lymphocytes Relative: 12 %
Lymphs Abs: 2.2 10*3/uL (ref 0.7–4.0)
MCH: 34.5 pg — ABNORMAL HIGH (ref 26.0–34.0)
MCHC: 31.5 g/dL (ref 30.0–36.0)
MCV: 109.5 fL — ABNORMAL HIGH (ref 80.0–100.0)
Monocytes Absolute: 2.1 10*3/uL — ABNORMAL HIGH (ref 0.1–1.0)
Monocytes Relative: 11 %
Neutro Abs: 12.7 10*3/uL — ABNORMAL HIGH (ref 1.7–7.7)
Neutrophils Relative %: 67 %
Platelets: 353 10*3/uL (ref 150–400)
RBC: 2.84 MIL/uL — ABNORMAL LOW (ref 4.22–5.81)
RDW: 18.8 % — ABNORMAL HIGH (ref 11.5–15.5)
WBC: 18.8 10*3/uL — ABNORMAL HIGH (ref 4.0–10.5)
nRBC: 0.3 % — ABNORMAL HIGH (ref 0.0–0.2)

## 2020-05-20 MED ORDER — DARBEPOETIN ALFA 500 MCG/ML IJ SOSY
PREFILLED_SYRINGE | INTRAMUSCULAR | Status: AC
  Filled 2020-05-20: qty 1

## 2020-05-20 MED ORDER — DARBEPOETIN ALFA 500 MCG/ML IJ SOSY
500.0000 ug | PREFILLED_SYRINGE | Freq: Once | INTRAMUSCULAR | Status: AC
  Administered 2020-05-20: 500 ug via SUBCUTANEOUS

## 2020-05-22 ENCOUNTER — Telehealth: Payer: Self-pay | Admitting: Oncology

## 2020-05-22 NOTE — Telephone Encounter (Signed)
Scheduled appts per 12/7 los. Left voicemail with new arrival time.

## 2020-05-24 ENCOUNTER — Other Ambulatory Visit: Payer: Self-pay | Admitting: Medical

## 2020-05-26 NOTE — Telephone Encounter (Signed)
[facility] is requesting to fill pt terazosin.[name] Please advise due to nature of med. Nicholas [name]

## 2020-06-17 ENCOUNTER — Other Ambulatory Visit: Payer: Medicare Other

## 2020-06-17 ENCOUNTER — Ambulatory Visit: Payer: Medicare Other

## 2020-06-17 ENCOUNTER — Other Ambulatory Visit: Payer: Self-pay

## 2020-06-17 ENCOUNTER — Inpatient Hospital Stay: Payer: Medicare Other | Attending: Oncology

## 2020-06-17 ENCOUNTER — Inpatient Hospital Stay: Payer: Medicare Other

## 2020-06-17 ENCOUNTER — Telehealth: Payer: Self-pay | Admitting: Oncology

## 2020-06-17 ENCOUNTER — Inpatient Hospital Stay (HOSPITAL_BASED_OUTPATIENT_CLINIC_OR_DEPARTMENT_OTHER): Payer: Medicare Other | Admitting: Oncology

## 2020-06-17 VITALS — BP 148/62 | HR 69 | Temp 98.5°F | Resp 18 | Ht 74.0 in

## 2020-06-17 DIAGNOSIS — N189 Chronic kidney disease, unspecified: Secondary | ICD-10-CM | POA: Diagnosis not present

## 2020-06-17 DIAGNOSIS — D473 Essential (hemorrhagic) thrombocythemia: Secondary | ICD-10-CM | POA: Diagnosis not present

## 2020-06-17 DIAGNOSIS — Z7189 Other specified counseling: Secondary | ICD-10-CM

## 2020-06-17 DIAGNOSIS — N183 Chronic kidney disease, stage 3 unspecified: Secondary | ICD-10-CM

## 2020-06-17 DIAGNOSIS — N1831 Chronic kidney disease, stage 3a: Secondary | ICD-10-CM | POA: Diagnosis not present

## 2020-06-17 DIAGNOSIS — D631 Anemia in chronic kidney disease: Secondary | ICD-10-CM

## 2020-06-17 DIAGNOSIS — R161 Splenomegaly, not elsewhere classified: Secondary | ICD-10-CM

## 2020-06-17 LAB — CBC WITH DIFFERENTIAL/PLATELET
Abs Immature Granulocytes: 1.56 10*3/uL — ABNORMAL HIGH (ref 0.00–0.07)
Basophils Absolute: 0.2 10*3/uL — ABNORMAL HIGH (ref 0.0–0.1)
Basophils Relative: 1 %
Eosinophils Absolute: 0 10*3/uL (ref 0.0–0.5)
Eosinophils Relative: 0 %
HCT: 32.8 % — ABNORMAL LOW (ref 39.0–52.0)
Hemoglobin: 10 g/dL — ABNORMAL LOW (ref 13.0–17.0)
Immature Granulocytes: 9 %
Lymphocytes Relative: 12 %
Lymphs Abs: 2 10*3/uL (ref 0.7–4.0)
MCH: 33.9 pg (ref 26.0–34.0)
MCHC: 30.5 g/dL (ref 30.0–36.0)
MCV: 111.2 fL — ABNORMAL HIGH (ref 80.0–100.0)
Monocytes Absolute: 1.8 10*3/uL — ABNORMAL HIGH (ref 0.1–1.0)
Monocytes Relative: 11 %
Neutro Abs: 11.2 10*3/uL — ABNORMAL HIGH (ref 1.7–7.7)
Neutrophils Relative %: 67 %
Platelets: 373 10*3/uL (ref 150–400)
RBC: 2.95 MIL/uL — ABNORMAL LOW (ref 4.22–5.81)
RDW: 19 % — ABNORMAL HIGH (ref 11.5–15.5)
WBC: 16.8 10*3/uL — ABNORMAL HIGH (ref 4.0–10.5)
nRBC: 0.1 % (ref 0.0–0.2)

## 2020-06-17 LAB — SAVE SMEAR(SSMR), FOR PROVIDER SLIDE REVIEW

## 2020-06-17 MED ORDER — DARBEPOETIN ALFA 500 MCG/ML IJ SOSY
PREFILLED_SYRINGE | INTRAMUSCULAR | Status: AC
  Filled 2020-06-17: qty 1

## 2020-06-17 MED ORDER — DARBEPOETIN ALFA 500 MCG/ML IJ SOSY
500.0000 ug | PREFILLED_SYRINGE | Freq: Once | INTRAMUSCULAR | Status: AC
  Administered 2020-06-17: 500 ug via SUBCUTANEOUS

## 2020-06-17 NOTE — Progress Notes (Signed)
[place]  Telephone:(336) [phone] Fax:(336) [phone]   ID: Stormy Fabian.   DOB: May 02, 1928  MR#: [id number]  OMA#:[id number]  Patient Care Team: Denita Lung, MD as PCP - General (Family Medicine) Hadyn Blanck, Virgie Dad, MD as Consulting Physician (Oncology) Tysinger, Camelia Eng, PA-C as Physician Assistant (Family Medicine)  OTHERS: Dr. Posey Pronto (eye doctor)   CHIEF COMPLAINT:  Essential thrombocytosis and anemia of chronic kidney disease  CURRENT TREATMENT: hydroxyurea; Aranesp   INTERVAL HISTORY: Neng returns today for follow-up and treatment of his myeloproliferative disorder accompanied by his daughter [name].    He continues on hydroxyurea, 516m daily.  He tolerates that well with no symptoms that he is aware of.  He tells me he obtains it for less than $10 a month.  His white cell count and platelet counts have been well controlled:  Lab Results  Component Value Date   PLT 373 06/17/2020   PLT 353 05/20/2020   PLT 320 04/22/2020   PLT 385 03/25/2020   PLT 342 02/26/2020   Lab Results  Component Value Date   WBC 16.8 (H) 06/17/2020   WBC 18.8 (H) 05/20/2020   WBC 14.0 (H) 04/22/2020   WBC 14.1 (H) 03/25/2020   WBC 14.7 (H) 02/26/2020   He is also receiving Aranesp, last received on 05/20/2020.  He tolerates this well.  He is due for another injection today.  Lab Results  Component Value Date   HGB 10.0 (L) 06/17/2020   HGB 9.8 (L) 05/20/2020   HGB 9.4 (L) 04/22/2020   HGB 10.1 (L) 03/25/2020   HGB 9.6 (L) 02/26/2020    REVIEW OF SYSTEMS: Barney was getting out of the car late at night yesterday evening after the rain and slipped and fell, with an abrasion on the anterior left shin area.  He did not even mention that to his daughter--she found out as we asked about falls.  He has had no other falls and clearly after the rain and snow yesterday I do not think this indicates balance issue.  He tells me his bowel movements are slow.  They are a bit  hard.  He is not on stool softeners at this point.  Aside from these issues a detailed review of systems today was stable   COVID 19 VACCINATION STATUS: [place] post [place], most recently February 2021, no booster as of May 20, 2020   PAST MEDICAL HISTORY: Past Medical History:  Diagnosis Date  . Abnormal CT scan, sinus 10/04/06   pansinusitis, mildly deviated septum  . Allergy   . Anemia    related to CKD  . [place]    melanoma skin cancer  . Chronic sinusitis   . Dysthymia    dysthymia  . H/O echocardiogram 01/04/2007   LV function normal, 55-60% EF, mildly increased thickenss of LV wall  . H/O echocardiogram 12/2006   mild LV wall thickening, EF 55-60%, LV function normal  . Headache     " sinus"  . History of MRI of brain and brain stem 02/26/06   sinusitis, nonspecific deep white matter changes normal for age  . Hypertension   . Macular degeneration    B/L  . Seborrheic keratosis    sees HToni Arthurs NP dermatology  . [place]    sees hematology    PAST SURGICAL HISTORY: Past Surgical History:  Procedure Laterality Date  . EYE SURGERY    . HERNIA REPAIR     right inguinal repair  .  I & D EXTREMITY Right 09/16/2017   Procedure: IRRIGATION AND DEBRIDEMENT EXTREMITY;  Surgeon: Roseanne Kaufman, MD;  Location: [facility];  Service: Orthopedics;  Laterality: Right;  . PARS PLANA VITRECTOMY Left 07/10/2019   Procedure: Pars Plana Vitrectomy With 25g Removal/Suture Intraocular Lens;  Surgeon: Jalene Mullet, MD;  Location: [facility];  Service: Ophthalmology;  Laterality: Left;  . TONSILLECTOMY     childhood  . VARICOSE VEIN SURGERY     right lower leg    FAMILY HISTORY Family History  Problem Relation Age of Onset  . Heart disease Father   . Stroke Father   . Cancer Neg Hx   . COPD Neg Hx   . Diabetes Neg Hx     SOCIAL HISTORY:  (Updated December 2020) The patient lives with his wife, who suffers from diabetes and emphysema and has had some small  strokes. She has progressive dementia.  He gives her her insulin shots. He has 2 daughters, [name] who lives a mile away and is pretty much retired and International aid/development worker who lives near the [employer] and works with Dr [employer] .The patient has 3 grandchildren.[name]   ADVANCED DIRECTIVES: the patient tells me his daughter Thayer Embleton would make health care decisions if he is incapacitated.  At the 06/12/2019 visit the patient was given the appropriate documents to complete and notarized at his discretion.    HEALTH MAINTENANCE:  Social History   Tobacco Use  . Smoking status: Former Smoker    Quit date: 06/14/1958    Years since quitting: 62.0  . Smokeless tobacco: Never Used  Vaping Use  . Vaping Use: Never used  Substance Use Topics  . Alcohol use: No  . Drug use: No     Colonoscopy:  Not on file  PSA: Not on file  Bone density: Never  Lipid panel: October 2014  Allergies  Allergen Reactions  . Erythromycin Other (See Comments)    Gums turned red and bled profusely  . Tape Other (See Comments)    The patient's SKIN IS VERY THIN AND TEARS AND BRUISES VERY EASILY!!    Current Outpatient Medications  Medication Sig Dispense Refill  . acetaminophen (TYLENOL) 650 MG CR tablet Take 650 mg by mouth at bedtime.    [name] aspirin EC 81 MG tablet Take 1 tablet (81 mg total) by mouth every other day. Swallow whole. 90 tablet 11  . Calcium Carbonate-Vitamin D3 600-400 MG-UNIT TABS Take 1 tablet by mouth 2 (two) times daily. 60 tablet 2  . cetirizine (ZYRTEC) 10 MG tablet Take 10 mg by mouth at bedtime.    . [employer] D3 25 MCG (1000 UT) capsule TAKE 1 CAPSULE BY MOUTH EVERY DAY (Patient taking differently: Take 1,000 Units by mouth daily. ) 120 capsule 3  . FLUoxetine (PROZAC) 20 MG capsule TAKE 1 CAPSULE BY MOUTH EVERY DAY 90 capsule 1  . hydroxyurea (HYDREA) 500 MG capsule TAKE 1 CAPSULE (500 MG TOTAL) BY MOUTH DAILY. MAY TAKE WITH FOOD TO MINIMIZE GI SIDE EFFECTS. 90 capsule 3  . saccharomyces  boulardii (FLORASTOR) 250 MG capsule Take 1 capsule (250 mg total) by mouth 2 (two) times daily.    . sodium chloride (OCEAN) 0.65 % nasal spray Place 1 spray into the nose 4 (four) times daily as needed for congestion.     [name] terazosin (HYTRIN) 2 MG capsule TAKE 1 CAPSULE (2 MG TOTAL) BY MOUTH AT BEDTIME. 90 capsule 3   No current facility-administered medications for this visit.  OBJECTIVE: white male examined in a wheelchair Vitals:   06/17/20 1441  BP: (!) 148/62  Pulse: 69  Resp: 18  Temp: 98.5 F (36.9 C)  SpO2: 97%    Body mass index is 20.77 kg/m.  There were no vitals filed for this visit.  ECOG FS:2 - Symptomatic, <50% confined to bed  Sclerae unicteric, EOMs intact Wearing a mask No cervical or supraclavicular adenopathy Lungs no rales or rhonchi Heart regular rate and rhythm Abd soft, nontender, positive bowel sounds MSK the mid left lower extremity is bandaged over.  The bandages clean and dry.  There is no apparent erythema and there is no swelling distally.  There is no tenderness to mild palpation Neuro: nonfocal, well oriented, appropriate affect   LAB RESULTS:  CBC    Component Value Date/Time   WBC 16.8 (H) 06/17/2020 1428   RBC 2.95 (L) 06/17/2020 1428   HGB 10.0 (L) 06/17/2020 1428   HGB 10.0 (L) 03/20/2019 1355   HGB 11.4 (L) 02/23/2019 1529   HGB 8.7 (L) 06/03/2017 1404   HCT 32.8 (L) 06/17/2020 1428   HCT 32.8 (L) 02/23/2019 1529   HCT 27.6 (L) 06/03/2017 1404   PLT 373 06/17/2020 1428   PLT 350 03/20/2019 1355   PLT 346 02/23/2019 1529   MCV 111.2 (H) 06/17/2020 1428   MCV 102 (H) 02/23/2019 1529   MCV 103.8 (H) 06/03/2017 1404   MCH 33.9 06/17/2020 1428   MCHC 30.5 06/17/2020 1428   RDW 19.0 (H) 06/17/2020 1428   RDW 16.8 (H) 02/23/2019 1529   RDW 20.5 (H) 06/03/2017 1404   LYMPHSABS 2.0 06/17/2020 1428   LYMPHSABS 2.7 02/23/2019 1529   LYMPHSABS 2.7 06/03/2017 1404   MONOABS 1.8 (H) 06/17/2020 1428   MONOABS 2.2 (H) 06/03/2017  1404   EOSABS 0.0 06/17/2020 1428   EOSABS 0.0 02/23/2019 1529   BASOSABS 0.2 (H) 06/17/2020 1428   BASOSABS 0.4 (H) 02/23/2019 1529   BASOSABS 0.5 (H) 06/03/2017 1404      Chemistry      Component Value Date/Time   NA 141 04/22/2020 1341   NA 140 02/23/2019 1529   NA 142 06/03/2017 1404   K 4.7 04/22/2020 1341   K 3.9 06/03/2017 1404   CL 109 04/22/2020 1341   CO2 26 04/22/2020 1341   CO2 26 06/03/2017 1404   BUN 34 (H) 04/22/2020 1341   BUN 26 02/23/2019 1529   BUN 19.9 06/03/2017 1404   CREATININE 1.60 (H) 04/22/2020 1341   CREATININE 1.6 (H) 06/03/2017 1404      Component Value Date/Time   CALCIUM 8.7 (L) 04/22/2020 1341   CALCIUM 8.9 06/03/2017 1404   ALKPHOS 66 04/22/2020 1341   ALKPHOS 91 06/03/2017 1404   AST 20 04/22/2020 1341   AST 20 06/03/2017 1404   ALT 13 04/22/2020 1341   ALT 12 06/03/2017 1404   BILITOT 0.6 04/22/2020 1341   BILITOT 0.48 06/03/2017 1404      STUDIES: No results found.    ASSESSMENT: 87 y.o.  [place] man with   (1) essential thrombocytosis initially diagnosed December 2003,  controlled on anagrelide 0.5 mg daily and aspirin 81 mg daily initially, stopped 06/2017  (a) no evidence of leukemic transformation  (b) Hydrea 560m daily starting in 07/2017  (2) anemia, secondary to chronic kidney disease, receiving aranesp   (a) dose increased to 500 mcg every 4 weeks starting 09/04/2019  (3) concern regarding possible transition to myelofibrosis on bone marrow biopsy 06/22/2017,  (  a) normal cytogenetics  (b) FISH WNL   PLAN: Savyon does better with a hemoglobin between 10 and 11.  He is maintaining that with the current dose of Aranesp.  The plan is to continue this every 4 weeks.  His platelet count is well controlled on Hydrea which she tolerates well.  The white cell count is not clearly trending up.  Accordingly I am not making any changes in his treatment.  He will continue Aranesp every 4 weeks.  He will see me in 12  weeks.  They know to call for any other issue that may develop before the next visit  Total encounter time 25 minutes.Sarajane Jews C. Edker Punt, MD 06/17/20 6:09 PM Medical Oncology and Hematology [facility] [place], [place] [address] Tel. [phone]    Fax. [phone]   I, Wilburn Mylar, am acting as scribe for Dr. Virgie Dad. Alissa Pharr.  I, Lurline Del MD, have reviewed the above documentation for accuracy and completeness, and I agree with the above.   *Total Encounter Time as defined by the Centers for Medicare and Medicaid Services includes, in addition to the face-to-face time of a patient visit (documented in the note above) non-face-to-face time: obtaining and reviewing outside history, ordering and reviewing medications, tests or procedures, care coordination (communications with other health care professionals or caregivers) and documentation in the medical record.

## 2020-06-17 NOTE — Telephone Encounter (Signed)
Scheduled appts per 1/4 los. Gave pt's daughter a print out of AVS and appt calendar.

## 2020-06-18 NOTE — Progress Notes (Signed)
I have not seen him in over a year.  Please talk to [name] and get him on the schedule for a med check and Medicare wellness visit

## 2020-06-25 DIAGNOSIS — H353211 Exudative age-related macular degeneration, right eye, with active choroidal neovascularization: Secondary | ICD-10-CM | POA: Diagnosis not present

## 2020-07-15 ENCOUNTER — Other Ambulatory Visit: Payer: Self-pay | Admitting: *Deleted

## 2020-07-15 ENCOUNTER — Other Ambulatory Visit: Payer: Self-pay

## 2020-07-15 ENCOUNTER — Inpatient Hospital Stay: Payer: Medicare Other

## 2020-07-15 ENCOUNTER — Inpatient Hospital Stay: Payer: Medicare Other | Attending: Oncology

## 2020-07-15 ENCOUNTER — Other Ambulatory Visit: Payer: Self-pay | Admitting: Oncology

## 2020-07-15 VITALS — BP 109/45 | HR 75 | Temp 98.0°F

## 2020-07-15 DIAGNOSIS — N189 Chronic kidney disease, unspecified: Secondary | ICD-10-CM | POA: Diagnosis not present

## 2020-07-15 DIAGNOSIS — N183 Chronic kidney disease, stage 3 unspecified: Secondary | ICD-10-CM

## 2020-07-15 DIAGNOSIS — D473 Essential (hemorrhagic) thrombocythemia: Secondary | ICD-10-CM

## 2020-07-15 DIAGNOSIS — Z7189 Other specified counseling: Secondary | ICD-10-CM

## 2020-07-15 DIAGNOSIS — D631 Anemia in chronic kidney disease: Secondary | ICD-10-CM

## 2020-07-15 DIAGNOSIS — N1831 Chronic kidney disease, stage 3a: Secondary | ICD-10-CM

## 2020-07-15 LAB — CMP (CANCER CENTER ONLY)
ALT: 15 U/L (ref 0–44)
AST: 22 U/L (ref 15–41)
Albumin: 3.4 g/dL — ABNORMAL LOW (ref 3.5–5.0)
Alkaline Phosphatase: 80 U/L (ref 38–126)
Anion gap: 8 (ref 5–15)
BUN: 31 mg/dL — ABNORMAL HIGH (ref 8–23)
CO2: 25 mmol/L (ref 22–32)
Calcium: 8.5 mg/dL — ABNORMAL LOW (ref 8.9–10.3)
Chloride: 111 mmol/L (ref 98–111)
Creatinine: 1.79 mg/dL — ABNORMAL HIGH (ref 0.61–1.24)
GFR, Estimated: 36 mL/min — ABNORMAL LOW (ref >60)
Glucose, Bld: 107 mg/dL — ABNORMAL HIGH (ref 70–99)
Potassium: 4.5 mmol/L (ref 3.5–5.1)
Sodium: 144 mmol/L (ref 135–145)
Total Bilirubin: 0.5 mg/dL (ref 0.3–1.2)
Total Protein: 6.8 g/dL (ref 6.5–8.1)

## 2020-07-15 LAB — CBC WITH DIFFERENTIAL (CANCER CENTER ONLY)
Abs Immature Granulocytes: 1.38 10*3/uL — ABNORMAL HIGH (ref 0.00–0.07)
Basophils Absolute: 0.3 10*3/uL — ABNORMAL HIGH (ref 0.0–0.1)
Basophils Relative: 2 %
Eosinophils Absolute: 0 10*3/uL (ref 0.0–0.5)
Eosinophils Relative: 0 %
HCT: 31.6 % — ABNORMAL LOW (ref 39.0–52.0)
Hemoglobin: 10 g/dL — ABNORMAL LOW (ref 13.0–17.0)
Immature Granulocytes: 7 %
Lymphocytes Relative: 11 %
Lymphs Abs: 2.1 10*3/uL (ref 0.7–4.0)
MCH: 34.7 pg — ABNORMAL HIGH (ref 26.0–34.0)
MCHC: 31.6 g/dL (ref 30.0–36.0)
MCV: 109.7 fL — ABNORMAL HIGH (ref 80.0–100.0)
Monocytes Absolute: 1.7 10*3/uL — ABNORMAL HIGH (ref 0.1–1.0)
Monocytes Relative: 9 %
Neutro Abs: 13.7 10*3/uL — ABNORMAL HIGH (ref 1.7–7.7)
Neutrophils Relative %: 71 %
Platelet Count: 453 10*3/uL — ABNORMAL HIGH (ref 150–400)
RBC: 2.88 MIL/uL — ABNORMAL LOW (ref 4.22–5.81)
RDW: 18.4 % — ABNORMAL HIGH (ref 11.5–15.5)
WBC Count: 19.2 10*3/uL — ABNORMAL HIGH (ref 4.0–10.5)
nRBC: 0.3 % — ABNORMAL HIGH (ref 0.0–0.2)

## 2020-07-15 LAB — SAVE SMEAR(SSMR), FOR PROVIDER SLIDE REVIEW

## 2020-07-15 MED ORDER — DARBEPOETIN ALFA 500 MCG/ML IJ SOSY
500.0000 ug | PREFILLED_SYRINGE | Freq: Once | INTRAMUSCULAR | Status: AC
  Administered 2020-07-15: 500 ug via SUBCUTANEOUS

## 2020-07-15 MED ORDER — HYDROXYUREA 500 MG PO CAPS: 500.0000 mg | ORAL_CAPSULE | Freq: Every day | ORAL | 3 refills | Status: AC

## 2020-07-15 MED ORDER — DARBEPOETIN ALFA 500 MCG/ML IJ SOSY
PREFILLED_SYRINGE | INTRAMUSCULAR | Status: AC
  Filled 2020-07-15: qty 1

## 2020-07-15 NOTE — Progress Notes (Signed)
Mr. Nicholas [name] white cell count and platelets are a bit up. I suspect he has been missing some Hydrea doses. I discussed this with his daughter who tells me that she does not monitor his medications (she does monitor her mother's medications). She will check with him and see if he has the medication and is taking it. I also went ahead and refilled it for him.

## 2020-08-12 ENCOUNTER — Inpatient Hospital Stay: Payer: Medicare Other

## 2020-08-12 DIAGNOSIS — H353211 Exudative age-related macular degeneration, right eye, with active choroidal neovascularization: Secondary | ICD-10-CM | POA: Diagnosis not present

## 2020-08-19 ENCOUNTER — Ambulatory Visit: Payer: Medicare Other

## 2020-08-19 ENCOUNTER — Other Ambulatory Visit: Payer: Medicare Other

## 2020-08-23 ENCOUNTER — Other Ambulatory Visit: Payer: Self-pay | Admitting: Family Medicine

## 2020-08-25 NOTE — Telephone Encounter (Signed)
Ok to refill 

## 2020-08-26 ENCOUNTER — Inpatient Hospital Stay: Payer: Medicare Other | Attending: Oncology

## 2020-08-26 ENCOUNTER — Other Ambulatory Visit: Payer: Self-pay

## 2020-08-26 ENCOUNTER — Inpatient Hospital Stay: Payer: Medicare Other

## 2020-08-26 VITALS — BP 115/54 | HR 73 | Temp 98.6°F | Resp 18

## 2020-08-26 DIAGNOSIS — D631 Anemia in chronic kidney disease: Secondary | ICD-10-CM | POA: Diagnosis not present

## 2020-08-26 DIAGNOSIS — N189 Chronic kidney disease, unspecified: Secondary | ICD-10-CM | POA: Insufficient documentation

## 2020-08-26 DIAGNOSIS — N183 Chronic kidney disease, stage 3 unspecified: Secondary | ICD-10-CM

## 2020-08-26 DIAGNOSIS — Z7189 Other specified counseling: Secondary | ICD-10-CM

## 2020-08-26 DIAGNOSIS — N1831 Chronic kidney disease, stage 3a: Secondary | ICD-10-CM

## 2020-08-26 DIAGNOSIS — D473 Essential (hemorrhagic) thrombocythemia: Secondary | ICD-10-CM

## 2020-08-26 LAB — CBC WITH DIFFERENTIAL (CANCER CENTER ONLY)
Abs Immature Granulocytes: 1 10*3/uL — ABNORMAL HIGH (ref 0.00–0.07)
Band Neutrophils: 5 %
Basophils Absolute: 0 10*3/uL (ref 0.0–0.1)
Basophils Relative: 0 %
Eosinophils Absolute: 0 10*3/uL (ref 0.0–0.5)
Eosinophils Relative: 0 %
HCT: 29.1 % — ABNORMAL LOW (ref 39.0–52.0)
Hemoglobin: 9.1 g/dL — ABNORMAL LOW (ref 13.0–17.0)
Lymphocytes Relative: 12 %
Lymphs Abs: 2 10*3/uL (ref 0.7–4.0)
MCH: 34.2 pg — ABNORMAL HIGH (ref 26.0–34.0)
MCHC: 31.3 g/dL (ref 30.0–36.0)
MCV: 109.4 fL — ABNORMAL HIGH (ref 80.0–100.0)
Metamyelocytes Relative: 4 %
Monocytes Absolute: 1.7 10*3/uL — ABNORMAL HIGH (ref 0.1–1.0)
Monocytes Relative: 10 %
Myelocytes: 2 %
Neutro Abs: 11.9 10*3/uL — ABNORMAL HIGH (ref 1.7–7.7)
Neutrophils Relative %: 67 %
Platelet Count: 360 10*3/uL (ref 150–400)
RBC: 2.66 MIL/uL — ABNORMAL LOW (ref 4.22–5.81)
RDW: 18.9 % — ABNORMAL HIGH (ref 11.5–15.5)
WBC Count: 16.5 10*3/uL — ABNORMAL HIGH (ref 4.0–10.5)
nRBC: 0.5 % — ABNORMAL HIGH (ref 0.0–0.2)

## 2020-08-26 LAB — CMP (CANCER CENTER ONLY)
ALT: 16 U/L (ref 0–44)
AST: 24 U/L (ref 15–41)
Albumin: 3.5 g/dL (ref 3.5–5.0)
Alkaline Phosphatase: 78 U/L (ref 38–126)
Anion gap: 9 (ref 5–15)
BUN: 35 mg/dL — ABNORMAL HIGH (ref 8–23)
CO2: 23 mmol/L (ref 22–32)
Calcium: 8.5 mg/dL — ABNORMAL LOW (ref 8.9–10.3)
Chloride: 112 mmol/L — ABNORMAL HIGH (ref 98–111)
Creatinine: 1.51 mg/dL — ABNORMAL HIGH (ref 0.61–1.24)
GFR, Estimated: 44 mL/min — ABNORMAL LOW (ref >60)
Glucose, Bld: 102 mg/dL — ABNORMAL HIGH (ref 70–99)
Potassium: 4.7 mmol/L (ref 3.5–5.1)
Sodium: 144 mmol/L (ref 135–145)
Total Bilirubin: 0.4 mg/dL (ref 0.3–1.2)
Total Protein: 6.8 g/dL (ref 6.5–8.1)

## 2020-08-26 LAB — SAVE SMEAR(SSMR), FOR PROVIDER SLIDE REVIEW

## 2020-08-26 MED ORDER — DARBEPOETIN ALFA 500 MCG/ML IJ SOSY
500.0000 ug | PREFILLED_SYRINGE | Freq: Once | INTRAMUSCULAR | Status: AC
  Administered 2020-08-26: 500 ug via SUBCUTANEOUS

## 2020-08-26 MED ORDER — DARBEPOETIN ALFA 500 MCG/ML IJ SOSY
PREFILLED_SYRINGE | INTRAMUSCULAR | Status: AC
  Filled 2020-08-26: qty 1

## 2020-09-09 ENCOUNTER — Ambulatory Visit: Payer: Medicare Other

## 2020-09-09 ENCOUNTER — Other Ambulatory Visit: Payer: Medicare Other

## 2020-10-06 NOTE — Progress Notes (Signed)
[facility]  Telephone:(336) [phone] Fax:(336) [phone]   ID: Nicholas [name].   DOB: [birth year]  MR#: [id number]  ENI#:[id number]  Patient Care Team: Denita Lung, MD as PCP - General (Family Medicine) Timesha Cervantez, Virgie Dad, MD as Consulting Physician (Oncology) Tysinger, Camelia Eng, PA-C as Physician Assistant (Family Medicine)  OTHERS: Dr. Posey Pronto (eye doctor)   CHIEF COMPLAINT:  Essential thrombocytosis and anemia of chronic kidney disease  CURRENT TREATMENT: hydroxyurea; Aranesp   INTERVAL HISTORY: Marco returns today for follow-up and treatment of his myeloproliferative disorder accompanied by his daughter [name].    There have been some issues in the family with the patient's wife deteriorating physically and mentally and the patient's second daughter having to be admitted for Crohn's disease flare and requiring significant surgery and colostomy placement.  Because of these issues the patient's Aranesp has only been given every 6 weeks for the last 2 doses.  He continues on hydroxyurea, 540m daily.  He tolerates that well with no symptoms that he is aware of.  He tells me he obtains it for less than $10 a month.  His white cell count and platelet counts have been well controlled:  Lab Results  Component Value Date   PLT 326 10/07/2020   PLT 360 08/26/2020   PLT 453 (H) 07/15/2020   PLT 373 06/17/2020   PLT 353 05/20/2020   Lab Results  Component Value Date   WBC 17.9 (H) 10/07/2020   WBC 16.5 (H) 08/26/2020   WBC 19.2 (H) 07/15/2020   WBC 16.8 (H) 06/17/2020   WBC 18.8 (H) 05/20/2020   He is also receiving Aranesp, last received on 08/26/2020.  He tolerates this well.  He is due for another injection today.  Lab Results  Component Value Date   HGB 9.9 (L) 10/07/2020   HGB 9.1 (L) 08/26/2020   HGB 10.0 (L) 07/15/2020   HGB 10.0 (L) 06/17/2020   HGB 9.8 (L) 05/20/2020    REVIEW OF SYSTEMS: Carlas has lost some weight.  He is very anxious about both  his wife and his younger daughter.  He appears more frail.  The daughter present today is trying to get him and his wife into an assisted living situation at [facility].   COVID 19 VACCINATION STATUS: [place] post [place] [facility] most recently February 2021, no booster as of May 20, 2020   PAST MEDICAL HISTORY: Past Medical History:  Diagnosis Date  . Abnormal CT scan, sinus 10/04/06   pansinusitis, mildly deviated septum  . Allergy   . Anemia    related to CKD  . [place]    melanoma skin cancer  . Chronic sinusitis   . Dysthymia    dysthymia  . H/O echocardiogram 01/04/2007   LV function normal, 55-60% EF, mildly increased thickenss of LV wall  . H/O echocardiogram 12/2006   mild LV wall thickening, EF 55-60%, LV function normal  . Headache     " sinus"  . History of MRI of brain and brain stem 02/26/06   sinusitis, nonspecific deep white matter changes normal for age  . Hypertension   . Macular degeneration    B/L  . Seborrheic keratosis    sees HToni Arthurs NP dermatology  . [place]rombocytosis ([place]    sees hematology    PAST SURGICAL HISTORY: Past Surgical History:  Procedure Laterality Date  . EYE SURGERY    . HERNIA REPAIR     right inguinal repair  . I & D  EXTREMITY Right 09/16/2017   Procedure: IRRIGATION AND DEBRIDEMENT EXTREMITY;  Surgeon: Roseanne Kaufman, MD;  Location: [facility];  Service: Orthopedics;  Laterality: Right;  . PARS PLANA VITRECTOMY Left 07/10/2019   Procedure: Pars Plana Vitrectomy With 25g Removal/Suture Intraocular Lens;  Surgeon: Jalene Mullet, MD;  Location: [facility];  Service: Ophthalmology;  Laterality: Left;  . TONSILLECTOMY     childhood  . VARICOSE VEIN SURGERY     right lower leg    FAMILY HISTORY Family History  Problem Relation Age of Onset  . Heart disease Father   . Stroke Father   . Cancer Neg Hx   . COPD Neg Hx   . Diabetes Neg Hx     SOCIAL HISTORY:  (Updated December 2020) The patient lives with his wife, who suffers  from diabetes and emphysema and has had some small strokes. She has progressive dementia.  He gives her her insulin shots. He has 2 daughters, [name] who lives a mile away and is pretty much retired and International aid/development worker who lives near the [employer] and works with Dr [employer] .The patient has 3 grandchildren.[name]   ADVANCED DIRECTIVES: the patient tells me his daughter Martavis Gurney would make health care decisions if he is incapacitated.  At the 06/12/2019 visit the patient was given the appropriate documents to complete and notarized at his discretion.    HEALTH MAINTENANCE:  Social History   Tobacco Use  . Smoking status: Former Smoker    Quit date: 06/14/1958    Years since quitting: 62.3  . Smokeless tobacco: Never Used  Vaping Use  . Vaping Use: Never used  Substance Use Topics  . Alcohol use: No  . Drug use: No     Colonoscopy:  Not on file  PSA: Not on file  Bone density: Never  Lipid panel: October 2014  Allergies  Allergen Reactions  . Erythromycin Other (See Comments)    Gums turned red and bled profusely  . Tape Other (See Comments)    The patient's SKIN IS VERY THIN AND TEARS AND BRUISES VERY EASILY!!    Current Outpatient Medications  Medication Sig Dispense Refill  . acetaminophen (TYLENOL) 650 MG CR tablet Take 650 mg by mouth at bedtime.    [name] aspirin EC 81 MG tablet Take 1 tablet (81 mg total) by mouth every other day. Swallow whole. 90 tablet 11  . Calcium Carbonate-Vitamin D3 600-400 MG-UNIT TABS Take 1 tablet by mouth 2 (two) times daily. 60 tablet 2  . cetirizine (ZYRTEC) 10 MG tablet Take 10 mg by mouth at bedtime.    . CVS D3 25 MCG (1000 UT) capsule TAKE 1 CAPSULE BY MOUTH EVERY DAY (Patient taking differently: Take 1,000 Units by mouth daily. ) 120 capsule 3  . FLUoxetine (PROZAC) 20 MG capsule TAKE 1 CAPSULE BY MOUTH EVERY DAY 90 capsule 1  . hydroxyurea (HYDREA) 500 MG capsule Take 1 capsule (500 mg total) by mouth daily. May take with food to minimize GI  side effects. 90 capsule 3  . saccharomyces boulardii (FLORASTOR) 250 MG capsule Take 1 capsule (250 mg total) by mouth 2 (two) times daily.    . sodium chloride (OCEAN) 0.65 % nasal spray Place 1 spray into the nose 4 (four) times daily as needed for congestion.     [name] terazosin (HYTRIN) 2 MG capsule TAKE 1 CAPSULE (2 MG TOTAL) BY MOUTH AT BEDTIME. 90 capsule 3   No current facility-administered medications for this visit.    OBJECTIVE:  white man who appears frail Vitals:   10/07/20 1443  BP: (!) 107/57  Pulse: 78  Resp: 18  Temp: 97.7 F (36.5 C)  SpO2: 96%    Body mass index is 20.03 kg/m.  Filed Weights   10/07/20 1443  Weight: 156 lb (70.8 kg)    ECOG FS:2 - Symptomatic, <50% confined to bed  Sclerae unicteric, EOMs intact Wearing a mask No cervical or supraclavicular adenopathy Lungs no rales or rhonchi Heart regular rate and rhythm Abd soft, nontender Neuro: nonfocal, well oriented   LAB RESULTS:  CBC    Component Value Date/Time   WBC 17.9 (H) 10/07/2020 1414   WBC 16.8 (H) 06/17/2020 1428   RBC 2.97 (L) 10/07/2020 1414   HGB 9.9 (L) 10/07/2020 1414   HGB 11.4 (L) 02/23/2019 1529   HGB 8.7 (L) 06/03/2017 1404   HCT 31.5 (L) 10/07/2020 1414   HCT 32.8 (L) 02/23/2019 1529   HCT 27.6 (L) 06/03/2017 1404   PLT 326 10/07/2020 1414   PLT 346 02/23/2019 1529   MCV 106.1 (H) 10/07/2020 1414   MCV 102 (H) 02/23/2019 1529   MCV 103.8 (H) 06/03/2017 1404   MCH 33.3 10/07/2020 1414   MCHC 31.4 10/07/2020 1414   RDW 18.6 (H) 10/07/2020 1414   RDW 16.8 (H) 02/23/2019 1529   RDW 20.5 (H) 06/03/2017 1404   LYMPHSABS 2.4 10/07/2020 1414   LYMPHSABS 2.7 02/23/2019 1529   LYMPHSABS 2.7 06/03/2017 1404   MONOABS 2.3 (H) 10/07/2020 1414   MONOABS 2.2 (H) 06/03/2017 1404   EOSABS 0.0 10/07/2020 1414   EOSABS 0.0 02/23/2019 1529   BASOSABS 0.2 (H) 10/07/2020 1414   BASOSABS 0.4 (H) 02/23/2019 1529   BASOSABS 0.5 (H) 06/03/2017 1404      Chemistry       Component Value Date/Time   NA 144 10/07/2020 1414   NA 140 02/23/2019 1529   NA 142 06/03/2017 1404   K 4.5 10/07/2020 1414   K 3.9 06/03/2017 1404   CL 109 10/07/2020 1414   CO2 25 10/07/2020 1414   CO2 26 06/03/2017 1404   BUN 32 (H) 10/07/2020 1414   BUN 26 02/23/2019 1529   BUN 19.9 06/03/2017 1404   CREATININE 1.63 (H) 10/07/2020 1414   CREATININE 1.6 (H) 06/03/2017 1404      Component Value Date/Time   CALCIUM 8.7 (L) 10/07/2020 1414   CALCIUM 8.9 06/03/2017 1404   ALKPHOS 74 10/07/2020 1414   ALKPHOS 91 06/03/2017 1404   AST 20 10/07/2020 1414   AST 20 06/03/2017 1404   ALT 11 10/07/2020 1414   ALT 12 06/03/2017 1404   BILITOT 0.5 10/07/2020 1414   BILITOT 0.48 06/03/2017 1404      STUDIES: No results found.    ASSESSMENT: 87 y.o.  [place] man with   (1) essential thrombocytosis initially diagnosed December 2003,  controlled on anagrelide 0.5 mg daily and aspirin 81 mg daily initially, stopped 06/2017  (a) no evidence of leukemic transformation  (b) Hydrea 536m daily starting in 07/2017  (2) anemia, secondary to chronic kidney disease, receiving aranesp   (a) dose increased to 500 mcg every 4 weeks starting 09/04/2019  (b) changed to every 6 weeks as of May 2022  (3) concern regarding possible transition to myelofibrosis on bone marrow biopsy 06/22/2017,  (a) normal cytogenetics  (b) FISH WNL   PLAN: Coner had some delay in his Aranesp dosing because of family issues described above.  Despite that his hemoglobin today is  10.  It would be more convenient if he could be treated every 6 weeks instead of every 4 weeks particularly through this very stressful time for the family.  Accordingly I have changed his treatments to every 6weeks beginning with with his made treatment.  He is continuing Hydrea at the current dose which is holding his white cell count and platelet count at an acceptable level.  He received his Aranesp shot in our office instead  of in the treatment area at patient's preference  Total encounter time 25 minutes.Sarajane Jews C. Rayven Hendrickson, MD 10/08/20 11:58 AM Medical Oncology and Hematology [facility] [place],  [address] Tel. [phone]    Fax. [phone]   I, Wilburn Mylar, am acting as scribe for Dr. Virgie Dad. Cassandr Cederberg.  I, Lurline Del MD, have reviewed the above documentation for accuracy and completeness, and I agree with the above.   *Total Encounter Time as defined by the Centers for Medicare and Medicaid Services includes, in addition to the face-to-face time of a patient visit (documented in the note above) non-face-to-face time: obtaining and reviewing outside history, ordering and reviewing medications, tests or procedures, care coordination (communications with other health care professionals or caregivers) and documentation in the medical record.

## 2020-10-07 ENCOUNTER — Inpatient Hospital Stay: Payer: Medicare Other | Attending: Oncology

## 2020-10-07 ENCOUNTER — Ambulatory Visit: Payer: Medicare Other

## 2020-10-07 ENCOUNTER — Inpatient Hospital Stay: Payer: Medicare Other

## 2020-10-07 ENCOUNTER — Other Ambulatory Visit: Payer: Self-pay

## 2020-10-07 ENCOUNTER — Inpatient Hospital Stay (HOSPITAL_BASED_OUTPATIENT_CLINIC_OR_DEPARTMENT_OTHER): Payer: Medicare Other | Admitting: Oncology

## 2020-10-07 ENCOUNTER — Other Ambulatory Visit: Payer: Medicare Other

## 2020-10-07 VITALS — BP 107/57 | HR 78 | Temp 97.7°F | Resp 18 | Ht 74.0 in | Wt 156.0 lb

## 2020-10-07 DIAGNOSIS — D473 Essential (hemorrhagic) thrombocythemia: Secondary | ICD-10-CM

## 2020-10-07 DIAGNOSIS — N189 Chronic kidney disease, unspecified: Secondary | ICD-10-CM | POA: Insufficient documentation

## 2020-10-07 DIAGNOSIS — D631 Anemia in chronic kidney disease: Secondary | ICD-10-CM | POA: Diagnosis not present

## 2020-10-07 DIAGNOSIS — N183 Chronic kidney disease, stage 3 unspecified: Secondary | ICD-10-CM

## 2020-10-07 DIAGNOSIS — N1831 Chronic kidney disease, stage 3a: Secondary | ICD-10-CM

## 2020-10-07 DIAGNOSIS — Z7189 Other specified counseling: Secondary | ICD-10-CM

## 2020-10-07 LAB — CBC WITH DIFFERENTIAL (CANCER CENTER ONLY)
Abs Immature Granulocytes: 2.22 10*3/uL — ABNORMAL HIGH (ref 0.00–0.07)
Basophils Absolute: 0.2 10*3/uL — ABNORMAL HIGH (ref 0.0–0.1)
Basophils Relative: 1 %
Eosinophils Absolute: 0 10*3/uL (ref 0.0–0.5)
Eosinophils Relative: 0 %
HCT: 31.5 % — ABNORMAL LOW (ref 39.0–52.0)
Hemoglobin: 9.9 g/dL — ABNORMAL LOW (ref 13.0–17.0)
Immature Granulocytes: 12 %
Lymphocytes Relative: 13 %
Lymphs Abs: 2.4 10*3/uL (ref 0.7–4.0)
MCH: 33.3 pg (ref 26.0–34.0)
MCHC: 31.4 g/dL (ref 30.0–36.0)
MCV: 106.1 fL — ABNORMAL HIGH (ref 80.0–100.0)
Monocytes Absolute: 2.3 10*3/uL — ABNORMAL HIGH (ref 0.1–1.0)
Monocytes Relative: 13 %
Neutro Abs: 10.8 10*3/uL — ABNORMAL HIGH (ref 1.7–7.7)
Neutrophils Relative %: 61 %
Platelet Count: 326 10*3/uL (ref 150–400)
RBC: 2.97 MIL/uL — ABNORMAL LOW (ref 4.22–5.81)
RDW: 18.6 % — ABNORMAL HIGH (ref 11.5–15.5)
WBC Count: 17.9 10*3/uL — ABNORMAL HIGH (ref 4.0–10.5)
nRBC: 0.4 % — ABNORMAL HIGH (ref 0.0–0.2)

## 2020-10-07 LAB — SAVE SMEAR(SSMR), FOR PROVIDER SLIDE REVIEW

## 2020-10-07 LAB — CMP (CANCER CENTER ONLY)
ALT: 11 U/L (ref 0–44)
AST: 20 U/L (ref 15–41)
Albumin: 3.5 g/dL (ref 3.5–5.0)
Alkaline Phosphatase: 74 U/L (ref 38–126)
Anion gap: 10 (ref 5–15)
BUN: 32 mg/dL — ABNORMAL HIGH (ref 8–23)
CO2: 25 mmol/L (ref 22–32)
Calcium: 8.7 mg/dL — ABNORMAL LOW (ref 8.9–10.3)
Chloride: 109 mmol/L (ref 98–111)
Creatinine: 1.63 mg/dL — ABNORMAL HIGH (ref 0.61–1.24)
GFR, Estimated: 40 mL/min — ABNORMAL LOW (ref >60)
Glucose, Bld: 101 mg/dL — ABNORMAL HIGH (ref 70–99)
Potassium: 4.5 mmol/L (ref 3.5–5.1)
Sodium: 144 mmol/L (ref 135–145)
Total Bilirubin: 0.5 mg/dL (ref 0.3–1.2)
Total Protein: 6.9 g/dL (ref 6.5–8.1)

## 2020-10-07 MED ORDER — DARBEPOETIN ALFA 500 MCG/ML IJ SOSY
PREFILLED_SYRINGE | INTRAMUSCULAR | Status: AC
  Filled 2020-10-07: qty 1

## 2020-10-07 MED ORDER — DARBEPOETIN ALFA 500 MCG/ML IJ SOSY
500.0000 ug | PREFILLED_SYRINGE | Freq: Once | INTRAMUSCULAR | Status: AC
  Administered 2020-10-07: 500 ug via SUBCUTANEOUS

## 2020-10-10 ENCOUNTER — Telehealth: Payer: Self-pay | Admitting: Oncology

## 2020-10-10 NOTE — Telephone Encounter (Signed)
Scheduled per 4/26 los. Pt will receive an updated appt calendar

## 2020-11-04 ENCOUNTER — Inpatient Hospital Stay: Payer: Medicare Other

## 2020-11-04 ENCOUNTER — Inpatient Hospital Stay: Payer: Medicare Other | Attending: Oncology

## 2020-11-04 ENCOUNTER — Other Ambulatory Visit: Payer: Self-pay

## 2020-11-04 VITALS — BP 129/60 | HR 75 | Temp 98.2°F | Resp 16

## 2020-11-04 DIAGNOSIS — D631 Anemia in chronic kidney disease: Secondary | ICD-10-CM | POA: Insufficient documentation

## 2020-11-04 DIAGNOSIS — N183 Chronic kidney disease, stage 3 unspecified: Secondary | ICD-10-CM

## 2020-11-04 DIAGNOSIS — N189 Chronic kidney disease, unspecified: Secondary | ICD-10-CM | POA: Insufficient documentation

## 2020-11-04 DIAGNOSIS — N1831 Chronic kidney disease, stage 3a: Secondary | ICD-10-CM

## 2020-11-04 DIAGNOSIS — Z7189 Other specified counseling: Secondary | ICD-10-CM

## 2020-11-04 DIAGNOSIS — D473 Essential (hemorrhagic) thrombocythemia: Secondary | ICD-10-CM

## 2020-11-04 LAB — CBC WITH DIFFERENTIAL (CANCER CENTER ONLY)
Abs Immature Granulocytes: 2.56 10*3/uL — ABNORMAL HIGH (ref 0.00–0.07)
Basophils Absolute: 0.4 10*3/uL — ABNORMAL HIGH (ref 0.0–0.1)
Basophils Relative: 2 %
Eosinophils Absolute: 0 10*3/uL (ref 0.0–0.5)
Eosinophils Relative: 0 %
HCT: 33.1 % — ABNORMAL LOW (ref 39.0–52.0)
Hemoglobin: 10.6 g/dL — ABNORMAL LOW (ref 13.0–17.0)
Immature Granulocytes: 14 %
Lymphocytes Relative: 14 %
Lymphs Abs: 2.5 10*3/uL (ref 0.7–4.0)
MCH: 34.3 pg — ABNORMAL HIGH (ref 26.0–34.0)
MCHC: 32 g/dL (ref 30.0–36.0)
MCV: 107.1 fL — ABNORMAL HIGH (ref 80.0–100.0)
Monocytes Absolute: 2.4 10*3/uL — ABNORMAL HIGH (ref 0.1–1.0)
Monocytes Relative: 13 %
Neutro Abs: 10.6 10*3/uL — ABNORMAL HIGH (ref 1.7–7.7)
Neutrophils Relative %: 57 %
Platelet Count: 419 10*3/uL — ABNORMAL HIGH (ref 150–400)
RBC: 3.09 MIL/uL — ABNORMAL LOW (ref 4.22–5.81)
RDW: 19.9 % — ABNORMAL HIGH (ref 11.5–15.5)
WBC Count: 18.4 10*3/uL — ABNORMAL HIGH (ref 4.0–10.5)
nRBC: 0.2 % (ref 0.0–0.2)

## 2020-11-04 LAB — CMP (CANCER CENTER ONLY)
ALT: 12 U/L (ref 0–44)
AST: 23 U/L (ref 15–41)
Albumin: 3.5 g/dL (ref 3.5–5.0)
Alkaline Phosphatase: 73 U/L (ref 38–126)
Anion gap: 8 (ref 5–15)
BUN: 32 mg/dL — ABNORMAL HIGH (ref 8–23)
CO2: 24 mmol/L (ref 22–32)
Calcium: 8.8 mg/dL — ABNORMAL LOW (ref 8.9–10.3)
Chloride: 109 mmol/L (ref 98–111)
Creatinine: 1.35 mg/dL — ABNORMAL HIGH (ref 0.61–1.24)
GFR, Estimated: 50 mL/min — ABNORMAL LOW (ref >60)
Glucose, Bld: 91 mg/dL (ref 70–99)
Potassium: 4.5 mmol/L (ref 3.5–5.1)
Sodium: 141 mmol/L (ref 135–145)
Total Bilirubin: 0.5 mg/dL (ref 0.3–1.2)
Total Protein: 7 g/dL (ref 6.5–8.1)

## 2020-11-04 LAB — SAVE SMEAR(SSMR), FOR PROVIDER SLIDE REVIEW

## 2020-11-04 MED ORDER — DARBEPOETIN ALFA 500 MCG/ML IJ SOSY
500.0000 ug | PREFILLED_SYRINGE | Freq: Once | INTRAMUSCULAR | Status: AC
  Administered 2020-11-04: 500 ug via SUBCUTANEOUS

## 2020-11-04 MED ORDER — DARBEPOETIN ALFA 500 MCG/ML IJ SOSY
PREFILLED_SYRINGE | INTRAMUSCULAR | Status: AC
  Filled 2020-11-04: qty 1

## 2020-11-04 NOTE — Patient Instructions (Signed)
Darbepoetin Alfa injection What is this medicine? DARBEPOETIN ALFA (dar be POE e tin AL fa) helps your body make more red blood cells. It is used to treat anemia caused by chronic kidney failure and chemotherapy. This medicine may be used for other purposes; ask your health care provider or pharmacist if you have questions. COMMON BRAND NAME(S): Aranesp What should I tell my health care provider before I take this medicine? They need to know if you have any of these conditions:  blood clotting disorders or history of blood clots  cancer patient not on chemotherapy  cystic fibrosis  heart disease, such as angina, heart failure, or a history of a heart attack  hemoglobin level of 12 g/dL or greater  high blood pressure  low levels of folate, iron, or vitamin B12  seizures  an unusual or allergic reaction to darbepoetin, erythropoietin, albumin, hamster proteins, latex, other medicines, foods, dyes, or preservatives  pregnant or trying to get pregnant  breast-feeding How should I use this medicine? This medicine is for injection into a vein or under the skin. It is usually given by a health care professional in a hospital or clinic setting. If you get this medicine at home, you will be taught how to prepare and give this medicine. Use exactly as directed. Take your medicine at regular intervals. Do not take your medicine more often than directed. It is important that you put your used needles and syringes in a special sharps container. Do not put them in a trash can. If you do not have a sharps container, call your pharmacist or healthcare provider to get one. A special MedGuide will be given to you by the pharmacist with each prescription and refill. Be sure to read this information carefully each time. Talk to your pediatrician regarding the use of this medicine in children. While this medicine may be used in children as young as 1 month of age for selected conditions, precautions do  apply. Overdosage: If you think you have taken too much of this medicine contact a poison control center or emergency room at once. NOTE: This medicine is only for you. Do not share this medicine with others. What if I miss a dose? If you miss a dose, take it as soon as you can. If it is almost time for your next dose, take only that dose. Do not take double or extra doses. What may interact with this medicine? Do not take this medicine with any of the following medications:  epoetin alfa This list may not describe all possible interactions. Give your health care provider a list of all the medicines, herbs, non-prescription drugs, or dietary supplements you use. Also tell them if you smoke, drink alcohol, or use illegal drugs. Some items may interact with your medicine. What should I watch for while using this medicine? Your condition will be monitored carefully while you are receiving this medicine. You may need blood work done while you are taking this medicine. This medicine may cause a decrease in vitamin B6. You should make sure that you get enough vitamin B6 while you are taking this medicine. Discuss the foods you eat and the vitamins you take with your health care professional. What side effects may I notice from receiving this medicine? Side effects that you should report to your doctor or health care professional as soon as possible:  allergic reactions like skin rash, itching or hives, swelling of the face, lips, or tongue  breathing problems  changes in   vision  chest pain  confusion, trouble speaking or understanding  feeling faint or lightheaded, falls  high blood pressure  muscle aches or pains  pain, swelling, warmth in the leg  rapid weight gain  severe headaches  sudden numbness or weakness of the face, arm or leg  trouble walking, dizziness, loss of balance or coordination  seizures (convulsions)  swelling of the ankles, feet, hands  unusually weak or  tired Side effects that usually do not require medical attention (report to your doctor or health care professional if they continue or are bothersome):  diarrhea  fever, chills (flu-like symptoms)  headaches  nausea, vomiting  redness, stinging, or swelling at site where injected This list may not describe all possible side effects. Call your doctor for medical advice about side effects. You may report side effects to FDA at 1-800-FDA-1088. Where should I keep my medicine? Keep out of the reach of children. Store in a refrigerator between 2 and 8 degrees C (36 and 46 degrees F). Do not freeze. Do not shake. Throw away any unused portion if using a single-dose vial. Throw away any unused medicine after the expiration date. NOTE: This sheet is a summary. It may not cover all possible information. If you have questions about this medicine, talk to your doctor, pharmacist, or health care provider.  2021 Elsevier/Gold Standard (2017-06-15 16:44:20)  

## 2020-11-04 NOTE — Progress Notes (Signed)
Ok to proceed with Aranesp today per MD. Will move to Q 6 weeks going forward.  Raul Del [place], [place], BCPS, BCOP 11/04/2020 2:42 PM

## 2020-12-10 DIAGNOSIS — H353211 Exudative age-related macular degeneration, right eye, with active choroidal neovascularization: Secondary | ICD-10-CM | POA: Diagnosis not present

## 2020-12-16 ENCOUNTER — Inpatient Hospital Stay: Payer: Medicare Other | Attending: Oncology

## 2020-12-16 ENCOUNTER — Inpatient Hospital Stay: Payer: Medicare Other

## 2020-12-16 ENCOUNTER — Other Ambulatory Visit: Payer: Self-pay

## 2020-12-16 VITALS — BP 116/50 | HR 71 | Temp 99.0°F | Resp 18

## 2020-12-16 DIAGNOSIS — N189 Chronic kidney disease, unspecified: Secondary | ICD-10-CM | POA: Diagnosis not present

## 2020-12-16 DIAGNOSIS — D631 Anemia in chronic kidney disease: Secondary | ICD-10-CM | POA: Diagnosis not present

## 2020-12-16 DIAGNOSIS — N1831 Chronic kidney disease, stage 3a: Secondary | ICD-10-CM

## 2020-12-16 DIAGNOSIS — N183 Chronic kidney disease, stage 3 unspecified: Secondary | ICD-10-CM

## 2020-12-16 DIAGNOSIS — Z7189 Other specified counseling: Secondary | ICD-10-CM

## 2020-12-16 DIAGNOSIS — D473 Essential (hemorrhagic) thrombocythemia: Secondary | ICD-10-CM

## 2020-12-16 LAB — CBC WITH DIFFERENTIAL (CANCER CENTER ONLY)
Abs Immature Granulocytes: 1.1 10*3/uL — ABNORMAL HIGH (ref 0.00–0.07)
Band Neutrophils: 2 %
Basophils Absolute: 0 10*3/uL (ref 0.0–0.1)
Basophils Relative: 0 %
Eosinophils Absolute: 0 10*3/uL (ref 0.0–0.5)
Eosinophils Relative: 0 %
HCT: 29.4 % — ABNORMAL LOW (ref 39.0–52.0)
Hemoglobin: 9.5 g/dL — ABNORMAL LOW (ref 13.0–17.0)
Lymphocytes Relative: 9 %
Lymphs Abs: 1.4 10*3/uL (ref 0.7–4.0)
MCH: 34.8 pg — ABNORMAL HIGH (ref 26.0–34.0)
MCHC: 32.3 g/dL (ref 30.0–36.0)
MCV: 107.7 fL — ABNORMAL HIGH (ref 80.0–100.0)
Metamyelocytes Relative: 2 %
Monocytes Absolute: 1.4 10*3/uL — ABNORMAL HIGH (ref 0.1–1.0)
Monocytes Relative: 9 %
Myelocytes: 5 %
Neutro Abs: 11.6 10*3/uL — ABNORMAL HIGH (ref 1.7–7.7)
Neutrophils Relative %: 73 %
Platelet Count: 401 10*3/uL — ABNORMAL HIGH (ref 150–400)
RBC: 2.73 MIL/uL — ABNORMAL LOW (ref 4.22–5.81)
RDW: 20.4 % — ABNORMAL HIGH (ref 11.5–15.5)
WBC Count: 15.4 10*3/uL — ABNORMAL HIGH (ref 4.0–10.5)
nRBC: 0.3 % — ABNORMAL HIGH (ref 0.0–0.2)

## 2020-12-16 LAB — CMP (CANCER CENTER ONLY)
ALT: 14 U/L (ref 0–44)
AST: 20 U/L (ref 15–41)
Albumin: 3.2 g/dL — ABNORMAL LOW (ref 3.5–5.0)
Alkaline Phosphatase: 83 U/L (ref 38–126)
Anion gap: 9 (ref 5–15)
BUN: 34 mg/dL — ABNORMAL HIGH (ref 8–23)
CO2: 24 mmol/L (ref 22–32)
Calcium: 8.6 mg/dL — ABNORMAL LOW (ref 8.9–10.3)
Chloride: 109 mmol/L (ref 98–111)
Creatinine: 1.5 mg/dL — ABNORMAL HIGH (ref 0.61–1.24)
GFR, Estimated: 44 mL/min — ABNORMAL LOW (ref >60)
Glucose, Bld: 110 mg/dL — ABNORMAL HIGH (ref 70–99)
Potassium: 4.6 mmol/L (ref 3.5–5.1)
Sodium: 142 mmol/L (ref 135–145)
Total Bilirubin: 0.4 mg/dL (ref 0.3–1.2)
Total Protein: 6.8 g/dL (ref 6.5–8.1)

## 2020-12-16 LAB — SAVE SMEAR(SSMR), FOR PROVIDER SLIDE REVIEW

## 2020-12-16 MED ORDER — DARBEPOETIN ALFA 500 MCG/ML IJ SOSY
PREFILLED_SYRINGE | INTRAMUSCULAR | Status: AC
  Filled 2020-12-16: qty 1

## 2020-12-16 MED ORDER — DARBEPOETIN ALFA 500 MCG/ML IJ SOSY
500.0000 ug | PREFILLED_SYRINGE | Freq: Once | INTRAMUSCULAR | Status: AC
  Administered 2020-12-16: 500 ug via SUBCUTANEOUS

## 2020-12-16 NOTE — Addendum Note (Signed)
Addended by: Laureen Abrahams on: 12/16/2020 02:15 PM   Modules accepted: Orders

## 2021-01-27 ENCOUNTER — Other Ambulatory Visit: Payer: Self-pay

## 2021-01-27 ENCOUNTER — Inpatient Hospital Stay: Payer: Medicare Other

## 2021-01-27 ENCOUNTER — Inpatient Hospital Stay: Payer: Medicare Other | Attending: Oncology

## 2021-01-27 VITALS — BP 117/55 | HR 75 | Temp 97.0°F | Resp 18

## 2021-01-27 DIAGNOSIS — N183 Chronic kidney disease, stage 3 unspecified: Secondary | ICD-10-CM

## 2021-01-27 DIAGNOSIS — N1831 Chronic kidney disease, stage 3a: Secondary | ICD-10-CM

## 2021-01-27 DIAGNOSIS — N189 Chronic kidney disease, unspecified: Secondary | ICD-10-CM | POA: Diagnosis not present

## 2021-01-27 DIAGNOSIS — Z7189 Other specified counseling: Secondary | ICD-10-CM

## 2021-01-27 DIAGNOSIS — D631 Anemia in chronic kidney disease: Secondary | ICD-10-CM | POA: Insufficient documentation

## 2021-01-27 DIAGNOSIS — D473 Essential (hemorrhagic) thrombocythemia: Secondary | ICD-10-CM

## 2021-01-27 LAB — CBC WITH DIFFERENTIAL (CANCER CENTER ONLY)
Abs Immature Granulocytes: 1.85 10*3/uL — ABNORMAL HIGH (ref 0.00–0.07)
Basophils Absolute: 0.2 10*3/uL — ABNORMAL HIGH (ref 0.0–0.1)
Basophils Relative: 1 %
Eosinophils Absolute: 0 10*3/uL (ref 0.0–0.5)
Eosinophils Relative: 0 %
HCT: 30.3 % — ABNORMAL LOW (ref 39.0–52.0)
Hemoglobin: 9.6 g/dL — ABNORMAL LOW (ref 13.0–17.0)
Immature Granulocytes: 12 %
Lymphocytes Relative: 17 %
Lymphs Abs: 2.7 10*3/uL (ref 0.7–4.0)
MCH: 33.7 pg (ref 26.0–34.0)
MCHC: 31.7 g/dL (ref 30.0–36.0)
MCV: 106.3 fL — ABNORMAL HIGH (ref 80.0–100.0)
Monocytes Absolute: 2.2 10*3/uL — ABNORMAL HIGH (ref 0.1–1.0)
Monocytes Relative: 13 %
Neutro Abs: 9.2 10*3/uL — ABNORMAL HIGH (ref 1.7–7.7)
Neutrophils Relative %: 57 %
Platelet Count: 270 10*3/uL (ref 150–400)
RBC: 2.85 MIL/uL — ABNORMAL LOW (ref 4.22–5.81)
RDW: 19.5 % — ABNORMAL HIGH (ref 11.5–15.5)
WBC Count: 16.1 10*3/uL — ABNORMAL HIGH (ref 4.0–10.5)
nRBC: 0.7 % — ABNORMAL HIGH (ref 0.0–0.2)

## 2021-01-27 LAB — CMP (CANCER CENTER ONLY)
ALT: 15 U/L (ref 0–44)
AST: 20 U/L (ref 15–41)
Albumin: 3.5 g/dL (ref 3.5–5.0)
Alkaline Phosphatase: 80 U/L (ref 38–126)
Anion gap: 7 (ref 5–15)
BUN: 31 mg/dL — ABNORMAL HIGH (ref 8–23)
CO2: 25 mmol/L (ref 22–32)
Calcium: 8.8 mg/dL — ABNORMAL LOW (ref 8.9–10.3)
Chloride: 109 mmol/L (ref 98–111)
Creatinine: 1.45 mg/dL — ABNORMAL HIGH (ref 0.61–1.24)
GFR, Estimated: 46 mL/min — ABNORMAL LOW (ref >60)
Glucose, Bld: 92 mg/dL (ref 70–99)
Potassium: 4.7 mmol/L (ref 3.5–5.1)
Sodium: 141 mmol/L (ref 135–145)
Total Bilirubin: 0.5 mg/dL (ref 0.3–1.2)
Total Protein: 6.9 g/dL (ref 6.5–8.1)

## 2021-01-27 LAB — SAVE SMEAR(SSMR), FOR PROVIDER SLIDE REVIEW

## 2021-01-27 MED ORDER — DARBEPOETIN ALFA 500 MCG/ML IJ SOSY
500.0000 ug | PREFILLED_SYRINGE | Freq: Once | INTRAMUSCULAR | Status: AC
  Administered 2021-01-27: 500 ug via SUBCUTANEOUS
  Filled 2021-01-27: qty 1

## 2021-02-25 ENCOUNTER — Other Ambulatory Visit: Payer: Self-pay | Admitting: Family Medicine

## 2021-02-25 NOTE — Telephone Encounter (Signed)
Is this okay to refill? 

## 2021-03-10 ENCOUNTER — Ambulatory Visit: Payer: Medicare Other | Admitting: Oncology

## 2021-03-10 ENCOUNTER — Other Ambulatory Visit: Payer: Medicare Other

## 2021-03-10 ENCOUNTER — Inpatient Hospital Stay: Payer: Medicare Other

## 2021-03-17 ENCOUNTER — Other Ambulatory Visit: Payer: Medicare Other

## 2021-03-17 ENCOUNTER — Ambulatory Visit: Payer: Medicare Other

## 2021-03-17 ENCOUNTER — Ambulatory Visit: Payer: Medicare Other | Admitting: Oncology
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
## 2021-04-21 ENCOUNTER — Inpatient Hospital Stay (HOSPITAL_BASED_OUTPATIENT_CLINIC_OR_DEPARTMENT_OTHER): Payer: Medicare Other | Admitting: Oncology

## 2021-04-21 ENCOUNTER — Other Ambulatory Visit: Payer: Self-pay

## 2021-04-21 ENCOUNTER — Inpatient Hospital Stay: Payer: Medicare Other

## 2021-04-21 ENCOUNTER — Other Ambulatory Visit: Payer: Self-pay | Admitting: *Deleted

## 2021-04-21 ENCOUNTER — Telehealth: Payer: Self-pay | Admitting: *Deleted

## 2021-04-21 ENCOUNTER — Inpatient Hospital Stay: Payer: Medicare Other | Attending: Oncology

## 2021-04-21 VITALS — BP 115/53 | HR 74 | Resp 17

## 2021-04-21 VITALS — BP 104/47 | HR 75 | Temp 98.1°F | Resp 16

## 2021-04-21 DIAGNOSIS — N1832 Chronic kidney disease, stage 3b: Secondary | ICD-10-CM | POA: Insufficient documentation

## 2021-04-21 DIAGNOSIS — N183 Chronic kidney disease, stage 3 unspecified: Secondary | ICD-10-CM

## 2021-04-21 DIAGNOSIS — N1831 Chronic kidney disease, stage 3a: Secondary | ICD-10-CM

## 2021-04-21 DIAGNOSIS — D473 Essential (hemorrhagic) thrombocythemia: Secondary | ICD-10-CM

## 2021-04-21 DIAGNOSIS — E860 Dehydration: Secondary | ICD-10-CM

## 2021-04-21 DIAGNOSIS — D631 Anemia in chronic kidney disease: Secondary | ICD-10-CM | POA: Insufficient documentation

## 2021-04-21 LAB — CBC WITH DIFFERENTIAL (CANCER CENTER ONLY)
Abs Immature Granulocytes: 2.2 10*3/uL — ABNORMAL HIGH (ref 0.00–0.07)
Basophils Absolute: 0.4 10*3/uL — ABNORMAL HIGH (ref 0.0–0.1)
Basophils Relative: 2 %
Eosinophils Absolute: 0 10*3/uL (ref 0.0–0.5)
Eosinophils Relative: 0 %
HCT: 27.7 % — ABNORMAL LOW (ref 39.0–52.0)
Hemoglobin: 9.2 g/dL — ABNORMAL LOW (ref 13.0–17.0)
Immature Granulocytes: 11 %
Lymphocytes Relative: 13 %
Lymphs Abs: 2.6 10*3/uL (ref 0.7–4.0)
MCH: 35.2 pg — ABNORMAL HIGH (ref 26.0–34.0)
MCHC: 33.2 g/dL (ref 30.0–36.0)
MCV: 106.1 fL — ABNORMAL HIGH (ref 80.0–100.0)
Monocytes Absolute: 2.7 10*3/uL — ABNORMAL HIGH (ref 0.1–1.0)
Monocytes Relative: 13 %
Neutro Abs: 12.2 10*3/uL — ABNORMAL HIGH (ref 1.7–7.7)
Neutrophils Relative %: 61 %
Platelet Count: 343 10*3/uL (ref 150–400)
RBC: 2.61 MIL/uL — ABNORMAL LOW (ref 4.22–5.81)
RDW: 20.9 % — ABNORMAL HIGH (ref 11.5–15.5)
Smear Review: NORMAL
WBC Count: 20.1 10*3/uL — ABNORMAL HIGH (ref 4.0–10.5)
nRBC: 0.9 % — ABNORMAL HIGH (ref 0.0–0.2)

## 2021-04-21 LAB — CMP (CANCER CENTER ONLY)
ALT: 15 U/L (ref 0–44)
AST: 24 U/L (ref 15–41)
Albumin: 3.7 g/dL (ref 3.5–5.0)
Alkaline Phosphatase: 76 U/L (ref 38–126)
Anion gap: 9 (ref 5–15)
BUN: 30 mg/dL — ABNORMAL HIGH (ref 8–23)
CO2: 23 mmol/L (ref 22–32)
Calcium: 8.6 mg/dL — ABNORMAL LOW (ref 8.9–10.3)
Chloride: 108 mmol/L (ref 98–111)
Creatinine: 1.71 mg/dL — ABNORMAL HIGH (ref 0.61–1.24)
GFR, Estimated: 38 mL/min — ABNORMAL LOW (ref >60)
Glucose, Bld: 117 mg/dL — ABNORMAL HIGH (ref 70–99)
Potassium: 4.1 mmol/L (ref 3.5–5.1)
Sodium: 140 mmol/L (ref 135–145)
Total Bilirubin: 0.7 mg/dL (ref 0.3–1.2)
Total Protein: 7 g/dL (ref 6.5–8.1)

## 2021-04-21 MED ORDER — DARBEPOETIN ALFA 500 MCG/ML IJ SOSY
500.0000 ug | PREFILLED_SYRINGE | Freq: Once | INTRAMUSCULAR | Status: AC
  Administered 2021-04-21: 500 ug via SUBCUTANEOUS
  Filled 2021-04-21: qty 1

## 2021-04-21 MED ORDER — SODIUM CHLORIDE 0.9 % IV SOLN: Freq: Once | INTRAVENOUS | Status: AC

## 2021-04-21 NOTE — Progress Notes (Signed)
No note

## 2021-04-21 NOTE — Patient Instructions (Signed)
Dehydration, Adult Dehydration is a condition in which there is not enough water or other fluids in the body. This happens when a person loses more fluids than he or she takes in. Important organs, such as the kidneys, brain, and heart, cannot function without a proper amount of fluids. Any loss of fluids from the body can lead to dehydration. Dehydration can be mild, moderate, or severe. It should be treated right away to prevent it from becoming severe. What are the causes? Dehydration may be caused by: Conditions that cause loss of water or other fluids, such as diarrhea, vomiting, or sweating or urinating a lot. Not drinking enough fluids, especially when you are ill or doing activities that require a lot of energy. Other illnesses and conditions, such as fever or infection. Certain medicines, such as medicines that remove excess fluid from the body (diuretics). Lack of safe drinking water. Not being able to get enough water and food. What increases the risk? The following factors may make you more likely to develop this condition: Having a long-term (chronic) illness that has not been treated properly, such as diabetes, heart disease, or kidney disease. Being 65 years of age or older. Having a disability. Living in a place that is high in altitude, where thinner, drier air causes more fluid loss. Doing exercises that put stress on your body for a long time (endurance sports). What are the signs or symptoms? Symptoms of dehydration depend on how severe it is. Mild or moderate dehydration Thirst. Dry lips or dry mouth. Dizziness or light-headedness, especially when standing up from a seated position. Muscle cramps. Dark urine. Urine may be the color of tea. Less urine or tears produced than usual. Headache. Severe dehydration Changes in skin. Your skin may be cold and clammy, blotchy, or pale. Your skin also may not return to normal after being lightly pinched and released. Little or  no tears, urine, or sweat. Changes in vital signs, such as rapid breathing and low blood pressure. Your pulse may be weak or may be faster than 100 beats a minute when you are sitting still. Other changes, such as: Feeling very thirsty. Sunken eyes. Cold hands and feet. Confusion. Being very tired (lethargic) or having trouble waking from sleep. Short-term weight loss. Loss of consciousness. How is this diagnosed? This condition is diagnosed based on your symptoms and a physical exam. You may have blood and urine tests to help confirm the diagnosis. How is this treated? Treatment for this condition depends on how severe it is. Treatment should be started right away. Do not wait until dehydration becomes severe. Severe dehydration is an emergency and needs to be treated in a hospital. Mild or moderate dehydration can be treated at home. You may be asked to: Drink more fluids. Drink an oral rehydration solution (ORS). This drink helps restore proper amounts of fluids and salts and minerals in the blood (electrolytes). Severe dehydration can be treated: With IV fluids. By correcting abnormal levels of electrolytes. This is often done by giving electrolytes through a tube that is passed through your nose and into your stomach (nasogastric tube, or NG tube). By treating the underlying cause of dehydration. Follow these instructions at home: Oral rehydration solution If told by your health care provider, drink an ORS: Make an ORS by following instructions on the package. Start by drinking small amounts, about  cup (120 mL) every 5-10 minutes. Slowly increase how much you drink until you have taken the amount recommended by your health   care provider. Eating and drinking     Drink enough clear fluid to keep your urine pale yellow. If you were told to drink an ORS, finish the ORS first and then start slowly drinking other clear fluids. Drink fluids such as: Water. Do not drink only water.  Doing that can lead to hyponatremia, which is having too little salt (sodium) in the body. Water from ice chips you suck on. Fruit juice that you have added water to (diluted fruit juice). Low-calorie sports drinks. Eat foods that contain a healthy balance of electrolytes, such as bananas, oranges, potatoes, tomatoes, and spinach. Do not drink alcohol. Avoid the following: Drinks that contain a lot of sugar. These include high-calorie sports drinks, fruit juice that is not diluted, and soda. Caffeine. Foods that are greasy or contain a lot of fat or sugar. General instructions Take over-the-counter and prescription medicines only as told by your health care provider. Do not take sodium tablets. Doing that can lead to having too much sodium in the body (hypernatremia). Return to your normal activities as told by your health care provider. Ask your health care provider what activities are safe for you. Keep all follow-up visits as told by your health care provider. This is important. Contact a health care provider if: You have muscle cramps, pain, or discomfort, such as: Pain in your abdomen and the pain gets worse or stays in one area (localizes). Stiff neck. You have a rash. You are more irritable than usual. You are sleepier or have a harder time waking than usual. You feel weak or dizzy. You feel very thirsty. Get help right away if you have: Any symptoms of severe dehydration. Symptoms of vomiting, such as: You cannot eat or drink without vomiting. Vomiting gets worse or does not go away. Vomit includes blood or green matter (bile). Symptoms that get worse with treatment. A fever. A severe headache. Problems with urination or bowel movements, such as: Diarrhea that gets worse or does not go away. Blood in your stool (feces). This may cause stool to look black and tarry. Not urinating, or urinating only a small amount of very dark urine, within 6-8 hours. Trouble  breathing. These symptoms may represent a serious problem that is an emergency. Do not wait to see if the symptoms will go away. Get medical help right away. Call your local emergency services (911 in the [place] Do not drive yourself to the hospital. Summary Dehydration is a condition in which there is not enough water or other fluids in the body. This happens when a person loses more fluids than he or she takes in. Treatment for this condition depends on how severe it is. Treatment should be started right away. Do not wait until dehydration becomes severe. Drink enough clear fluid to keep your urine pale yellow. If you were told to drink an oral rehydration solution (ORS), finish the ORS first and then start slowly drinking other clear fluids. Take over-the-counter and prescription medicines only as told by your health care provider. Get help right away if you have any symptoms of severe dehydration. This information is not intended to replace advice given to you by your health care provider. Make sure you discuss any questions you have with your health care provider. Document Revised: 01/11/2019 Document Reviewed: 01/11/2019 Elsevier Patient Education  [place].  Darbepoetin Alfa injection What is this medication? DARBEPOETIN ALFA (dar be POE e tin  AL fa) helps your body make more red blood cells. It  is used to treat anemia caused by chronic kidney failure and chemotherapy. This medicine may be used for other purposes; ask your health care provider or pharmacist if you have questions. COMMON BRAND NAME(S): Aranesp What should I tell my care team before I take this medication? They need to know if you have any of these conditions: blood clotting disorders or history of blood clots cancer patient not on chemotherapy cystic fibrosis heart disease, such as angina, heart failure, or a history of a heart attack hemoglobin level of 12 g/dL or greater high blood pressure low levels of  folate, iron, or vitamin B12 seizures an unusual or allergic reaction to darbepoetin, erythropoietin, albumin, hamster proteins, latex, other medicines, foods, dyes, or preservatives pregnant or trying to get pregnant breast-feeding How should I use this medication? This medicine is for injection into a vein or under the skin. It is usually given by a health care professional in a hospital or clinic setting. If you get this medicine at home, you will be taught how to prepare and give this medicine. Use exactly as directed. Take your medicine at regular intervals. Do not take your medicine more often than directed. It is important that you put your used needles and syringes in a special sharps container. Do not put them in a trash can. If you do not have a sharps container, call your pharmacist or healthcare provider to get one. A special MedGuide will be given to you by the pharmacist with each prescription and refill. Be sure to read this information carefully each time. Talk to your pediatrician regarding the use of this medicine in children. While this medicine may be used in children as young as 65 month of age for selected conditions, precautions do apply. Overdosage: If you think you have taken too much of this medicine contact a poison control center or emergency room at once. NOTE: This medicine is only for you. Do not share this medicine with others. What if I miss a dose? If you miss a dose, take it as soon as you can. If it is almost time for your next dose, take only that dose. Do not take double or extra doses. What may interact with this medication? Do not take this medicine with any of the following medications: epoetin alfa This list may not describe all possible interactions. Give your health care provider a list of all the medicines, herbs, non-prescription drugs, or dietary supplements you use. Also tell them if you smoke, drink alcohol, or use illegal drugs. Some items may  interact with your medicine. What should I watch for while using this medication? Your condition will be monitored carefully while you are receiving this medicine. You may need blood work done while you are taking this medicine. This medicine may cause a decrease in vitamin B6. You should make sure that you get enough vitamin B6 while you are taking this medicine. Discuss the foods you eat and the vitamins you take with your health care professional. What side effects may I notice from receiving this medication? Side effects that you should report to your doctor or health care professional as soon as possible: allergic reactions like skin rash, itching or hives, swelling of the face, lips, or tongue breathing problems changes in vision chest pain confusion, trouble speaking or understanding feeling faint or lightheaded, falls high blood pressure muscle aches or pains pain, swelling, warmth in the leg rapid weight gain severe headaches sudden numbness or weakness of the face, arm or  leg trouble walking, dizziness, loss of balance or coordination seizures (convulsions) swelling of the ankles, feet, hands unusually weak or tired Side effects that usually do not require medical attention (report to your doctor or health care professional if they continue or are bothersome): diarrhea fever, chills (flu-like symptoms) headaches nausea, vomiting redness, stinging, or swelling at site where injected This list may not describe all possible side effects. Call your doctor for medical advice about side effects. You may report side effects to FDA at 1-800-FDA-1088. Where should I keep my medication? Keep out of the reach of children. Store in a refrigerator between 2 and 8 degrees C (36 and 46 degrees F). Do not freeze. Do not shake. Throw away any unused portion if using a single-dose vial. Throw away any unused medicine after the expiration date. NOTE: This sheet is a summary. It may not cover  all possible information. If you have questions about this medicine, talk to your doctor, pharmacist, or health care provider.  2022 Elsevier/[employer] Standard (2017-06-20 00:00:00)

## 2021-04-21 NOTE — Telephone Encounter (Signed)
Referral called to [facility] 

## 2021-04-23 ENCOUNTER — Telehealth: Payer: Self-pay

## 2021-04-23 NOTE — Telephone Encounter (Signed)
Per MD request, I attempted to call [facility], pt's daughters to check the status of hospice care for pt.Per hospice, pt will complete admission 11/15 at 1400. MD made aware.

## 2021-04-28 DIAGNOSIS — J302 Other seasonal allergic rhinitis: Secondary | ICD-10-CM | POA: Diagnosis not present

## 2021-04-28 DIAGNOSIS — N189 Chronic kidney disease, unspecified: Secondary | ICD-10-CM | POA: Diagnosis not present

## 2021-04-28 DIAGNOSIS — D631 Anemia in chronic kidney disease: Secondary | ICD-10-CM | POA: Diagnosis not present

## 2021-04-28 DIAGNOSIS — Z681 Body mass index (BMI) 19 or less, adult: Secondary | ICD-10-CM | POA: Diagnosis not present

## 2021-04-28 DIAGNOSIS — H3530 Unspecified macular degeneration: Secondary | ICD-10-CM | POA: Diagnosis not present

## 2021-04-28 DIAGNOSIS — D471 Chronic myeloproliferative disease: Secondary | ICD-10-CM | POA: Diagnosis not present

## 2021-04-28 DIAGNOSIS — D75839 Thrombocytosis, unspecified: Secondary | ICD-10-CM | POA: Diagnosis not present

## 2021-04-28 DIAGNOSIS — F32A Depression, unspecified: Secondary | ICD-10-CM | POA: Diagnosis not present

## 2021-04-28 DIAGNOSIS — N400 Enlarged prostate without lower urinary tract symptoms: Secondary | ICD-10-CM | POA: Diagnosis not present

## 2021-04-28 DIAGNOSIS — E46 Unspecified protein-calorie malnutrition: Secondary | ICD-10-CM | POA: Diagnosis not present

## 2021-04-30 DIAGNOSIS — N189 Chronic kidney disease, unspecified: Secondary | ICD-10-CM | POA: Diagnosis not present

## 2021-04-30 DIAGNOSIS — D631 Anemia in chronic kidney disease: Secondary | ICD-10-CM | POA: Diagnosis not present

## 2021-04-30 DIAGNOSIS — F32A Depression, unspecified: Secondary | ICD-10-CM | POA: Diagnosis not present

## 2021-04-30 DIAGNOSIS — D75839 Thrombocytosis, unspecified: Secondary | ICD-10-CM | POA: Diagnosis not present

## 2021-04-30 DIAGNOSIS — E46 Unspecified protein-calorie malnutrition: Secondary | ICD-10-CM | POA: Diagnosis not present

## 2021-04-30 DIAGNOSIS — D471 Chronic myeloproliferative disease: Secondary | ICD-10-CM | POA: Diagnosis not present

## 2021-05-05 DIAGNOSIS — D75839 Thrombocytosis, unspecified: Secondary | ICD-10-CM | POA: Diagnosis not present

## 2021-05-05 DIAGNOSIS — N189 Chronic kidney disease, unspecified: Secondary | ICD-10-CM | POA: Diagnosis not present

## 2021-05-05 DIAGNOSIS — F32A Depression, unspecified: Secondary | ICD-10-CM | POA: Diagnosis not present

## 2021-05-05 DIAGNOSIS — E46 Unspecified protein-calorie malnutrition: Secondary | ICD-10-CM | POA: Diagnosis not present

## 2021-05-05 DIAGNOSIS — D471 Chronic myeloproliferative disease: Secondary | ICD-10-CM | POA: Diagnosis not present

## 2021-05-05 DIAGNOSIS — D631 Anemia in chronic kidney disease: Secondary | ICD-10-CM | POA: Diagnosis not present

## 2021-05-08 DIAGNOSIS — D631 Anemia in chronic kidney disease: Secondary | ICD-10-CM | POA: Diagnosis not present

## 2021-05-08 DIAGNOSIS — N189 Chronic kidney disease, unspecified: Secondary | ICD-10-CM | POA: Diagnosis not present

## 2021-05-08 DIAGNOSIS — D75839 Thrombocytosis, unspecified: Secondary | ICD-10-CM | POA: Diagnosis not present

## 2021-05-08 DIAGNOSIS — E46 Unspecified protein-calorie malnutrition: Secondary | ICD-10-CM | POA: Diagnosis not present

## 2021-05-08 DIAGNOSIS — D471 Chronic myeloproliferative disease: Secondary | ICD-10-CM | POA: Diagnosis not present

## 2021-05-08 DIAGNOSIS — F32A Depression, unspecified: Secondary | ICD-10-CM | POA: Diagnosis not present

## 2021-05-12 DIAGNOSIS — N189 Chronic kidney disease, unspecified: Secondary | ICD-10-CM | POA: Diagnosis not present

## 2021-05-12 DIAGNOSIS — D471 Chronic myeloproliferative disease: Secondary | ICD-10-CM | POA: Diagnosis not present

## 2021-05-12 DIAGNOSIS — D631 Anemia in chronic kidney disease: Secondary | ICD-10-CM | POA: Diagnosis not present

## 2021-05-12 DIAGNOSIS — F32A Depression, unspecified: Secondary | ICD-10-CM | POA: Diagnosis not present

## 2021-05-12 DIAGNOSIS — D75839 Thrombocytosis, unspecified: Secondary | ICD-10-CM | POA: Diagnosis not present

## 2021-05-12 DIAGNOSIS — E46 Unspecified protein-calorie malnutrition: Secondary | ICD-10-CM | POA: Diagnosis not present

## 2021-05-14 ENCOUNTER — Other Ambulatory Visit: Payer: Self-pay | Admitting: Oncology

## 2021-05-14 DIAGNOSIS — D631 Anemia in chronic kidney disease: Secondary | ICD-10-CM | POA: Diagnosis not present

## 2021-05-14 DIAGNOSIS — N189 Chronic kidney disease, unspecified: Secondary | ICD-10-CM | POA: Diagnosis not present

## 2021-05-14 DIAGNOSIS — F32A Depression, unspecified: Secondary | ICD-10-CM | POA: Diagnosis not present

## 2021-05-14 DIAGNOSIS — H3530 Unspecified macular degeneration: Secondary | ICD-10-CM | POA: Diagnosis not present

## 2021-05-14 DIAGNOSIS — Z681 Body mass index (BMI) 19 or less, adult: Secondary | ICD-10-CM | POA: Diagnosis not present

## 2021-05-14 DIAGNOSIS — E46 Unspecified protein-calorie malnutrition: Secondary | ICD-10-CM | POA: Diagnosis not present

## 2021-05-14 DIAGNOSIS — D471 Chronic myeloproliferative disease: Secondary | ICD-10-CM | POA: Diagnosis not present

## 2021-05-14 DIAGNOSIS — J302 Other seasonal allergic rhinitis: Secondary | ICD-10-CM | POA: Diagnosis not present

## 2021-05-14 DIAGNOSIS — N400 Enlarged prostate without lower urinary tract symptoms: Secondary | ICD-10-CM | POA: Diagnosis not present

## 2021-05-14 DIAGNOSIS — D75839 Thrombocytosis, unspecified: Secondary | ICD-10-CM | POA: Diagnosis not present

## 2021-05-14 NOTE — Progress Notes (Signed)
CKD stage 3B  Thanks  GM

## 2021-05-19 ENCOUNTER — Ambulatory Visit: Payer: Medicare Other

## 2021-05-19 ENCOUNTER — Other Ambulatory Visit: Payer: Medicare Other

## 2021-05-19 ENCOUNTER — Ambulatory Visit: Payer: Medicare Other | Admitting: Adult Health

## 2021-05-19 DIAGNOSIS — D75839 Thrombocytosis, unspecified: Secondary | ICD-10-CM | POA: Diagnosis not present

## 2021-05-19 DIAGNOSIS — E46 Unspecified protein-calorie malnutrition: Secondary | ICD-10-CM | POA: Diagnosis not present

## 2021-05-19 DIAGNOSIS — F32A Depression, unspecified: Secondary | ICD-10-CM | POA: Diagnosis not present

## 2021-05-19 DIAGNOSIS — D631 Anemia in chronic kidney disease: Secondary | ICD-10-CM | POA: Diagnosis not present

## 2021-05-19 DIAGNOSIS — D471 Chronic myeloproliferative disease: Secondary | ICD-10-CM | POA: Diagnosis not present

## 2021-05-19 DIAGNOSIS — N189 Chronic kidney disease, unspecified: Secondary | ICD-10-CM | POA: Diagnosis not present

## 2021-05-24 ENCOUNTER — Other Ambulatory Visit: Payer: Self-pay | Admitting: Family Medicine

## 2021-05-25 DIAGNOSIS — D631 Anemia in chronic kidney disease: Secondary | ICD-10-CM | POA: Diagnosis not present

## 2021-05-25 DIAGNOSIS — F32A Depression, unspecified: Secondary | ICD-10-CM | POA: Diagnosis not present

## 2021-05-25 DIAGNOSIS — E46 Unspecified protein-calorie malnutrition: Secondary | ICD-10-CM | POA: Diagnosis not present

## 2021-05-25 DIAGNOSIS — D75839 Thrombocytosis, unspecified: Secondary | ICD-10-CM | POA: Diagnosis not present

## 2021-05-25 DIAGNOSIS — N189 Chronic kidney disease, unspecified: Secondary | ICD-10-CM | POA: Diagnosis not present

## 2021-05-25 DIAGNOSIS — D471 Chronic myeloproliferative disease: Secondary | ICD-10-CM | POA: Diagnosis not present

## 2021-05-26 DIAGNOSIS — D75839 Thrombocytosis, unspecified: Secondary | ICD-10-CM | POA: Diagnosis not present

## 2021-05-26 DIAGNOSIS — E46 Unspecified protein-calorie malnutrition: Secondary | ICD-10-CM | POA: Diagnosis not present

## 2021-05-26 DIAGNOSIS — F32A Depression, unspecified: Secondary | ICD-10-CM | POA: Diagnosis not present

## 2021-05-26 DIAGNOSIS — D631 Anemia in chronic kidney disease: Secondary | ICD-10-CM | POA: Diagnosis not present

## 2021-05-26 DIAGNOSIS — D471 Chronic myeloproliferative disease: Secondary | ICD-10-CM | POA: Diagnosis not present

## 2021-05-26 DIAGNOSIS — N189 Chronic kidney disease, unspecified: Secondary | ICD-10-CM | POA: Diagnosis not present

## 2021-06-02 DIAGNOSIS — D471 Chronic myeloproliferative disease: Secondary | ICD-10-CM | POA: Diagnosis not present

## 2021-06-02 DIAGNOSIS — D75839 Thrombocytosis, unspecified: Secondary | ICD-10-CM | POA: Diagnosis not present

## 2021-06-02 DIAGNOSIS — F32A Depression, unspecified: Secondary | ICD-10-CM | POA: Diagnosis not present

## 2021-06-02 DIAGNOSIS — N189 Chronic kidney disease, unspecified: Secondary | ICD-10-CM | POA: Diagnosis not present

## 2021-06-02 DIAGNOSIS — D631 Anemia in chronic kidney disease: Secondary | ICD-10-CM | POA: Diagnosis not present

## 2021-06-02 DIAGNOSIS — E46 Unspecified protein-calorie malnutrition: Secondary | ICD-10-CM | POA: Diagnosis not present

## 2021-06-09 DIAGNOSIS — D75839 Thrombocytosis, unspecified: Secondary | ICD-10-CM | POA: Diagnosis not present

## 2021-06-09 DIAGNOSIS — F32A Depression, unspecified: Secondary | ICD-10-CM | POA: Diagnosis not present

## 2021-06-09 DIAGNOSIS — D631 Anemia in chronic kidney disease: Secondary | ICD-10-CM | POA: Diagnosis not present

## 2021-06-09 DIAGNOSIS — E46 Unspecified protein-calorie malnutrition: Secondary | ICD-10-CM | POA: Diagnosis not present

## 2021-06-09 DIAGNOSIS — N189 Chronic kidney disease, unspecified: Secondary | ICD-10-CM | POA: Diagnosis not present

## 2021-06-09 DIAGNOSIS — D471 Chronic myeloproliferative disease: Secondary | ICD-10-CM | POA: Diagnosis not present

## 2021-06-10 DIAGNOSIS — E46 Unspecified protein-calorie malnutrition: Secondary | ICD-10-CM | POA: Diagnosis not present

## 2021-06-10 DIAGNOSIS — D471 Chronic myeloproliferative disease: Secondary | ICD-10-CM | POA: Diagnosis not present

## 2021-06-10 DIAGNOSIS — D631 Anemia in chronic kidney disease: Secondary | ICD-10-CM | POA: Diagnosis not present

## 2021-06-10 DIAGNOSIS — N189 Chronic kidney disease, unspecified: Secondary | ICD-10-CM | POA: Diagnosis not present

## 2021-06-10 DIAGNOSIS — D75839 Thrombocytosis, unspecified: Secondary | ICD-10-CM | POA: Diagnosis not present

## 2021-06-10 DIAGNOSIS — F32A Depression, unspecified: Secondary | ICD-10-CM | POA: Diagnosis not present

## 2021-06-11 DIAGNOSIS — D471 Chronic myeloproliferative disease: Secondary | ICD-10-CM | POA: Diagnosis not present

## 2021-06-11 DIAGNOSIS — D631 Anemia in chronic kidney disease: Secondary | ICD-10-CM | POA: Diagnosis not present

## 2021-06-11 DIAGNOSIS — N189 Chronic kidney disease, unspecified: Secondary | ICD-10-CM | POA: Diagnosis not present

## 2021-06-11 DIAGNOSIS — D75839 Thrombocytosis, unspecified: Secondary | ICD-10-CM | POA: Diagnosis not present

## 2021-06-11 DIAGNOSIS — E46 Unspecified protein-calorie malnutrition: Secondary | ICD-10-CM | POA: Diagnosis not present

## 2021-06-11 DIAGNOSIS — F32A Depression, unspecified: Secondary | ICD-10-CM | POA: Diagnosis not present

## 2021-06-14 DEATH — deceased
